# Patient Record
Sex: Female | Born: 1960 | Race: White | Hispanic: No | Marital: Married | Smoking: Never smoker
Health system: Southern US, Community
[De-identification: ages and names within clinical notes are randomized; demographics above are authoritative.]

## PROBLEM LIST (undated history)

## (undated) DIAGNOSIS — C439 Malignant melanoma of skin, unspecified: Secondary | ICD-10-CM

## (undated) DIAGNOSIS — K5790 Diverticulosis of intestine, part unspecified, without perforation or abscess without bleeding: Secondary | ICD-10-CM

## (undated) DIAGNOSIS — E282 Polycystic ovarian syndrome: Secondary | ICD-10-CM

## (undated) DIAGNOSIS — E785 Hyperlipidemia, unspecified: Secondary | ICD-10-CM

## (undated) DIAGNOSIS — R609 Edema, unspecified: Secondary | ICD-10-CM

## (undated) DIAGNOSIS — D696 Thrombocytopenia, unspecified: Secondary | ICD-10-CM

## (undated) DIAGNOSIS — D649 Anemia, unspecified: Secondary | ICD-10-CM

## (undated) DIAGNOSIS — K224 Dyskinesia of esophagus: Secondary | ICD-10-CM

## (undated) DIAGNOSIS — K829 Disease of gallbladder, unspecified: Secondary | ICD-10-CM

## (undated) DIAGNOSIS — C4491 Basal cell carcinoma of skin, unspecified: Secondary | ICD-10-CM

## (undated) DIAGNOSIS — F419 Anxiety disorder, unspecified: Secondary | ICD-10-CM

## (undated) DIAGNOSIS — K219 Gastro-esophageal reflux disease without esophagitis: Secondary | ICD-10-CM

## (undated) DIAGNOSIS — K589 Irritable bowel syndrome without diarrhea: Secondary | ICD-10-CM

## (undated) DIAGNOSIS — M549 Dorsalgia, unspecified: Secondary | ICD-10-CM

## (undated) DIAGNOSIS — R32 Unspecified urinary incontinence: Secondary | ICD-10-CM

## (undated) DIAGNOSIS — M722 Plantar fascial fibromatosis: Secondary | ICD-10-CM

## (undated) HISTORY — DX: Hyperlipidemia, unspecified: E78.5

## (undated) HISTORY — DX: Polycystic ovarian syndrome: E28.2

## (undated) HISTORY — DX: Disease of gallbladder, unspecified: K82.9

## (undated) HISTORY — PX: MELANOMA EXCISION: SHX5266

## (undated) HISTORY — DX: Diverticulosis of intestine, part unspecified, without perforation or abscess without bleeding: K57.90

## (undated) HISTORY — DX: Thrombocytopenia, unspecified: D69.6

## (undated) HISTORY — DX: Malignant melanoma of skin, unspecified: C43.9

## (undated) HISTORY — DX: Irritable bowel syndrome, unspecified: K58.9

## (undated) HISTORY — PX: TONSILLECTOMY: SUR1361

## (undated) HISTORY — DX: Gastro-esophageal reflux disease without esophagitis: K21.9

## (undated) HISTORY — DX: Edema, unspecified: R60.9

## (undated) HISTORY — DX: Plantar fascial fibromatosis: M72.2

## (undated) HISTORY — PX: SHOULDER SURGERY: SHX246

## (undated) HISTORY — DX: Unspecified urinary incontinence: R32

## (undated) HISTORY — PX: COLONOSCOPY: SHX174

## (undated) HISTORY — DX: Dyskinesia of esophagus: K22.4

## (undated) HISTORY — DX: Basal cell carcinoma of skin, unspecified: C44.91

## (undated) HISTORY — DX: Dorsalgia, unspecified: M54.9

## (undated) HISTORY — DX: Anxiety disorder, unspecified: F41.9

## (undated) HISTORY — DX: Anemia, unspecified: D64.9

---

## 1989-02-03 HISTORY — PX: TOTAL ABDOMINAL HYSTERECTOMY: SHX209

## 1999-01-26 ENCOUNTER — Encounter: Payer: Self-pay | Admitting: Emergency Medicine

## 1999-01-26 ENCOUNTER — Emergency Department (HOSPITAL_COMMUNITY): Admission: EM | Admit: 1999-01-26 | Discharge: 1999-01-26 | Payer: Self-pay | Admitting: Emergency Medicine

## 1999-01-27 ENCOUNTER — Ambulatory Visit (HOSPITAL_COMMUNITY): Admission: RE | Admit: 1999-01-27 | Discharge: 1999-01-27 | Payer: Self-pay | Admitting: Emergency Medicine

## 1999-01-27 ENCOUNTER — Encounter: Payer: Self-pay | Admitting: Emergency Medicine

## 1999-03-12 ENCOUNTER — Encounter: Payer: Self-pay | Admitting: Gastroenterology

## 1999-03-12 ENCOUNTER — Ambulatory Visit (HOSPITAL_COMMUNITY): Admission: RE | Admit: 1999-03-12 | Discharge: 1999-03-12 | Payer: Self-pay | Admitting: Gastroenterology

## 1999-04-12 ENCOUNTER — Ambulatory Visit (HOSPITAL_COMMUNITY): Admission: RE | Admit: 1999-04-12 | Discharge: 1999-04-12 | Payer: Self-pay | Admitting: Family Medicine

## 1999-04-12 ENCOUNTER — Encounter: Payer: Self-pay | Admitting: Family Medicine

## 2000-02-04 DIAGNOSIS — D696 Thrombocytopenia, unspecified: Secondary | ICD-10-CM

## 2000-02-04 HISTORY — PX: CHOLECYSTECTOMY: SHX55

## 2000-02-04 HISTORY — DX: Thrombocytopenia, unspecified: D69.6

## 2000-06-29 ENCOUNTER — Emergency Department (HOSPITAL_COMMUNITY): Admission: EM | Admit: 2000-06-29 | Discharge: 2000-06-29 | Payer: Self-pay | Admitting: Emergency Medicine

## 2000-06-29 ENCOUNTER — Encounter: Payer: Self-pay | Admitting: Emergency Medicine

## 2000-07-10 ENCOUNTER — Ambulatory Visit (HOSPITAL_COMMUNITY): Admission: RE | Admit: 2000-07-10 | Discharge: 2000-07-11 | Payer: Self-pay | Admitting: *Deleted

## 2000-07-10 ENCOUNTER — Encounter (INDEPENDENT_AMBULATORY_CARE_PROVIDER_SITE_OTHER): Payer: Self-pay | Admitting: *Deleted

## 2001-07-14 ENCOUNTER — Other Ambulatory Visit: Admission: RE | Admit: 2001-07-14 | Discharge: 2001-07-14 | Payer: Self-pay | Admitting: Obstetrics & Gynecology

## 2002-02-03 DIAGNOSIS — D649 Anemia, unspecified: Secondary | ICD-10-CM

## 2002-02-03 HISTORY — DX: Anemia, unspecified: D64.9

## 2004-02-04 DIAGNOSIS — C439 Malignant melanoma of skin, unspecified: Secondary | ICD-10-CM

## 2004-02-04 HISTORY — DX: Malignant melanoma of skin, unspecified: C43.9

## 2004-04-19 ENCOUNTER — Ambulatory Visit: Payer: Self-pay | Admitting: Internal Medicine

## 2004-10-08 ENCOUNTER — Ambulatory Visit: Payer: Self-pay | Admitting: Internal Medicine

## 2004-12-18 ENCOUNTER — Ambulatory Visit (HOSPITAL_COMMUNITY): Admission: RE | Admit: 2004-12-18 | Discharge: 2004-12-18 | Payer: Self-pay | Admitting: General Surgery

## 2004-12-18 ENCOUNTER — Ambulatory Visit (HOSPITAL_BASED_OUTPATIENT_CLINIC_OR_DEPARTMENT_OTHER): Admission: RE | Admit: 2004-12-18 | Discharge: 2004-12-18 | Payer: Self-pay | Admitting: General Surgery

## 2004-12-18 ENCOUNTER — Encounter (INDEPENDENT_AMBULATORY_CARE_PROVIDER_SITE_OTHER): Payer: Self-pay | Admitting: *Deleted

## 2005-01-17 ENCOUNTER — Ambulatory Visit: Payer: Self-pay | Admitting: Oncology

## 2005-04-30 ENCOUNTER — Ambulatory Visit: Payer: Self-pay | Admitting: Internal Medicine

## 2005-05-19 ENCOUNTER — Encounter: Payer: Self-pay | Admitting: Internal Medicine

## 2005-06-23 ENCOUNTER — Ambulatory Visit: Payer: Self-pay | Admitting: Internal Medicine

## 2005-07-14 ENCOUNTER — Ambulatory Visit: Payer: Self-pay | Admitting: Oncology

## 2005-07-16 LAB — COMPREHENSIVE METABOLIC PANEL
ALT: 8 U/L (ref 0–40)
CO2: 29 mEq/L (ref 19–32)
Calcium: 8.9 mg/dL (ref 8.4–10.5)
Chloride: 102 mEq/L (ref 96–112)
Creatinine, Ser: 0.59 mg/dL (ref 0.40–1.20)
Glucose, Bld: 61 mg/dL — ABNORMAL LOW (ref 70–99)
Total Protein: 6.9 g/dL (ref 6.0–8.3)

## 2005-07-16 LAB — CBC WITH DIFFERENTIAL/PLATELET
BASO%: 0.6 % (ref 0.0–2.0)
Eosinophils Absolute: 0.2 10*3/uL (ref 0.0–0.5)
HCT: 42.1 % (ref 34.8–46.6)
HGB: 14.1 g/dL (ref 11.6–15.9)
MCHC: 33.6 g/dL (ref 32.0–36.0)
MONO#: 0.4 10*3/uL (ref 0.1–0.9)
NEUT#: 4.3 10*3/uL (ref 1.5–6.5)
NEUT%: 62.7 % (ref 39.6–76.8)
WBC: 6.9 10*3/uL (ref 3.9–10.0)
lymph#: 1.9 10*3/uL (ref 0.9–3.3)

## 2005-07-16 LAB — LACTATE DEHYDROGENASE: LDH: 102 U/L (ref 94–250)

## 2005-10-09 ENCOUNTER — Ambulatory Visit: Payer: Self-pay | Admitting: Internal Medicine

## 2005-11-21 ENCOUNTER — Ambulatory Visit: Payer: Self-pay | Admitting: Oncology

## 2005-11-25 LAB — COMPREHENSIVE METABOLIC PANEL
Alkaline Phosphatase: 36 U/L — ABNORMAL LOW (ref 39–117)
BUN: 11 mg/dL (ref 6–23)
CO2: 28 mEq/L (ref 19–32)
Creatinine, Ser: 0.62 mg/dL (ref 0.40–1.20)
Glucose, Bld: 61 mg/dL — ABNORMAL LOW (ref 70–99)
Total Bilirubin: 0.3 mg/dL (ref 0.3–1.2)

## 2005-11-25 LAB — CBC WITH DIFFERENTIAL/PLATELET
Basophils Absolute: 0.1 10*3/uL (ref 0.0–0.1)
Eosinophils Absolute: 0.2 10*3/uL (ref 0.0–0.5)
HCT: 40.2 % (ref 34.8–46.6)
HGB: 13.6 g/dL (ref 11.6–15.9)
LYMPH%: 22.8 % (ref 14.0–48.0)
MONO#: 0.4 10*3/uL (ref 0.1–0.9)
NEUT#: 4.8 10*3/uL (ref 1.5–6.5)
NEUT%: 67.7 % (ref 39.6–76.8)
Platelets: 189 10*3/uL (ref 145–400)
WBC: 7 10*3/uL (ref 3.9–10.0)
lymph#: 1.6 10*3/uL (ref 0.9–3.3)

## 2005-11-25 LAB — LACTATE DEHYDROGENASE: LDH: 90 U/L — ABNORMAL LOW (ref 94–250)

## 2006-09-14 ENCOUNTER — Ambulatory Visit: Payer: Self-pay | Admitting: Oncology

## 2006-09-17 LAB — CBC WITH DIFFERENTIAL/PLATELET
BASO%: 0.5 % (ref 0.0–2.0)
EOS%: 3.2 % (ref 0.0–7.0)
HGB: 14.6 g/dL (ref 11.6–15.9)
MCH: 30.7 pg (ref 26.0–34.0)
MCHC: 35 g/dL (ref 32.0–36.0)
RDW: 13.4 % (ref 11.3–14.5)
lymph#: 1.6 10*3/uL (ref 0.9–3.3)

## 2006-09-17 LAB — LACTATE DEHYDROGENASE: LDH: 110 U/L (ref 94–250)

## 2006-09-17 LAB — COMPREHENSIVE METABOLIC PANEL
ALT: 10 U/L (ref 0–35)
CO2: 26 mEq/L (ref 19–32)
Calcium: 8.7 mg/dL (ref 8.4–10.5)
Chloride: 103 mEq/L (ref 96–112)
Creatinine, Ser: 0.61 mg/dL (ref 0.40–1.20)
Glucose, Bld: 86 mg/dL (ref 70–99)
Total Bilirubin: 0.3 mg/dL (ref 0.3–1.2)

## 2006-10-28 ENCOUNTER — Ambulatory Visit: Payer: Self-pay | Admitting: Internal Medicine

## 2006-10-28 DIAGNOSIS — K219 Gastro-esophageal reflux disease without esophagitis: Secondary | ICD-10-CM | POA: Insufficient documentation

## 2006-10-28 DIAGNOSIS — C439 Malignant melanoma of skin, unspecified: Secondary | ICD-10-CM | POA: Insufficient documentation

## 2006-10-28 DIAGNOSIS — D649 Anemia, unspecified: Secondary | ICD-10-CM | POA: Insufficient documentation

## 2006-10-28 DIAGNOSIS — E7800 Pure hypercholesterolemia, unspecified: Secondary | ICD-10-CM | POA: Insufficient documentation

## 2006-10-28 DIAGNOSIS — Z86006 Personal history of melanoma in-situ: Secondary | ICD-10-CM | POA: Insufficient documentation

## 2006-10-28 DIAGNOSIS — E782 Mixed hyperlipidemia: Secondary | ICD-10-CM | POA: Insufficient documentation

## 2006-10-30 ENCOUNTER — Encounter (INDEPENDENT_AMBULATORY_CARE_PROVIDER_SITE_OTHER): Payer: Self-pay | Admitting: *Deleted

## 2006-11-19 ENCOUNTER — Ambulatory Visit: Payer: Self-pay | Admitting: Internal Medicine

## 2006-11-20 ENCOUNTER — Encounter (INDEPENDENT_AMBULATORY_CARE_PROVIDER_SITE_OTHER): Payer: Self-pay | Admitting: *Deleted

## 2006-12-29 ENCOUNTER — Encounter: Payer: Self-pay | Admitting: Internal Medicine

## 2007-04-22 ENCOUNTER — Ambulatory Visit: Payer: Self-pay | Admitting: Internal Medicine

## 2007-04-23 ENCOUNTER — Ambulatory Visit: Payer: Self-pay | Admitting: Oncology

## 2007-04-27 ENCOUNTER — Encounter: Payer: Self-pay | Admitting: Internal Medicine

## 2007-04-28 ENCOUNTER — Ambulatory Visit (HOSPITAL_COMMUNITY): Admission: RE | Admit: 2007-04-28 | Discharge: 2007-04-28 | Payer: Self-pay | Admitting: Hematology & Oncology

## 2007-05-03 ENCOUNTER — Encounter (INDEPENDENT_AMBULATORY_CARE_PROVIDER_SITE_OTHER): Payer: Self-pay | Admitting: *Deleted

## 2007-05-31 ENCOUNTER — Telehealth (INDEPENDENT_AMBULATORY_CARE_PROVIDER_SITE_OTHER): Payer: Self-pay | Admitting: *Deleted

## 2007-08-25 ENCOUNTER — Encounter (INDEPENDENT_AMBULATORY_CARE_PROVIDER_SITE_OTHER): Payer: Self-pay | Admitting: *Deleted

## 2007-09-27 ENCOUNTER — Ambulatory Visit: Payer: Self-pay | Admitting: Oncology

## 2008-01-20 ENCOUNTER — Ambulatory Visit: Payer: Self-pay | Admitting: Internal Medicine

## 2008-01-20 DIAGNOSIS — Z85828 Personal history of other malignant neoplasm of skin: Secondary | ICD-10-CM

## 2008-01-20 DIAGNOSIS — K5730 Diverticulosis of large intestine without perforation or abscess without bleeding: Secondary | ICD-10-CM | POA: Insufficient documentation

## 2008-01-27 ENCOUNTER — Encounter (INDEPENDENT_AMBULATORY_CARE_PROVIDER_SITE_OTHER): Payer: Self-pay | Admitting: *Deleted

## 2008-01-27 ENCOUNTER — Telehealth (INDEPENDENT_AMBULATORY_CARE_PROVIDER_SITE_OTHER): Payer: Self-pay | Admitting: *Deleted

## 2008-02-02 ENCOUNTER — Telehealth (INDEPENDENT_AMBULATORY_CARE_PROVIDER_SITE_OTHER): Payer: Self-pay | Admitting: *Deleted

## 2008-02-24 ENCOUNTER — Encounter (INDEPENDENT_AMBULATORY_CARE_PROVIDER_SITE_OTHER): Payer: Self-pay | Admitting: *Deleted

## 2008-02-24 ENCOUNTER — Ambulatory Visit: Payer: Self-pay | Admitting: Internal Medicine

## 2008-02-24 LAB — CONVERTED CEMR LAB
OCCULT 1: NEGATIVE
OCCULT 2: NEGATIVE

## 2008-03-13 ENCOUNTER — Ambulatory Visit: Payer: Self-pay | Admitting: Oncology

## 2008-03-30 ENCOUNTER — Ambulatory Visit: Payer: Self-pay | Admitting: Internal Medicine

## 2008-03-30 ENCOUNTER — Telehealth: Payer: Self-pay | Admitting: Internal Medicine

## 2008-04-10 LAB — CONVERTED CEMR LAB
ALT: 13 units/L (ref 0–35)
AST: 15 units/L (ref 0–37)
Bilirubin, Direct: 0.1 mg/dL (ref 0.0–0.3)
Total Bilirubin: 0.7 mg/dL (ref 0.3–1.2)
VLDL: 21 mg/dL (ref 0–40)

## 2008-04-11 ENCOUNTER — Encounter (INDEPENDENT_AMBULATORY_CARE_PROVIDER_SITE_OTHER): Payer: Self-pay | Admitting: *Deleted

## 2008-06-19 ENCOUNTER — Ambulatory Visit: Payer: Self-pay | Admitting: Internal Medicine

## 2008-06-19 ENCOUNTER — Encounter (INDEPENDENT_AMBULATORY_CARE_PROVIDER_SITE_OTHER): Payer: Self-pay | Admitting: *Deleted

## 2008-06-19 DIAGNOSIS — M545 Low back pain: Secondary | ICD-10-CM

## 2008-06-19 LAB — CONVERTED CEMR LAB
Bilirubin Urine: NEGATIVE
Glucose, Urine, Semiquant: NEGATIVE
Protein, U semiquant: NEGATIVE
Urobilinogen, UA: 0.2

## 2008-06-20 ENCOUNTER — Encounter (INDEPENDENT_AMBULATORY_CARE_PROVIDER_SITE_OTHER): Payer: Self-pay | Admitting: *Deleted

## 2008-06-20 ENCOUNTER — Telehealth: Payer: Self-pay | Admitting: Internal Medicine

## 2008-06-21 ENCOUNTER — Encounter: Admission: RE | Admit: 2008-06-21 | Discharge: 2008-08-15 | Payer: Self-pay | Admitting: Internal Medicine

## 2008-06-27 ENCOUNTER — Encounter: Payer: Self-pay | Admitting: Internal Medicine

## 2008-07-10 ENCOUNTER — Telehealth: Payer: Self-pay | Admitting: Internal Medicine

## 2009-01-22 ENCOUNTER — Ambulatory Visit: Payer: Self-pay | Admitting: Internal Medicine

## 2009-01-29 ENCOUNTER — Encounter (INDEPENDENT_AMBULATORY_CARE_PROVIDER_SITE_OTHER): Payer: Self-pay | Admitting: *Deleted

## 2009-03-13 ENCOUNTER — Ambulatory Visit: Payer: Self-pay | Admitting: Oncology

## 2009-03-15 ENCOUNTER — Ambulatory Visit (HOSPITAL_COMMUNITY): Admission: RE | Admit: 2009-03-15 | Discharge: 2009-03-15 | Payer: Self-pay | Admitting: Oncology

## 2009-03-15 LAB — COMPREHENSIVE METABOLIC PANEL
AST: 14 U/L (ref 0–37)
BUN: 8 mg/dL (ref 6–23)
CO2: 30 mEq/L (ref 19–32)
Calcium: 8.8 mg/dL (ref 8.4–10.5)
Chloride: 102 mEq/L (ref 96–112)
Potassium: 4 mEq/L (ref 3.5–5.3)

## 2009-03-15 LAB — CBC WITH DIFFERENTIAL/PLATELET
BASO%: 0.3 % (ref 0.0–2.0)
Basophils Absolute: 0 10*3/uL (ref 0.0–0.1)
EOS%: 1.8 % (ref 0.0–7.0)
Eosinophils Absolute: 0.2 10*3/uL (ref 0.0–0.5)
HGB: 15.1 g/dL (ref 11.6–15.9)
MCH: 30.7 pg (ref 25.1–34.0)
MCHC: 33.8 g/dL (ref 31.5–36.0)
MCV: 90.7 fL (ref 79.5–101.0)
MONO#: 0.5 10*3/uL (ref 0.1–0.9)
MONO%: 5.5 % (ref 0.0–14.0)
Platelets: 207 10*3/uL (ref 145–400)
RBC: 4.91 10*6/uL (ref 3.70–5.45)
WBC: 9.4 10*3/uL (ref 3.9–10.3)

## 2009-03-15 LAB — LACTATE DEHYDROGENASE: LDH: 93 U/L — ABNORMAL LOW (ref 94–250)

## 2009-03-26 ENCOUNTER — Ambulatory Visit: Payer: Self-pay | Admitting: Internal Medicine

## 2009-03-26 LAB — CONVERTED CEMR LAB
Inflenza A Ag: POSITIVE
Influenza B Ag: NEGATIVE

## 2009-03-29 ENCOUNTER — Telehealth: Payer: Self-pay | Admitting: Family Medicine

## 2009-04-06 ENCOUNTER — Telehealth: Payer: Self-pay | Admitting: Internal Medicine

## 2009-06-20 ENCOUNTER — Telehealth: Payer: Self-pay | Admitting: Internal Medicine

## 2009-06-21 ENCOUNTER — Telehealth (INDEPENDENT_AMBULATORY_CARE_PROVIDER_SITE_OTHER): Payer: Self-pay | Admitting: *Deleted

## 2009-10-02 ENCOUNTER — Telehealth (INDEPENDENT_AMBULATORY_CARE_PROVIDER_SITE_OTHER): Payer: Self-pay | Admitting: *Deleted

## 2009-10-23 ENCOUNTER — Ambulatory Visit: Payer: Self-pay | Admitting: Internal Medicine

## 2009-11-05 ENCOUNTER — Telehealth (INDEPENDENT_AMBULATORY_CARE_PROVIDER_SITE_OTHER): Payer: Self-pay | Admitting: *Deleted

## 2009-11-06 ENCOUNTER — Encounter: Payer: Self-pay | Admitting: Internal Medicine

## 2010-03-03 LAB — CONVERTED CEMR LAB
ALT: 13 units/L (ref 0–35)
ALT: 15 units/L (ref 0–35)
ALT: 20 units/L (ref 0–35)
AST: 15 units/L (ref 0–37)
AST: 16 units/L (ref 0–37)
Alkaline Phosphatase: 40 units/L (ref 39–117)
BUN: 10 mg/dL (ref 6–23)
BUN: 8 mg/dL (ref 6–23)
Basophils Absolute: 0.1 10*3/uL (ref 0.0–0.1)
Basophils Absolute: 0.1 10*3/uL (ref 0.0–0.1)
Basophils Relative: 0.6 % (ref 0.0–1.0)
Bilirubin, Direct: 0 mg/dL (ref 0.0–0.3)
Bilirubin, Direct: 0.1 mg/dL (ref 0.0–0.3)
Bilirubin, Direct: 0.1 mg/dL (ref 0.0–0.3)
CO2: 25 meq/L (ref 19–32)
CO2: 33 meq/L — ABNORMAL HIGH (ref 19–32)
Calcium: 9.1 mg/dL (ref 8.4–10.5)
Calcium: 9.2 mg/dL (ref 8.4–10.5)
Chloride: 100 meq/L (ref 96–112)
Chloride: 101 meq/L (ref 96–112)
Chloride: 105 meq/L (ref 96–112)
Cholesterol, target level: 200 mg/dL
Cholesterol: 183 mg/dL (ref 0–200)
Cholesterol: 223 mg/dL (ref 0–200)
Creatinine, Ser: 0.5 mg/dL (ref 0.4–1.2)
Creatinine, Ser: 0.57 mg/dL (ref 0.40–1.20)
Creatinine, Ser: 0.6 mg/dL (ref 0.4–1.2)
Direct LDL: 156.9 mg/dL
Eosinophils Absolute: 0.1 10*3/uL (ref 0.0–0.7)
Eosinophils Absolute: 0.2 10*3/uL (ref 0.0–0.6)
Eosinophils Relative: 2.5 % (ref 0.0–5.0)
Folate: >20 ng/mL
Glucose, Bld: 92 mg/dL (ref 70–99)
HCT: 42.4 % (ref 36.0–46.0)
HCT: 42.7 % (ref 36.0–46.0)
HCT: 43.7 % (ref 36.0–46.0)
HDL: 41.2 mg/dL (ref >39.0)
HDL: 43 mg/dL (ref >39)
Hemoglobin: 14.7 g/dL (ref 12.0–15.0)
Hemoglobin: 14.8 g/dL (ref 12.0–15.0)
Indirect Bilirubin: 0.3 mg/dL (ref 0.0–0.9)
Iron: 65 ug/dL (ref 42–145)
LDL Cholesterol: 120 mg/dL — ABNORMAL HIGH (ref 0–99)
LDL Cholesterol: 126 mg/dL — ABNORMAL HIGH (ref 0–99)
LDL Goal: 110 mg/dL
Lymphocytes Relative: 21 % (ref 12–46)
Lymphocytes Relative: 23.9 % (ref 12.0–46.0)
Lymphs Abs: 1.8 10*3/uL (ref 0.7–4.0)
MCHC: 33.7 g/dL (ref 30.0–36.0)
MCHC: 34.1 g/dL (ref 30.0–36.0)
MCV: 90.9 fL (ref 78.0–100.0)
MCV: 92.5 fL (ref 78.0–100.0)
Monocytes Absolute: 0.4 10*3/uL (ref 0.1–1.0)
Monocytes Absolute: 0.4 10*3/uL (ref 0.1–1.0)
Monocytes Absolute: 0.4 10*3/uL (ref 0.1–1.0)
Monocytes Relative: 5.4 % (ref 3.0–12.0)
Monocytes Relative: 6 % (ref 3–12)
Neutro Abs: 4.5 10*3/uL (ref 1.4–7.7)
Neutro Abs: 5.2 10*3/uL (ref 1.4–7.7)
Neutro Abs: 5.2 10*3/uL (ref 1.7–7.7)
Neutrophils Relative %: 58.6 % (ref 43.0–77.0)
Platelets: 207 10*3/uL (ref 150–400)
Potassium: 3.6 meq/L (ref 3.5–5.1)
Potassium: 3.9 meq/L (ref 3.5–5.3)
RBC: 4.69 M/uL (ref 3.87–5.11)
RBC: 4.72 M/uL (ref 3.87–5.11)
RBC: 4.92 M/uL (ref 3.87–5.11)
RDW: 13.3 % (ref 11.5–14.6)
Saturation Ratios: 21.8 % (ref 20.0–50.0)
TSH: 1.5 microintl units/mL (ref 0.35–5.50)
Total Bilirubin: 0.6 mg/dL (ref 0.3–1.2)
Total CHOL/HDL Ratio: 4
Total CHOL/HDL Ratio: 5.6
Total Protein: 6.6 g/dL (ref 6.0–8.3)
Total Protein: 6.7 g/dL (ref 6.0–8.3)
Total Protein: 6.8 g/dL (ref 6.0–8.3)
Transferrin: 212.9 mg/dL (ref 212.0–360.0)
Triglycerides: 72 mg/dL (ref 0.0–149.0)
Triglycerides: 77 mg/dL (ref 0–149)
VLDL: 15 mg/dL (ref 0–40)
VLDL: 24 mg/dL (ref 0–40)
WBC: 6.7 10*3/uL (ref 4.5–10.5)

## 2010-03-05 NOTE — Progress Notes (Signed)
Summary: still no better  Phone Note Call from Patient Call back at Work Phone [phone] Call back at ext [phone]   Caller: Patient Summary of Call: Patient states she was seen in the office on Monday. Dr. Alwyn Ren prescribe tamiflu patients states she is still having congestion. Patient wants to know if he will prescribe something else.Please advise Initial call taken by: Barb Merino,  March 29, 2009 10:26 AM  Follow-up for Phone Call        pt still c/o head congestion, headache, drainage, sore throat, sinus pressure. pt states that breathing has improved some with inhaler. pt denies fever, SOB. pt use [employer]. pls advise in absent of dr hopper........Marland KitchenFelecia Deloach CMA  March 29, 2009 11:24 AM   Additional Follow-up for Phone Call Additional follow up Details #1::        augmentin 875 two times a day for 10 day  Additional Follow-up by: Loreen Freud DO,  March 29, 2009 11:27 AM    Additional Follow-up for Phone Call Additional follow up Details #2::    pt aware...........Marland KitchenFelecia Deloach CMA  March 29, 2009 11:47 AM   New/Updated Medications: AUGMENTIN 875-125 MG TABS (AMOXICILLIN-POT CLAVULANATE) Take 1 tab two times a day for 10 day Prescriptions: AUGMENTIN 875-125 MG TABS (AMOXICILLIN-POT CLAVULANATE) Take 1 tab two times a day for 10 day  #20 x 0   Entered by:   Jeremy Johann CMA   Authorized by:   Loreen Freud DO   Signed by:   Jeremy Johann CMA on 03/29/2009   Method used:   Faxed to ...       [facility] [phone]* (retail)       [address] [place], [place]  [address]       Ph: [phone] or [phone]       Fax: [phone]   RxID:   [id number]

## 2010-03-05 NOTE — Assessment & Plan Note (Signed)
Summary: flu symptoms// a little sob//lch   Vital Signs:  Patient profile:   50 year old female Weight:      180.2 pounds O2 Sat:      97 % Temp:     99.0 degrees F oral Pulse rate:   90 / minute Resp:     15 per minute BP sitting:   110 / 70  (left arm) Cuff size:   large  Vitals Entered By: Shonna Chock (March 26, 2009 12:24 PM) CC: Flu like symptoms: SOB, body aches,low-high fever, chills,chest congestion, productive cough (discolored) Comments REVIEWED MED LIST, PATIENT AGREED DOSE AND INSTRUCTION CORRECT    CC:  Flu like symptoms: SOB, body aches, low-high fever, chills, chest congestion, and productive cough (discolored).  History of Present Illness: Onset 03/22/2009 as tickle in throat; Fri 02/18 hoareseness followed by  temp to 100.6, head congestion  then chest with SOB. Rx: OTC Tylenol, NSAIDS, Robitussin CF. Flu shot last Fall   Allergies (verified): No Known Drug Allergies  Review of Systems General:  Complains of fever and sweats; denies chills. ENT:  Complains of sore throat; denies nasal congestion and sinus pressure; Frontal headache ; purulence ; no facial pain. Resp:  Complains of cough, sputum productive, and wheezing; No PMH of asthma. Allergy:  Complains of sneezing; denies itching eyes.  Physical Exam  General:  well-nourished,in no acute distress; alert,appropriate and cooperative throughout examination Ears:  External ear exam shows no significant lesions or deformities.  Otoscopic examination reveals clear canals, tympanic membranes are intact bilaterally without bulging, retraction, inflammation or discharge. Hearing is grossly normal bilaterally. Nose:  External nasal examination shows no deformity or inflammation. Nasal mucosa are pink and moist without lesions or exudates. Mouth:  Oral mucosa and oropharynx without lesions or exudates.  Teeth in good repair. Lungs:  Normal respiratory effort, chest expands symmetrically. Lungs: mild rhonchi  symmetrically. Raspy cough Skin:  damp Cervical Nodes:  No lymphadenopathy noted Axillary Nodes:  No palpable lymphadenopathy   Impression & Recommendations:  Problem # 1:  BRONCHITIS-ACUTE (ICD-466.0)  + Influenza A  Her updated medication list for this problem includes:    Advair Diskus 100-50 Mcg/dose Aepb (Fluticasone-salmeterol) .[name]... 1 inhalation every every 12 hrs  Problem # 2:  URI (ICD-465.9)  Her updated medication list for this problem includes:    Meloxicam 7.5 Mg Tabs (Meloxicam) .[name]... 1 two times a day as needed joint pain  Complete Medication List: 1)  Nexium 40 Mg Cpdr (Esomeprazole magnesium) .[name].. 1 by mouth qd 2)  Celexa 40 Mg Tabs (Citalopram hydrobromide) .[name].. 1 by mouth qd 3)  Xanax 0.25 Mg Tabs (Alprazolam) .... 1/2 by mouth bid 4)  Multivitamin  5)  Calcium  6)  Fish Oil  7)  Detrol La 4 Mg Xr24h-cap (Tolterodine tartrate) .[name].. 1 by mouth once daily 8)  Pravastatin Sodium 20 Mg Tabs (Pravastatin sodium) .[name].. 1 at bedtime 9)  Meloxicam 7.5 Mg Tabs (Meloxicam) .[name].. 1 two times a day as needed joint pain 10)  Tamiflu 75 Mg Caps (Oseltamivir phosphate) .[name].. 1 two times a day 11)  Advair Diskus 100-50 Mcg/dose Aepb (Fluticasone-salmeterol) .[name].. 1 inhalation every every 12 hrs  Other Orders: Flu A+B ([address])  Patient Instructions: 1)  Drink as much fluid as you can tolerate for the next few days. 2)  Take 650-1000mg  of Tylenol every 4-6 hours as needed for relief of pain or comfort of fever AVOID taking more than 4000mg   in a 24 hour period (can  cause liver damage in higher doses) OR take 400-600mg  of Ibuprofen (Advil, Motrin) with food every 4-6 hours as needed for relief of pain or comfort of fever.Fill cough syrup if the Advair doesn't control cough 3)  Recommended remaining out of work for 02/21& 03/27/2009. Prescriptions: ADVAIR DISKUS 100-50 MCG/DOSE AEPB (FLUTICASONE-SALMETEROL) 1 inhalation every every 12 hrs  #14 doses x 0   Entered and Authorized  by:   Marga Melnick MD   Signed by:   Marga Melnick MD on 03/26/2009   Method used:   Historical   RxID:   [id number] TAMIFLU 75 MG CAPS (OSELTAMIVIR PHOSPHATE) 1 two times a day  #10 x 0   Entered and Authorized by:   Marga Melnick MD   Signed by:   Marga Melnick MD on 03/26/2009   Method used:   Faxed to ...       [facility] [phone]* (retail)       [address] [place], [place]  [address]       Ph: [phone] or [phone]       Fax: [phone]   RxID:   [id number]   Laboratory Results    Other Tests  Influenza A: positive Influenza B: negative

## 2010-03-05 NOTE — Progress Notes (Signed)
Summary: med change   Phone Note Call from Patient Call back at Work Phone [phone] Call back at ext [phone]   Caller: Patient Summary of Call: pt would like to change NEXIUM 40 MG to the omeprazole with a 90-day supply. Pt would also like to know if there is a cheaper med then detrol LA.[facility] [place].pls advise..........Marland KitchenFelecia Deloach CMA  Jun 20, 2009 11:34 AM   Follow-up for Phone Call        Omeprazole 20 mg #90 Ok. She'll have to check with her Pharmacist as to less expensive choices on her Formulary Follow-up by: Marga Melnick MD,  Jun 20, 2009 11:49 AM  Additional Follow-up for Phone Call Additional follow up Details #1::        pt aware rx sent to pharmacy and to check with  Pharmacist as to less expensive choices on her Formulary..................Marland KitchenFelecia Deloach CMA  Jun 20, 2009 11:56 AM     New/Updated Medications: OMEPRAZOLE 20 MG TBEC (OMEPRAZOLE) Take 1 tab once daily Prescriptions: OMEPRAZOLE 20 MG TBEC (OMEPRAZOLE) Take 1 tab once daily  #90 x 0   Entered by:   Jeremy Johann CMA   Authorized by:   Marga Melnick MD   Signed by:   Jeremy Johann CMA on 06/20/2009   Method used:   Faxed to ...       [facility] [phone]* (retail)       [address] [place], [place]  [address]       Ph: [phone] or [phone]       Fax: [phone]   RxID:   [id number]

## 2010-03-05 NOTE — Progress Notes (Signed)
Summary: prior [facility]  Phone Note Refill Request Message from:  Fax from Pharmacy on November 05, 2009 11:15 AM  Refills Requested: Medication #1:  NEXIUM 40 MG CPDR 1 once daily. prior Berkley Harvey -- [phone] id [id number] --------[facility] [place] fax [phone]  Initial call taken by: Dannielle Karvonen,  November 05, 2009 11:16 AM  Follow-up for Phone Call        awaiting  fax case [phone]............Marland KitchenFelecia Deloach CMA  November 06, 2009 8:57 AM  prior auth faxed back awaiting response..........Marland KitchenFelecia Deloach CMA  November 06, 2009 10:51 AM   Additional Follow-up for Phone Call Additional follow up Details #1::        Prior auth approved 11-06-10 or until coverage for the medication is no longer available under benefit plan or the medication becomes subject to a pharmacy benefit coverage requirement, such as supply limits or notification whichever occurs first.Pharmacy faxed, approval letter scan to chart........Marland KitchenFelecia Deloach CMA  November 07, 2009 8:42 AM

## 2010-03-05 NOTE — Medication Information (Signed)
Summary: Prior Authorization & Approval for Nexium/[employer]  Prior Authorization & Approval for Nexium/[employer]   Imported By: Lanelle Bal 11/14/2009 10:28:59  _____________________________________________________________________  External Attachment:    Type:   Image     Comment:   External Document

## 2010-03-05 NOTE — Assessment & Plan Note (Signed)
Summary: CPX/NS/KDC   Vital Signs:  Patient profile:   50 year old female Height:      63.5 inches Weight:      180.6 pounds BMI:     31.60 Temp:     98.1 degrees F oral Pulse rate:   72 / minute Resp:     14 per minute BP sitting:   118 / 68  (left arm) Cuff size:   large  Vitals Entered By: Shonna Chock CMA (October 23, 2009 8:29 AM)    History of Present Illness: Kristin Rasmussen is here for a physical; the Omeprazole is not working as well as Nexium. Copay is $60 for Nexium through [facility]. The patient reports acid reflux and weight gain of 8-10 # , but denies sour taste in mouth, epigastric pain, chest pain, and trouble swallowing.  The patient denies the following alarm features: melena, dysphagia, hematemesis, and vomiting.  Symptoms are worse with spicy foods, citrus,tomatoes  and lying down.  She has increased post meal bloating. Life stressors may be contributing to ERD symptoms.  Lipid Management History:      Positive NCEP/ATP III risk factors include family history for ischemic heart disease (females less than 28 years old & males less than 23 years old).  Negative NCEP/ATP III risk factors include female age less than 63 years old, no history of early menopause without estrogen hormone replacement, non-diabetic, non-tobacco-user status, non-hypertensive, no ASHD (atherosclerotic heart disease), no prior stroke/TIA, no peripheral vascular disease, and no history of aortic aneurysm.     Current Medications (verified): 1)  Omeprazole 20 Mg Tbec (Omeprazole) .... Take 1 Tab Two Times A Day 30 Min Pre B'fast & Eve Meal X 8 Weeks 2)  Celexa 40 Mg  Tabs (Citalopram Hydrobromide) .[name].. 1 By Mouth Qd 3)  Xanax 0.25 Mg  Tabs (Alprazolam) .... 1/2 By Mouth Bid 4)  Multivitamin 5)  Calcium 6)  Fish Oil 7)  Ditropan Xl 5 Mg Xr24h-Tab (Oxybutynin Chloride) .[name].. 1 By Mouth Once Daily 8)  Pravastatin Sodium 20 Mg Tabs (Pravastatin Sodium) .[name].. 1 At Bedtime  Allergies (verified): No Known  Drug Allergies  Past History:  Past Medical History: Thrombocytopenia (platelet  count  133,000 in 2002) Anemia (HCT 34.7 in 2004) GERD (minimal benefit previously  with Pepcid,Zantac,Prevacid,Prilosec; Prevacid caused diarrhea) Diverticulosis, colon Skin cancer, PMH  of, Basal Cell, X3; Melanoma X1, Dr Campbell Stall Hyperlipidemia: LDL 117(1462/ 814),TG 118,HDL 42; LDL goal = < 110.[place] Study LDL goal = < 160.  Past Surgical History: Melanoma LUE 2006, [facility] Cholecystectomy 2002 Colonoscopy : Diverticulosis  2007 ;Hysterectomy for abnormal PAP 1991 (no BSO), Dr Annamaria Helling Tonsillectomy Endoscopy 2000: negative, Dr Yancey Flemings  Family History: Father:GERD,skin cancer:, HTN,lipids Mother: DM, HTN, lipids Siblings: sister lipids; MGM: MI < 10 ,DM ; MGF: MI < 55; PGF: ERD,colon cancer; P aunt: ovarian cancer  Social History: Occupation: Engineer, maintenance (IT) ,Tax Paramedic Married Never Smoked Alcohol use-yes: very  rarely Regular exercise-yes: walking & Elliptical 2-3 X/week X 30 min  Review of Systems  The patient denies anorexia, fever, vision loss, decreased hearing, hoarseness, syncope, dyspnea on exertion, prolonged cough, headaches, hematuria, suspicious skin lesions, depression, unusual weight change, abnormal bleeding, enlarged lymph nodes, and angioedema.         Some afternoon edema of legs.  Physical Exam  General:  well-nourished; alert,appropriate and cooperative throughout examination Head:  Normocephalic and atraumatic without obvious abnormalities. Hair very fine. Eyes:  No corneal or conjunctival  inflammation noted. Perrla. Funduscopic exam benign, without hemorrhages, exudates or papilledema.  Ears:  External ear exam shows no significant lesions or deformities.  Otoscopic examination reveals clear canals, tympanic membranes are intact bilaterally without bulging, retraction, inflammation or discharge. Hearing is grossly normal  bilaterally. Nose:  External nasal examination shows no deformity or inflammation. Nasal mucosa are pink and moist without lesions or exudates. Mouth:  Oral mucosa and oropharynx without lesions or exudates.  Teeth in  excellent  repair. Neck:  No deformities, masses, or tenderness noted. Lungs:  Normal respiratory effort, chest expands symmetrically. Lungs are clear to auscultation, no crackles or wheezes. Heart:  Normal rate and regular rhythm. S1 and S2 normal without gallop, murmur, click, rub .S4 Abdomen:  Bowel sounds positive,abdomen soft and non-tender without masses, organomegaly or hernias noted. Genitalia:  Dr Aldona Bar Msk:  No deformity or scoliosis noted of thoracic or lumbar spine.   Pulses:  R and L carotid,radial,dorsalis pedis and posterior tibial pulses are full and equal bilaterally Extremities:  No clubbing, cyanosis, edema, or deformity noted with normal full range of motion of all joints.   Neurologic:  alert & oriented X3 and DTRs symmetrical and normal.   Skin:  Intact without suspicious lesions or rashes. Scar  L wrist  Cervical Nodes:  No lymphadenopathy noted Axillary Nodes:  No palpable lymphadenopathy Psych:  memory intact for recent and remote, normally interactive, and good eye contact.     Impression & Recommendations:  Problem # 1:  ROUTINE GENERAL MEDICAL EXAM@HEALTH  CARE FACL (ICD-V70.0)  Orders: EKG w/ Interpretation (93000) Venipuncture ([address]) Specimen Handling (60454)  Problem # 2:  GERD (ICD-530.81)  The following medications were removed from the medication list:    Omeprazole 20 Mg Tbec (Omeprazole) .[name]... Take 1 tab two times a day 30 min pre b'fast & eve meal x 8 weeks Her updated medication list for this problem includes:    Nexium 40 Mg Cpdr (Esomeprazole magnesium) .[name]... 1 once daily  Problem # 3:  HYPERLIPIDEMIA (ICD-272.2)  Her updated medication list for this problem includes:    Pravastatin Sodium 20 Mg Tabs (Pravastatin sodium) .[name]...  1 at bedtime  Complete Medication List: 1)  Celexa 40 Mg Tabs (Citalopram hydrobromide) .[name].. 1 by mouth qd 2)  Xanax 0.25 Mg Tabs (Alprazolam) .... 1/2 by mouth bid 3)  Multivitamin  4)  Calcium  5)  Fish Oil  6)  Ditropan Xl 5 Mg Xr24h-tab (Oxybutynin chloride) .[name].. 1 by mouth once daily 7)  Pravastatin Sodium 20 Mg Tabs (Pravastatin sodium) .[name].. 1 at bedtime 8)  Nexium 40 Mg Cpdr (Esomeprazole magnesium) .[name].. 1 once daily  Other Orders: Tdap => 20yrs IM (09811) Admin 1st Vaccine (91478)  Lipid Assessment/Plan:      Based on NCEP/ATP III, the patient's risk factor category is "2 or more risk factors and a calculated 10 year CAD risk of < 20%".  The patient's lipid goals are as follows: Total cholesterol goal is 200; LDL cholesterol goal is 110; HDL cholesterol goal is 50; Triglyceride goal is 150.  Her LDL cholesterol goal has not been met.  Secondary causes for hyperlipidemia have been ruled out.  She has been counseled on adjunctive measures for lowering her cholesterol and has been provided with dietary instructions.    Patient Instructions: 1)  Avoid foods high in acid (tomatoes, citrus juices, spicy foods). Avoid eating within two hours of lying down or before exercising. Do not over eat; try smaller more frequent meals. Elevate head  of bed twelve inches when sleeping. Prescriptions: NEXIUM 40 MG CPDR (ESOMEPRAZOLE MAGNESIUM) 1 once daily  #30 x 11   Entered and Authorized by:   Marga Melnick MD   Signed by:   Marga Melnick MD on 10/23/2009   Method used:   Print then Give to Patient   RxID:   [id number]    Immunizations Administered:  Tetanus Vaccine:    Vaccine Type: Tdap    Site: right deltoid    Mfr: Gl[employer]oSmithK[employer]    Dose: 0.5 ml    Route: IM    Given by: Shonna Chock CMA    Exp. Date: 11/23/2011    Lot #: [id number]    VIS given: 12/22/07 version given October 23, 2009.

## 2010-03-05 NOTE — Progress Notes (Signed)
Summary: increase med  Phone Note Call from Patient Call back at Work Phone [phone] Call back at ext [phone]   Caller: Patient Summary of Call: pt left VM that she is currently taking OMEPRAZOLE 20 MG TBEC Take 1 tab once daily for acid reflux.  pt states at times it does not seem to be controlling the reflux and pt would like to know if it would be possible to increase med. pls advise............Marland KitchenFelecia Deloach CMA  October 02, 2009 2:54 PM   Follow-up for Phone Call        increase to two times a day 30 min pre b'fast &  eve meal X 8 weeks ; OVINB after this dose increase Follow-up by: Marga Melnick MD,  October 03, 2009 5:21 AM  Additional Follow-up for Phone Call Additional follow up Details #1::        pt aware will increase med and discuss any further issue with med at upcoming OV..............Marland KitchenFelecia Deloach CMA  October 03, 2009 9:44 AM     New/Updated Medications: OMEPRAZOLE 20 MG TBEC (OMEPRAZOLE) Take 1 tab two times a day 30 min pre b'fast & eve meal X 8 weeks Prescriptions: OMEPRAZOLE 20 MG TBEC (OMEPRAZOLE) Take 1 tab two times a day 30 min pre b'fast & eve meal X 8 weeks  #60 x 1   Entered by:   Jeremy Johann CMA   Authorized by:   Marga Melnick MD   Signed by:   Jeremy Johann CMA on 10/03/2009   Method used:   Faxed to ...       [facility] [phone]* (retail)       [address] [place], [place]  [address]       Ph: [phone] or [phone]       Fax: [phone]   RxID:   [id number]

## 2010-03-05 NOTE — Progress Notes (Signed)
Summary: rx  Phone Note Call from Patient   Caller: Patient Summary of Call: pt called left msg pharmacy did not receive rx for gen prilosec. [facility] and spoke with pharmacist they did not receive, (RX was sent 06/20/09) given verbal. left msg for pt, rx wil be ready today .Kandice Hams  Jun 21, 2009 12:11 PM   Initial call taken by: Kandice Hams,  Jun 21, 2009 12:11 PM

## 2010-03-05 NOTE — Progress Notes (Signed)
Summary: rx for diflucan  Phone Note Call from Patient Call back at Work Phone [phone] Call back at [phone]   Caller: Patient Summary of Call: patient would like to get prescription for diflucan antibiotic she is currently causing yeast infection. Initial call taken by: Doristine Devoid,  April 06, 2009 11:16 AM    New/Updated Medications: FLUCONAZOLE 150 MG TABS (FLUCONAZOLE) take one tablet may repeat in 72 hours if needed Prescriptions: FLUCONAZOLE 150 MG TABS (FLUCONAZOLE) take one tablet may repeat in 72 hours if needed  #2 x 0   Entered by:   Doristine Devoid   Authorized by:   Marga Melnick MD   Signed by:   Doristine Devoid on 04/06/2009   Method used:   Electronically to        [facility] [phone]* (retail)       [address] [place], [place]  [address]       Ph: [phone] or [phone]       Fax: [phone]   RxID:   [id number]

## 2010-03-29 ENCOUNTER — Encounter (HOSPITAL_BASED_OUTPATIENT_CLINIC_OR_DEPARTMENT_OTHER): Payer: 59 | Admitting: Oncology

## 2010-03-29 ENCOUNTER — Other Ambulatory Visit: Payer: Self-pay | Admitting: Oncology

## 2010-03-29 DIAGNOSIS — C4360 Malignant melanoma of unspecified upper limb, including shoulder: Secondary | ICD-10-CM

## 2010-03-29 LAB — COMPREHENSIVE METABOLIC PANEL
Alkaline Phosphatase: 30 U/L — ABNORMAL LOW (ref 39–117)
BUN: 10 mg/dL (ref 6–23)
Calcium: 8.6 mg/dL (ref 8.4–10.5)
Chloride: 106 mEq/L (ref 96–112)
Potassium: 3.5 mEq/L (ref 3.5–5.3)
Sodium: 138 mEq/L (ref 135–145)

## 2010-03-29 LAB — CBC WITH DIFFERENTIAL/PLATELET
Basophils Absolute: 0.1 10*3/uL (ref 0.0–0.1)
EOS%: 2.4 % (ref 0.0–7.0)
HCT: 41.3 % (ref 34.8–46.6)
LYMPH%: 21 % (ref 14.0–49.7)
MCH: 30.5 pg (ref 25.1–34.0)
MCV: 88.4 fL (ref 79.5–101.0)
MONO%: 5.6 % (ref 0.0–14.0)
NEUT#: 5.4 10*3/uL (ref 1.5–6.5)
NEUT%: 70 % (ref 38.4–76.8)
Platelets: 221 10*3/uL (ref 145–400)
WBC: 7.8 10*3/uL (ref 3.9–10.3)
lymph#: 1.6 10*3/uL (ref 0.9–3.3)

## 2010-03-29 LAB — LACTATE DEHYDROGENASE: LDH: 98 U/L (ref 94–250)

## 2010-06-21 NOTE — Op Note (Signed)
NAMEMARGO, LAMA               ACCOUNT NO.:  [id number]   MEDICAL RECORD NO.:  [id number]          PATIENT TYPE:  AMB   LOCATION:  DSC                          FACILITY:  [facility]   PHYSICIAN:  Gabrielle Dare. Janee Morn, M.D.DATE OF BIRTH:  Aug 16, 1960   DATE OF PROCEDURE:  12/18/2004  DATE OF DISCHARGE:                                 OPERATIVE REPORT   PREOPERATIVE DIAGNOSIS:  1.  Basal cell carcinoma, right calf.  2.  Melanoma, left forearm.   POSTOPERATIVE DIAGNOSES:  1.  Basal cell carcinoma, right calf.  2.  Melanoma, left forearm.   PROCEDURE:  1.  Excision basal cell carcinoma, right calf.  2.  Left axillary sentinel lymph node biopsy.  3.  Wide excision melanoma, left forearm.   SURGEON:  Gabrielle Dare. Janee Morn, M.D.   ANESTHESIA:  General.   HISTORY OF PRESENT ILLNESS:  The patient is a 50 year old female who I  evaluated in the office for a basal cell carcinoma diagnosed on biopsy, from  her right lateral calf; and also for a melanoma on her left forearm, which  is at least 0.46 mm. She presents for excision of her basal cell carcinoma  of her right calf and wide excision of the melanoma in her left forearm with  left axillary sentinel lymph node biopsy.   PROCEDURE IN DETAIL:  Informed consent was obtained the patient's lesions  were marked. She received intravenous antibiotics. She was brought to the  operating room and general anesthesia was administered. Attention was first  directed to her right calf. The right lower extremity was prepped and draped  in sterile fashion. An elliptical incision was made encompassing the scar  from her basal cell carcinoma biopsy with clear margins around the scar.  Prior to making the incision 1/2% Marcaine with epinephrine was injected for  local anesthetic subcutaneous tissues were dissected down and the elliptical  lesion was removed including some subcutaneous tissue.  Specimen was marked  for pathology.   The wound was copiously  irrigated. Hemostasis was obtained.  The wound was  then closed in two layers with subcutaneous tissues approximated with 3-0  Vicryl suture in the skin; closed with a running 4-0 Monocryl subcuticular  stitch. Benzoin, Steri-Strips, and sterile dressing were applied.  Sponge,  needle, and instrument counts were correct at this time.   I then removed my gown and gloves.  We changed to a completely new  instrument set and table; and 4 mL of methylene blue was injected  subcutaneously around the melanoma in the left forearm.  This was then  massaged for 4 minutes by the clock.  Then her left arm and axilla and chest  were prepped and draped in a sterile fashion.  We used new instruments for  this procedure. The NeoProbe was then used to locate a hot area in her  axilla. This was towards the anterior portion of her axilla, 1/2% percent  Marcaine was injected along the area marked from the NeoProbe reading; and a  transverse incision was made. Subcutaneous tissues were dissected down into  the axillary fat; and under  the guidance of the NeoProbe, a blue node was  identified that had a high signal of about 400. This was circumferentially  dissected; and carried down to avoid the musculature and nerves in the  axilla.  The node was removed. Hemostasis was assured with the Bovie  cautery.  The node was rechecked and noted to have a good signal with the  NeoProbe up to about 400; and it was clearly dark blue.  That was sent to  pathology as a hot blue node. The axilla was then probed with the NeoProbe,  again, and no signals above 4 could be obtained. No other blue nodes were  visualized. The wound was copiously irrigated and hemostasis was assured;  and the wound was then closed in two layers. The subcutaneous tissues were  approximated with a running 3-0 Vicryl suture; and the skin was closed with  a running 4-0 Monocryl subcuticular stitch. Benzoin, Steri-Strips, and  sterile dressings were  applied.   Attention was then directed to the melanoma on the radial aspect of the left  distal forearm.  An elliptical border was traced around the melanoma giving  greater than 1 cm margins medial and laterally and 2.5 or so centimeters  proximally and distally in order to facilitate closure. The area was then  infiltrated with 1/2% Marcaine with an epinephrine.  An elliptical incision  was made, per the measured guidelines, and subcutaneous tissues were  dissected down towards the fascia. We stayed above the musculature of the  forearm.  The melanoma was widely excised along with its subcutaneous  tissues. The excision was about 4 x 6 cm.  The specimen was marked for  orientation and sent to pathology.  Subcutaneous tissues were irrigated.  Hemostasis was obtained with Bovie cautery. Skin flaps were raised medially  and laterally to facilitate closure. Subcutaneous tissues was then  reapproximated with a series of interrupted 3-0 Vicryl sutures. Skin was  closed with interrupted 4-0 nylon sutures, providing a nice closure without  excessive tension; hand retained good passive range motion, and excellent  capillary refill.  A Xeroform sterile dressing and a Kerlix were applied.  Sponge, needle and instrument counts were all correct. A sterile dressing  was also applied in the axillary wound. The patient was taken to recovery  room in stable condition after tolerating procedure without apparent  complications.      Gabrielle Dare Janee Morn, M.D.  Electronically Signed     BET/MEDQ  D:  12/18/2004  T:  12/18/2004  Job:  [id number]   cc:   Hope M. Danella Deis, M.D.  Fax: [phone]

## 2010-06-26 ENCOUNTER — Emergency Department (HOSPITAL_COMMUNITY): Payer: 59

## 2010-06-26 ENCOUNTER — Observation Stay (HOSPITAL_COMMUNITY)
Admission: EM | Admit: 2010-06-26 | Discharge: 2010-06-28 | Disposition: A | Discharge status: Home / Self Care | Payer: 59 | Attending: Internal Medicine | Admitting: Internal Medicine

## 2010-06-26 DIAGNOSIS — K5289 Other specified noninfective gastroenteritis and colitis: Secondary | ICD-10-CM | POA: Insufficient documentation

## 2010-06-26 DIAGNOSIS — F411 Generalized anxiety disorder: Secondary | ICD-10-CM | POA: Insufficient documentation

## 2010-06-26 DIAGNOSIS — Z79899 Other long term (current) drug therapy: Secondary | ICD-10-CM | POA: Insufficient documentation

## 2010-06-26 DIAGNOSIS — R0789 Other chest pain: Principal | ICD-10-CM | POA: Insufficient documentation

## 2010-06-26 DIAGNOSIS — E785 Hyperlipidemia, unspecified: Secondary | ICD-10-CM | POA: Insufficient documentation

## 2010-06-26 DIAGNOSIS — K219 Gastro-esophageal reflux disease without esophagitis: Secondary | ICD-10-CM | POA: Insufficient documentation

## 2010-06-26 DIAGNOSIS — Z85820 Personal history of malignant melanoma of skin: Secondary | ICD-10-CM | POA: Insufficient documentation

## 2010-06-26 DIAGNOSIS — F329 Major depressive disorder, single episode, unspecified: Secondary | ICD-10-CM | POA: Insufficient documentation

## 2010-06-26 DIAGNOSIS — F3289 Other specified depressive episodes: Secondary | ICD-10-CM | POA: Insufficient documentation

## 2010-06-26 LAB — CBC
HCT: 41.2 % (ref 36.0–46.0)
Hemoglobin: 14.5 g/dL (ref 12.0–15.0)
MCHC: 35.2 g/dL (ref 30.0–36.0)
MCV: 85.7 fL (ref 78.0–100.0)
Platelets: 156 10*3/uL (ref 150–400)
RDW: 13.1 % (ref 11.5–15.5)

## 2010-06-26 LAB — DIFFERENTIAL
Basophils Absolute: 0 10*3/uL (ref 0.0–0.1)
Basophils Relative: 0 % (ref 0–1)
Eosinophils Absolute: 0.1 10*3/uL (ref 0.0–0.7)
Lymphocytes Relative: 9 % — ABNORMAL LOW (ref 12–46)
Lymphs Abs: 0.9 10*3/uL (ref 0.7–4.0)
Monocytes Relative: 6 % (ref 3–12)
Neutrophils Relative %: 84 % — ABNORMAL HIGH (ref 43–77)

## 2010-06-26 LAB — URINALYSIS, ROUTINE W REFLEX MICROSCOPIC
Nitrite: NEGATIVE
Protein, ur: NEGATIVE mg/dL
Urobilinogen, UA: 1 mg/dL (ref 0.0–1.0)
pH: 7.5 (ref 5.0–8.0)

## 2010-06-26 LAB — POCT CARDIAC MARKERS
Myoglobin, poc: 31 ng/mL (ref 12–200)
Troponin i, poc: <0.05 ng/mL (ref 0.00–0.09)

## 2010-06-26 LAB — COMPREHENSIVE METABOLIC PANEL
AST: 27 U/L (ref 0–37)
Albumin: 3.4 g/dL — ABNORMAL LOW (ref 3.5–5.2)
BUN: 7 mg/dL (ref 6–23)
CO2: 25 mEq/L (ref 19–32)
Creatinine, Ser: <0.47 mg/dL (ref 0.4–1.2)
Glucose, Bld: 99 mg/dL (ref 70–99)
Sodium: 132 mEq/L — ABNORMAL LOW (ref 135–145)
Total Bilirubin: 0.4 mg/dL (ref 0.3–1.2)
Total Protein: 6.4 g/dL (ref 6.0–8.3)

## 2010-06-26 LAB — POCT PREGNANCY, URINE: Preg Test, Ur: NEGATIVE

## 2010-06-26 LAB — PROTIME-INR: INR: 1 (ref 0.00–1.49)

## 2010-06-27 ENCOUNTER — Observation Stay (HOSPITAL_COMMUNITY): Payer: 59

## 2010-06-27 DIAGNOSIS — K529 Noninfective gastroenteritis and colitis, unspecified: Secondary | ICD-10-CM

## 2010-06-27 DIAGNOSIS — R1013 Epigastric pain: Secondary | ICD-10-CM

## 2010-06-27 DIAGNOSIS — R079 Chest pain, unspecified: Secondary | ICD-10-CM

## 2010-06-27 LAB — COMPREHENSIVE METABOLIC PANEL WITH GFR
ALT: 42 U/L — ABNORMAL HIGH (ref 0–35)
AST: 43 U/L — ABNORMAL HIGH (ref 0–37)
Albumin: 2.8 g/dL — ABNORMAL LOW (ref 3.5–5.2)
Alkaline Phosphatase: 45 U/L (ref 39–117)
BUN: 5 mg/dL — ABNORMAL LOW (ref 6–23)
CO2: 26 meq/L (ref 19–32)
Calcium: 8.2 mg/dL — ABNORMAL LOW (ref 8.4–10.5)
Chloride: 104 meq/L (ref 96–112)
Creatinine, Ser: <0.47 mg/dL (ref 0.4–1.2)
Glucose, Bld: 105 mg/dL — ABNORMAL HIGH (ref 70–99)
Potassium: 3.5 meq/L (ref 3.5–5.1)
Sodium: 138 meq/L (ref 135–145)
Total Bilirubin: 0.2 mg/dL — ABNORMAL LOW (ref 0.3–1.2)
Total Protein: 5.6 g/dL — ABNORMAL LOW (ref 6.0–8.3)

## 2010-06-27 LAB — URINE CULTURE
Colony Count: 2000
Culture  Setup Time: 201205232019

## 2010-06-27 LAB — GLUCOSE, CAPILLARY: Glucose-Capillary: 92 mg/dL (ref 70–99)

## 2010-06-27 LAB — LIPID PANEL
Cholesterol: 130 mg/dL (ref 0–200)
HDL: 37 mg/dL — ABNORMAL LOW
Total CHOL/HDL Ratio: 3.5 ratio
Triglycerides: 102 mg/dL
VLDL: 20 mg/dL (ref 0–40)

## 2010-06-27 LAB — CBC
HCT: 37.4 % (ref 36.0–46.0)
Hemoglobin: 12.8 g/dL (ref 12.0–15.0)
MCH: 29.7 pg (ref 26.0–34.0)
MCHC: 34.2 g/dL (ref 30.0–36.0)
MCV: 86.8 fL (ref 78.0–100.0)
Platelets: 138 K/uL — ABNORMAL LOW (ref 150–400)
RBC: 4.31 MIL/uL (ref 3.87–5.11)
RDW: 13.4 % (ref 11.5–15.5)
WBC: 5.6 K/uL (ref 4.0–10.5)

## 2010-06-27 LAB — CARDIAC PANEL(CRET KIN+CKTOT+MB+TROPI)
CK, MB: 0.9 ng/mL (ref 0.3–4.0)
CK, MB: 0.9 ng/mL (ref 0.3–4.0)
Relative Index: INVALID (ref 0.0–2.5)
Total CK: 26 U/L (ref 7–177)

## 2010-06-27 LAB — CK TOTAL AND CKMB (NOT AT ARMC)
CK, MB: 1 ng/mL (ref 0.3–4.0)
Relative Index: INVALID (ref 0.0–2.5)
Total CK: 29 U/L (ref 7–177)

## 2010-06-27 LAB — D-DIMER, QUANTITATIVE: D-Dimer, Quant: 0.58 ug/mL-FEU — ABNORMAL HIGH (ref 0.00–0.48)

## 2010-06-27 LAB — TSH: TSH: 1.674 u[IU]/mL (ref 0.350–4.500)

## 2010-06-27 LAB — TROPONIN I

## 2010-06-27 LAB — BILIRUBIN, DIRECT: Bilirubin, Direct: 0.1 mg/dL (ref 0.0–0.3)

## 2010-06-27 LAB — MAGNESIUM: Magnesium: 1.8 mg/dL (ref 1.5–2.5)

## 2010-06-27 MED ORDER — IOHEXOL 300 MG/ML  SOLN
75.0000 mL | Freq: Once | INTRAMUSCULAR | Status: AC | PRN
  Administered 2010-06-27: 75 mL via INTRAVENOUS

## 2010-06-27 NOTE — H&P (Signed)
NAME:  Kristin Rasmussen, Kristin Rasmussen NO.:  [id number]  MEDICAL RECORD NO.:  [id number]           PATIENT TYPE:  E  LOCATION:  [facility]                         FACILITY:  [facility]  PHYSICIAN:  Eduard Clos, MDDATE OF BIRTH:  1960/12/05  DATE OF ADMISSION:  06/26/2010 DATE OF DISCHARGE:                             HISTORY & PHYSICAL   PRIMARY CARE PHYSICIAN:  Titus Dubin. Alwyn Ren, MD, FACP, FCCP  CHIEF COMPLAINT:  Chest pain.  HISTORY OF PRESENTING ILLNESS:  A 50 year old female with history of hyperlipidemia, GERD, anxiety, depression and history of melanoma, has been experiencing some nausea and diarrhea and she had at least 8 episodes of diarrhea over the day.  By the time of evening, she sudden developing some chest pressure.  The chest pressure was retrosternal, nonradiating, has no diaphoresis.  There is no shortness of breath.  The patient was brought to the ER.  In the ER, by the time she got nitroglycerin.  Her symptoms have improved.  At this time, EKG does not show anything acute.  Cardiac enzymes have been negative.  The patient has been admitted for further workup.  The patient at this time is still mildly nauseous.  Denies any abdominal pain.  Denies any dysuria, discharge, has not had any diarrhea after the patient came to the ER.  Denies any dizziness or loss of consciousness. Denies any focal deficit.  PAST MEDICAL HISTORY: 1. History of hyperlipidemia. 2. GERD. 3. Anxiety and depression. 4. History of melanoma.  PAST SURGICAL HISTORY:  Cholecystectomy.  MEDICATIONS PRIOR TO ADMISSION:  Xanax, Nexium, pravastatin and she also takes medicines for depression.  We have to verify all the medications and doses.  ALLERGIES:  No known drug allergies.  FAMILY HISTORY:  Significant for coronary artery disease in a mom.  SOCIAL HISTORY:  The patient denies smoking cigarette, drinking alcohol, or use any illegal drugs.  Works for a tax office.  REVIEW OF  SYSTEMS:  As per the history of presenting illness, nothing else significant.  PHYSICAL EXAMINATION:  GENERAL:  The patient is examined at the bedside, not in acute distress. VITAL SIGNS:  Blood pressure is 94/60, pulse 80 per minute, temperature 98.2, respirations 18 per minute, O2 sat 98%. HEENT:  Anicteric.  No pallor.  No discharge from ears, eyes, nose, or mouth. CHEST:  Bilateral air entry present.  No rhonchi.  No crepitation. HEART:  S1 and S2 heard. ABDOMEN:  Soft, nontender.  Bowel sounds heard. CNS:  She is alert, awake, and oriented to time, place, and person. Moves upper and lower extremities, 5/5. EXTREMITIES:  Peripheral pulses felt.  No edema.  LABORATORY STUDIES:  EKG shows normal sinus rhythm with nonspecific ST-T changes.  Heart rate is around 82 beats per minute.  CBC; WBC 9.8, hemoglobin is 14.5, hematocrit is 41.2, platelets 156, neutrophils 84%. PT and INR are 13.4 and 1.  Complete metabolic panel; sodium 132, potassium 3.6, chloride 97, carbon dioxide 25, glucose 99, BUN 7, creatinine less than 0.4.  Total bilirubin is 0.4, alk phos 46, AST 27, ALT 23, total protein 6.4, albumin 3.4, calcium 9.1.  CK-MB is  less than 1, troponin less than 0.05, myoglobin 31.  Pregnancy screen is negative. UA is negative for nitrites and leukocyte, positive for ketones more than 80.  The patient's anion gap is 10.  Chest x-ray was negative.  ASSESSMENT: 1. Chest pain to rule out acute coronary syndrome. 2. Nausea, vomiting, and diarrhea probably viral gastroenteritis. 3. History of hyperlipidemia. 4. History of gastroesophageal reflux disease. 5. History of anxiety and depression. 6. History of melanoma.  PLAN: 1. At this time, we will admit the patient to telemetry. 2. For her chest pain, at this time, the patient is chest pain free.     We will continue the patient on p.r.n. nitroglycerin and aspirin.     The patient will also be on Protonix.  We will cycle her cardiac      markers.  Get 2-D echo.  At this time, I am also want to get a D-     dimer.  If D-dimer is positive, we need to go to CT angio of chest. 3. For nausea, vomiting and diarrhea, at this time, we will give the     patient a clear liquid diet.  Slow advance as tolerated.  We will     check a lipase level.  We will do stool studies.  If the patient     develops any abdominal pain, nausea, vomiting and diarrhea recurs     and persist, the we need to scan the abdomen. 4. Further recommendations as condition evolves.     Eduard Clos, MD     ANK/MEDQ  D:  06/26/2010  T:  06/26/2010  Job:  [id number]  cc:   Titus Dubin. Alwyn Ren, MD,FACP,FCCP  Electronically Signed by Midge Minium MD on 06/27/2010 06:31:37 AM

## 2010-06-28 LAB — CBC
HCT: 36.9 % (ref 36.0–46.0)
Hemoglobin: 12.5 g/dL (ref 12.0–15.0)
MCV: 88.1 fL (ref 78.0–100.0)
RBC: 4.19 MIL/uL (ref 3.87–5.11)
WBC: 5.1 10*3/uL (ref 4.0–10.5)

## 2010-06-28 LAB — BASIC METABOLIC PANEL
BUN: 4 mg/dL — ABNORMAL LOW (ref 6–23)
CO2: 27 mEq/L (ref 19–32)
Calcium: 8.5 mg/dL (ref 8.4–10.5)
Glucose, Bld: 87 mg/dL (ref 70–99)

## 2010-06-28 LAB — HEPATIC FUNCTION PANEL
ALT: 32 U/L (ref 0–35)
AST: 23 U/L (ref 0–37)
Albumin: 2.7 g/dL — ABNORMAL LOW (ref 3.5–5.2)
Alkaline Phosphatase: 43 U/L (ref 39–117)
Bilirubin, Direct: <0.1 mg/dL (ref 0.0–0.3)
Total Bilirubin: 0.1 mg/dL — ABNORMAL LOW (ref 0.3–1.2)

## 2010-07-03 ENCOUNTER — Telehealth: Payer: Self-pay | Admitting: Internal Medicine

## 2010-07-03 NOTE — Telephone Encounter (Signed)
Pt states that she was in the hospital last week and was told she had esophageal spasms and gerd. She states she was told to follow-up with Dr. Marina Goodell for possible EGD. Pt had an egd with Dr. Marina Goodell in 2002 and a colon with Dr. Marina Goodell in 2007. Pt scheduled to see Dr. Marina Goodell 07/05/10@9 :30am. Pt aware of appt date and time.

## 2010-07-05 ENCOUNTER — Ambulatory Visit (INDEPENDENT_AMBULATORY_CARE_PROVIDER_SITE_OTHER): Payer: 59 | Admitting: Internal Medicine

## 2010-07-05 ENCOUNTER — Encounter: Payer: Self-pay | Admitting: Internal Medicine

## 2010-07-05 VITALS — BP 126/72 | HR 64 | Ht 63.0 in | Wt 183.0 lb

## 2010-07-05 DIAGNOSIS — K219 Gastro-esophageal reflux disease without esophagitis: Secondary | ICD-10-CM

## 2010-07-05 DIAGNOSIS — R0789 Other chest pain: Secondary | ICD-10-CM

## 2010-07-05 DIAGNOSIS — K589 Irritable bowel syndrome without diarrhea: Secondary | ICD-10-CM

## 2010-07-05 MED ORDER — ALIGN 4 MG PO CAPS: 1.0000 | ORAL_CAPSULE | Freq: Every day | ORAL | Status: DC

## 2010-07-05 MED ORDER — ESOMEPRAZOLE MAGNESIUM 40 MG PO CPDR: 40.0000 mg | DELAYED_RELEASE_CAPSULE | Freq: Every day | ORAL | Status: DC

## 2010-07-05 NOTE — Patient Instructions (Signed)
Samples of Nexium given for you to take 1 daily. A discount card for Nexium given for you to take to your pharmacy when you get Nexium prescription filled. Samples of Align given take 1 daily x 2 weeks. IBS brochure given for you to review. Follow-up prn.

## 2010-07-05 NOTE — Progress Notes (Signed)
HISTORY OF PRESENT ILLNESS:  Kristin Rasmussen is a 50 y.o. female with the below medical history who is referred to this office regarding atypical chest pain. She has been seen previously in this office for GERD, IBS, and screening colonoscopy. She is accompanied by her husband. Patient was in her usual state of health until 06/26/2010 when she developed chest pain. She described this as a substernal pressure with some radiation to the left. She went to the emergency room and was subsequently admitted to the hospital. The pain lasted significantly for 24-36 hours. She ruled out for cardiac disease. The pain had a pleuritic component. For this, a CT scan of the chest was performed. No evidence of pulmonary embolus. Laboratories were unremarkable including liver function tests. She is status post cholecystectomy and states that the pain was not similar to her gallbladder pain. She stays on chronic PPI which controlled her GERD well. She denies that the pain reminds her GERD. She was told that she had "esophageal spasm" and was advised to see her GI doctor as well as her PCP for follow up. She continues with IBS-type complaints and inquired about probiotic use. Her bowels tend to alternate between constipation and diarrhea. GI review of systems is otherwise negative. No dysphagia.  REVIEW OF SYSTEMS:  All non-GI ROS negative except for sinus and allergy trouble, anxiety, back pain, headaches, hearing problems, night sweats, excessive thirst.  Past Medical History  Diagnosis Date  . Thrombocytopenia   . Anemia   . GERD (gastroesophageal reflux disease)   . Diverticulosis   . Basal cell cancer     x3  . Melanoma     x1  . Hyperlipidemia   . IBS (irritable bowel syndrome)   . Family hx of colon cancer grandfather  . Spasm of esophagus     Past Surgical History  Procedure Date  . Melanoma excision     LUE 2006  [facility]  . Cholecystectomy   . Tonsillectomy     Social History Kristin Rasmussen   reports that she has never smoked. She does not have any smokeless tobacco history on file. She reports that she drinks alcohol. She reports that she does not use illicit drugs.  family history includes Colon cancer in an unspecified family member; Colon polyps in her father; Diabetes in her mother; Hypertension in an unspecified family member; Ovarian cancer in her paternal aunt; and Skin cancer in her father.  No Known Allergies     PHYSICAL EXAMINATION: Vital signs: BP 126/72  Pulse 64  Ht 5\' 3"  (1.6 m)  Wt 183 lb (83.008 kg)  BMI 32.42 kg/m2  Constitutional: generally well-appearing, no acute distress Psychiatric: alert and oriented x3, cooperative Eyes: extraocular movements intact, anicteric, conjunctiva pink Mouth: oral pharynx moist, no lesions Neck: supple no lymphadenopathy Chest: Reproducible pain with palpation of lower sternum Cardiovascular: heart regular rate and rhythm, no murmur Lungs: clear to auscultation bilaterally Abdomen: soft, nontender, nondistended, no obvious ascites, no peritoneal signs, normal bowel sounds, no organomegaly Extremities: no lower extremity edema bilaterally Skin: no lesions on visible extremities Neuro: No focal deficits.   ASSESSMENT:  #1. Chest pain. History and physical exam most consistent with musculoskeletal, such as costochondritis. This pain is non-GI in origin. Other evaluation for this pain as outlined in the hospital discharge summary #2. GERD. Negative prior upper endoscopy. The symptoms on PPI #3. IBS. Alternating. Ongoing. #4. Family history of colon cancer in second-degree relative as well as colon polyps in first-degree  relative. Index colonoscopy May 2007 was unremarkable except for mild diverticulosis   PLAN:  #1. Over-the-counter NSAIDs as needed for chest. The pain continues or worsens after 4 weeks, return to Dr. Alwyn Ren for further treatment #2. Continue PPI #3. Reflux precautions with attention to weight  loss #4. Increased fiber supplementation #5. Probiotic Align one daily for 2 weeks. Samples given as a trial. May use on demand it is helpful. #6. Repeat screening colonoscopy within the next 5 years.

## 2010-07-09 ENCOUNTER — Encounter: Payer: Self-pay | Admitting: Internal Medicine

## 2010-07-12 ENCOUNTER — Telehealth: Payer: Self-pay

## 2010-07-12 NOTE — Telephone Encounter (Signed)
Pt left msg on triage voicemail that she would like to have MRI done for back. Tried called work number pt didn't leave extension to get her since there where multiple options  Called Home # left msg for pt to return call.

## 2010-07-13 ENCOUNTER — Encounter: Payer: Self-pay | Admitting: Internal Medicine

## 2010-07-16 NOTE — Telephone Encounter (Signed)
appt scheduled for pt to see Dr. Alwyn Ren to address

## 2010-07-16 NOTE — Discharge Summary (Signed)
NAMECAROLIE, MCILRATH NO.:  [id number]  MEDICAL RECORD NO.:  [id number]           PATIENT TYPE:  O  LOCATION:  2035                         FACILITY:  [facility]  PHYSICIAN:  Marinda Elk, M.D.DATE OF BIRTH:  [birth year]  DATE OF ADMISSION:  06/26/2010 DATE OF DISCHARGE:  06/28/2010                              DISCHARGE SUMMARY   PRIMARY CARE PHYSICIAN:  Titus Dubin. Alwyn Ren, MD,FACP,FCCP  GASTROENTEROLOGIST:  Wilhemina Bonito. Marina Goodell, MD  DISCHARGE DIAGNOSES: 1. Atypical chest pain. 2. Gastroenteritis with nausea and diarrhea. 3. Gastroesophageal reflux disease. 4. Mild thrombocytopenia. 5. Hypoalbuminemia. 6. History of dyslipidemia. 7. History of anxiety and depression. 8. History of melanoma.  CONDITION AT THE TIME OF DISCHARGE:  The patient is alert and oriented. She appears chipper and is asking for discharge.  She has not had any chest pain for the last 36 hours.  She does still have some diarrhea but it is less than it was yesterday.  HISTORY AND BRIEF HOSPITAL COURSE:  Ms. Fernando is an extremely pleasant 50 year old female who presented to the emergency department on the 23rd after having experienced a 1-hour period of left-sided chest pain and pressure.  This radiated around her left breast and down her side.  It was relieved somewhat with sublingual nitroglycerin.  It was not associated with diaphoresis.  There was no shortness of breath while she was in the ED.  She was admitted to the hospital for cardiac workup, however, her chest pain returned that evening and she did have difficulty with shortness of breath at the time.  It was again relieved with some sublingual nitroglycerin and a GI cocktail.  After the evening of the 23rd, she had no further chest pain.  On exam the next morning, Ms. Lewan reported that her chest pain was worse with inspiration and that she had a history of varicosities.  Her D-dimer had been mildly elevated at 0.58.   Consequently a CT angiogram was ordered to rule out pulmonary embolus.  Fortunately her CT angiogram was found to be negative for pulmonary embolism.  Cardiac enzymes were drawn x3 and were all found to be within normal limits.  A 2-D echo of her chest was originally ordered but was not completed during this hospitalization. The patient will be scheduled for an out patient stress test.  Diarrhea, gastroenteritis.  Prior to admission, the patient had had approximately 10 watery bowel movements and had been very nauseated. She did not have any diarrhea overnight but did continue to have diarrhea the next day and still continues this morning to have an episode of diarrhea (5/25).  However, C. diff PCR was negative. The patient's diarrhea is starting to subside and she will be started on Imodium.  [facility] was consulted to see the patient for noncardiac chest pain and gastroenteritis.  It was felt that an inpatient EGD was not needed.  Her noncardiac chest pain had resolved.  Her nausea and diarrhea was felt likely to be viral gastroenteritis.  It was also felt that her heavy doses of proton pump inhibitors could be contributing to her diarrhea.  This morning at  the time of discharge, the patient is no longer having chest pain.  Her diarrhea has diminished significantly and she reports she is ready to go home.  PHYSICAL EXAMINATION AT THE TIME OF DISCHARGE:  GENERAL:  The patient is alert and oriented, sitting up in a chair.  She appears well. VITAL SIGNS:  Temperature 98.7, pulse 78, respirations 18, blood pressure 97/59.  O2 sats 96% on room air. HEENT:  Head atraumatic, normocephalic.  Eyes are anicteric.  Pupils are equal, round, reactive to light.  Her nose shows no nasal discharge or exterior lesions.  Mouth has moist membranes with good dentition. NECK:  Supple with midline trachea.  No JVD.  No lymphadenopathy. CHEST:  Demonstrates no accessory muscle use.  She has  no wheezes or crackles to my exam. HEART:  Has a regular rate and rhythm without obvious murmurs, rubs, or gallops. ABDOMEN:  Soft, nontender, nondistended with bowel sounds. EXTREMITIES:  No clubbing, cyanosis, or edema.  She is able to move all 4 extremities without difficulty. NEUROLOGICAL:  Cranial nerves II-XII are grossly intact.  She has no facial asymmetries.  No obvious focal neuro deficits. PSYCHIATRIC:  The patient is alert and oriented.  Demeanor is pleasant, cooperative.  PERTINENT LABS THIS HOSPITALIZATION:  Cardiac enzymes x3 were drawn and all found to be negative.  C. diff PCR was negative.  Urine culture was obtained.  She had 2000 colonies of insignificant growth.  Culture was negative.  Cholesterol panel was done.  Cholesterol 130, triglycerides 102, HDL 37, LDL 73.  On the day of discharge, white count 5.1, hemoglobin 12.5, hematocrit 36.9, platelets 123,000.  Sodium 139, potassium 3.6, chloride 107, bicarb 27, glucose 87, BUN 4, creatinine 0.47.  LFTs show total bilirubin of 0.1, alkaline phosphate 43 AST 23, ALT 32, total protein 5.4, serum albumin 2.7, calcium 8.5.  RADIOLOGICAL EXAMINATION:  The patient had a chest x-ray on May 23 that was negative.  Heart size normal.  Lungs are clear.  CT angio of her chest was done on Jun 27, 2010.  Impression demonstrates normal CTA of the chest, no evidence of pulmonary embolism or other acute findings. The patient has no pending microbiology or lab tests.  EKG done Jun 26, 2010 shows normal sinus rhythm, normal EKG.  DISCHARGE MEDICATIONS: 1. Calcium carbonate 600 mg with vitamin D OTC 1 tablet by mouth     daily. 2. Celexa 40 mg 1 tablet by mouth daily. 3. Fish oil 1000 mg 1 capsule mouth daily. 4. Multivitamin 1 tablet by mouth daily. 5. Nexium 40 mg 1 tablet by mouth daily. 6. Oxybutynin 5 mg 1 tablet by mouth daily. 7. Pravachol 20 mg 1 tablet by mouth daily at bedtime. 8. Xanax 0.25 mg 1 tablet by mouth twice  daily.  DISCHARGE INSTRUCTIONS: 1. Increase activity slowly. 2. Resume a regular diet as tolerated. 3. Patient will be called with an appointment for an out patient stress test. 4. Please see Dr. Alwyn Ren, primary care physician in the next 2     weeks.  Please also follow up with Dr. Yancey Flemings in the next 3-4     weeks.  If you have any further chest pain, weakness, or shortness     of breath, please return to the emergency department.     Stephani Police, PA   ______________________________ Marinda Elk, M.D.    MLY/MEDQ  D:  06/28/2010  T:  06/29/2010  Job:  [id number]  cc:   Titus Dubin. Alwyn Ren,  MD,FACP,FCCP Wilhemina Bonito. Marina Goodell, MD  Electronically Signed by Algis Downs PA on 07/11/2010 09:33:55 PM Electronically Signed by Marinda Elk M.D. on 07/16/2010 05:26:13 PM

## 2010-07-17 ENCOUNTER — Ambulatory Visit (INDEPENDENT_AMBULATORY_CARE_PROVIDER_SITE_OTHER)
Admission: RE | Admit: 2010-07-17 | Discharge: 2010-07-17 | Disposition: A | Discharge status: Home / Self Care | Payer: 59 | Source: Ambulatory Visit | Attending: Internal Medicine | Admitting: Internal Medicine

## 2010-07-17 ENCOUNTER — Encounter: Payer: Self-pay | Admitting: Internal Medicine

## 2010-07-17 ENCOUNTER — Ambulatory Visit (INDEPENDENT_AMBULATORY_CARE_PROVIDER_SITE_OTHER): Payer: 59 | Admitting: Internal Medicine

## 2010-07-17 DIAGNOSIS — M545 Low back pain: Secondary | ICD-10-CM

## 2010-07-17 DIAGNOSIS — C439 Malignant melanoma of skin, unspecified: Secondary | ICD-10-CM

## 2010-07-17 MED ORDER — CYCLOBENZAPRINE HCL 5 MG PO TABS: 5.0000 mg | ORAL_TABLET | Freq: Three times a day (TID) | ORAL | Status: DC | PRN

## 2010-07-17 MED ORDER — ACETAMINOPHEN-CODEINE 300-30 MG PO TABS: 1.0000 | ORAL_TABLET | Freq: Four times a day (QID) | ORAL | Status: DC | PRN

## 2010-07-17 NOTE — Patient Instructions (Signed)
Physical Therapy or Chiropractry if symptoms persist

## 2010-07-17 NOTE — Progress Notes (Signed)
  Subjective:    Patient ID: Kristin Rasmussen, female    DOB: 25-Aug-1960, 50 y.o.   MRN: [id number]  HPI BACK PAIN Onset: 3 years ago; flare 2 months ago due to yardwork Location: LS area Worse with: waist flexion  Better with: rest  Radiation: no Trauma: after lifting 06/2007 [place] Fecal/urinary incontinence: no  Numbness/Weakness: yes, occasional radiation into LLE to knee  Fever/chills/sweats: no  Night pain: yes, related to position, better in LLDP  Unexplained weight loss: no    h/o cancer/immunosuppression: yes, melanoma   PMH of osteoporosis or chronic steroid use: no       Review of Systems     Objective:   Physical Exam Gen.: Healthy and well-nourished in appearance. Alert, appropriate and cooperative throughout exam. Lungs: Normal respiratory effort; chest expands symmetrically. Lungs are clear to auscultation without rales, wheezes, or increased work of breathing. Heart: Normal rate and rhythm. Normal S1 and S2. No gallop, click, or rub. S4 w/o murmur. Abdomen: Bowel sounds normal; abdomen soft and nontender. No masses, organomegaly or hernias noted. Musculoskeletal/extremities: No deformity or scoliosis noted of  the thoracic or lumbar spine. No clubbing, cyanosis, edema, or deformity noted. Range of motion  normal .Tone & strength  normal.Joints normal. Nail health  Good. Lay down & sat up w/o help. Neg SLR Vascular: Carotid, radial artery, dorsalis pedis and dorsalis posterior tibial pulses are full and equal. No bruits present. Neurologic: Alert and oriented x3. Deep tendon reflexes symmetrical and normal. Heel & toe gait normal.          Skin: Intact without suspicious lesions or rashes. Lymph: No cervical, axillary, or inguinal lymphadenopathy present. Psych: Mood and affect are normal. Normally interactive                                                                                         Assessment & Plan:  #1 chronic LBS #2 PMH of  Melanoma Plan: LS films Tylenol #3 Flexeril 5 mg 1-2 qhs

## 2010-10-24 ENCOUNTER — Other Ambulatory Visit: Payer: Self-pay | Admitting: Internal Medicine

## 2010-10-25 ENCOUNTER — Encounter: Payer: Self-pay | Admitting: Internal Medicine

## 2010-10-25 ENCOUNTER — Ambulatory Visit (INDEPENDENT_AMBULATORY_CARE_PROVIDER_SITE_OTHER): Payer: 59 | Admitting: Internal Medicine

## 2010-10-25 DIAGNOSIS — E782 Mixed hyperlipidemia: Secondary | ICD-10-CM

## 2010-10-25 DIAGNOSIS — Z85828 Personal history of other malignant neoplasm of skin: Secondary | ICD-10-CM

## 2010-10-25 DIAGNOSIS — C439 Malignant melanoma of skin, unspecified: Secondary | ICD-10-CM

## 2010-10-25 DIAGNOSIS — K219 Gastro-esophageal reflux disease without esophagitis: Secondary | ICD-10-CM

## 2010-10-25 DIAGNOSIS — Z0000 Encounter for general adult medical examination without abnormal findings: Secondary | ICD-10-CM

## 2010-10-25 LAB — CBC WITH DIFFERENTIAL/PLATELET
Basophils Relative: 0.8 % (ref 0.0–3.0)
Eosinophils Relative: 3.4 % (ref 0.0–5.0)
Hemoglobin: 14.8 g/dL (ref 12.0–15.0)
Lymphocytes Relative: 24.2 % (ref 12.0–46.0)
MCV: 90.8 fl (ref 78.0–100.0)
Neutro Abs: 4.1 10*3/uL (ref 1.4–7.7)
Neutrophils Relative %: 66.3 % (ref 43.0–77.0)
RBC: 4.87 Mil/uL (ref 3.87–5.11)
WBC: 6.2 10*3/uL (ref 4.5–10.5)

## 2010-10-25 LAB — LIPID PANEL
HDL: 43.5 mg/dL (ref >39.00)
LDL Cholesterol: 114 mg/dL — ABNORMAL HIGH (ref 0–99)
Total CHOL/HDL Ratio: 4
Triglycerides: 114 mg/dL (ref 0.0–149.0)
VLDL: 22.8 mg/dL (ref 0.0–40.0)

## 2010-10-25 LAB — HEPATIC FUNCTION PANEL
Albumin: 3.9 g/dL (ref 3.5–5.2)
Total Protein: 6.9 g/dL (ref 6.0–8.3)

## 2010-10-25 LAB — BASIC METABOLIC PANEL
Calcium: 8.7 mg/dL (ref 8.4–10.5)
Chloride: 104 mEq/L (ref 96–112)
Creatinine, Ser: 0.5 mg/dL (ref 0.4–1.2)
Sodium: 140 mEq/L (ref 135–145)

## 2010-10-25 NOTE — Patient Instructions (Signed)
Preventive Health Care: Exercise  30-45  minutes a day, 3-4 days a week. Walking is especially valuable in preventing Osteoporosis. Eat a low-fat diet with lots of fruits and vegetables, up to 7-9 servings per day. Avoid obesity; your goal = waist less than 35 inches.Consume less than 30 grams of sugar per day from foods & drinks with High Fructose Corn Syrup as #2,3 or #4 on label. Health Care Power of Attorney & Living Will place you in charge of your health care  decisions. Verify these are  in place. Depression is common in our stressful world. If you're feeling down or losing interest in things you normally enjoy, please call.

## 2010-10-25 NOTE — Progress Notes (Signed)
Subjective:    Patient ID: Kristin Rasmussen, female    DOB: [birth year], 50 y.o.   MRN: [id number]  HPI  Kristin Rasmussen  is here for a physical; she has no acute issues.      Review of Systems Patient reports no significant  vision/ hearing  changes, adenopathy,fever, weight change ,  persistant / recurrent hoarseness , swallowing issues, chest pain,palpitations,edema,persistant /recurrent cough, hemoptysis, dyspnea( rest/ exertional/paroxysmal nocturnal), gastrointestinal bleeding(melena, rectal bleeding), abdominal pain, significant heartburn,  GU symptoms(dysuria, hematuria,pyuria, incontinence ), Gyn symptoms(abnormal  bleeding , pain),  syncope, focal weakness, memory loss,numbness & tingling, skin/hair /nail changes,abnormal bruising or bleeding,or depression.  Dr Marina Goodell has diagnosed IBS; probiotics did not help.Dr Nolen Mu treats her anxiety disorder.     Objective:   Physical Exam Gen.: Healthy and well-nourished in appearance. Alert, appropriate and cooperative throughout exam. Head: Normocephalic without obvious abnormalities;  Hair is very fine Eyes: No corneal or conjunctival inflammation noted. Pupils equal round reactive to light and accommodation. Fundal exam is benign without hemorrhages, exudate, papilledema. Extraocular motion intact. Vision grossly normal with lenses. Ears: External  ear exam reveals no significant lesions or deformities. Canals clear .TMs normal. Hearing is grossly normal bilaterally. Nose: External nasal exam reveals no deformity or inflammation. Nasal mucosa are pink and moist. No lesions or exudates noted.   Mouth: Oral mucosa and oropharynx reveal no lesions or exudates. Teeth in good repair. Neck: No deformities, masses, or tenderness noted. Range of motion &. Thyroid normal. Lungs: Normal respiratory effort; chest expands symmetrically. Lungs are clear to auscultation without rales, wheezes, or increased work of breathing. Heart: Normal rate and rhythm.  Normal S1 and S2. No gallop, click, or rub. S4 w/o  murmur. Abdomen: Bowel sounds normal; abdomen soft and nontender. No masses, organomegaly or hernias noted. Genitalia: Dr Aldona Bar   .                                                                                   Musculoskeletal/extremities: No deformity or scoliosis noted of  the thoracic or lumbar spine; minimal asymmetry of thoracic muscles. No clubbing, cyanosis, edema, or deformity noted. Range of motion  normal .Tone & strength  normal.Joints normal. Nail health  good. Vascular: Carotid, radial artery, dorsalis pedis and  posterior tibial pulses are full and equal. No bruits present. Neurologic: Alert and oriented x3. Deep tendon reflexes symmetrical and normal.          Skin: Intact without suspicious lesions or rashes. Lymph: No cervical, axillary  lymphadenopathy present. Psych: Mood and affect are normal. Normally interactive                                                                                          Assessment & Plan:  #1 comprehensive physical exam; no acute findings #2 see Problem  List with Assessments & Recommendations Plan: see Orders

## 2010-11-01 ENCOUNTER — Telehealth: Payer: Self-pay

## 2010-11-01 MED ORDER — PRAVASTATIN SODIUM 20 MG PO TABS: 20.0000 mg | ORAL_TABLET | Freq: Every day | ORAL | Status: DC

## 2010-11-01 MED ORDER — ESOMEPRAZOLE MAGNESIUM 40 MG PO CPDR: 40.0000 mg | DELAYED_RELEASE_CAPSULE | Freq: Every day | ORAL | Status: DC

## 2010-11-01 NOTE — Telephone Encounter (Signed)
Message copied by Edgardo Roys on Fri Nov 01, 2010 10:55 AM ------      Message from: Pecola Lawless      Created: Thu Oct 31, 2010  5:56 PM       Please refill meds @ 2 separate drug stores X 1 year      ----- Message -----         From: Pecola Lawless, MD         Sent: 10/25/2010   9:19 AM           To: Pecola Lawless, MD            Will need Nexium Ozzie Hoyle , [place]) & statin Dyann Kief [place]) refills

## 2010-11-01 NOTE — Telephone Encounter (Signed)
Patient's husband aware rx sent 

## 2010-12-04 ENCOUNTER — Telehealth: Payer: Self-pay | Admitting: *Deleted

## 2010-12-04 NOTE — Telephone Encounter (Signed)
Prior auth approved unitl 12/04/2011

## 2011-05-20 ENCOUNTER — Encounter: Payer: Self-pay | Admitting: Internal Medicine

## 2011-06-04 ENCOUNTER — Ambulatory Visit (INDEPENDENT_AMBULATORY_CARE_PROVIDER_SITE_OTHER): Payer: 59 | Admitting: Internal Medicine

## 2011-06-04 ENCOUNTER — Encounter: Payer: Self-pay | Admitting: Internal Medicine

## 2011-06-04 VITALS — BP 110/74 | HR 82 | Temp 98.4°F | Wt 191.8 lb

## 2011-06-04 DIAGNOSIS — H6990 Unspecified Eustachian tube disorder, unspecified ear: Secondary | ICD-10-CM

## 2011-06-04 DIAGNOSIS — H6980 Other specified disorders of Eustachian tube, unspecified ear: Secondary | ICD-10-CM

## 2011-06-04 DIAGNOSIS — H6981 Other specified disorders of Eustachian tube, right ear: Secondary | ICD-10-CM

## 2011-06-04 DIAGNOSIS — L0291 Cutaneous abscess, unspecified: Secondary | ICD-10-CM

## 2011-06-04 DIAGNOSIS — L0390 Cellulitis, unspecified: Secondary | ICD-10-CM

## 2011-06-04 MED ORDER — FLUTICASONE PROPIONATE 50 MCG/ACT NA SUSP: 1.0000 | Freq: Two times a day (BID) | NASAL | Status: DC | PRN

## 2011-06-04 MED ORDER — AMOXICILLIN 500 MG PO CAPS: 500.0000 mg | ORAL_CAPSULE | Freq: Three times a day (TID) | ORAL | Status: AC

## 2011-06-04 NOTE — Patient Instructions (Addendum)
Go to [facility] for eustachian tube dysfunction. Drink thin  fluids liberally through the day and chew sugarless gum . Do the Valsalva maneuver several times a day to "pop" ears open. Flonase 1 spray in each nostril twice a day as needed. Use the "crossover" technique as discussed. Use a Neti pot daily- 2x /day  as needed for sinus congestion. ENT consultation if symptoms persist

## 2011-06-04 NOTE — Progress Notes (Signed)
  Subjective:    Patient ID: Kristin Rasmussen, female    DOB: [birth year], 51 y.o.   MRN: [id number]  HPI For at least 6 months she's noted some pressure in the right ear with decreased hearing. It has been stable, but constant. Debrox  Treatment & antihistamines  did not result in significant improvement.  She has not had any discharge, tinnitus, fever, chills, or sweats.    Review of Systems She has had some pressure in the right frontal sinus, but denies significant frontal pain, facial pain, nasal purulence, sore throat, or dental pain. She's not had significant extrinsic symptoms of itchy watery eyes or sneezing.  She has no pain with mastication     Objective:   Physical Exam General appearance:good health ;well nourished; no acute distress or increased work of breathing is present.  No  lymphadenopathy about the head, neck, or axilla noted.   Eyes: No conjunctival inflammation or lid edema is present. Extraocular motion intact. No significant nystagmus. Vision normal with lenses  Ears:  External ear exam shows no significant lesions or deformities.  Otoscopic examination reveals clear canals, tympanic membranes are intact bilaterally without bulging, retraction, inflammation or discharge. There is very faint erythema above the right tympanic membrane; she states this area is tender when touch with the speculum. Whisper at 6 feet bilaterally. Tuning fork exam is normal  Nose:  External nasal examination shows no deformity or inflammation. Nasal mucosa are pink and moist without lesions or exudates. No septal dislocation or deviation.No obstruction to airflow.   Oral exam: Dental hygiene is good; lips and gums are healthy appearing.There is no oropharyngeal erythema or exudate noted. No masseter area tenderness. Clinically no evidence of TMJ   Neck:  No deformities,  masses, or tenderness noted.   Supple with full range of motion without pain.   Heart:  Normal rate and regular rhythm. S1  and S2 normal without gallop, murmur, click, rub or other extra sounds. No carotid bruits present  Lungs:Chest clear to auscultation; no wheezes, rhonchi,rales ,or rubs present.No increased work of breathing.    Skin: Warm & dry          Assessment & Plan:  #1 eustachian tube dysfunction, right ear #2 possible mild cellulitis in the right ear canal superiorly above the tympanic membrane Plan: See orders and recommendations.

## 2011-08-13 ENCOUNTER — Ambulatory Visit (INDEPENDENT_AMBULATORY_CARE_PROVIDER_SITE_OTHER): Payer: PRIVATE HEALTH INSURANCE | Admitting: Sports Medicine

## 2011-08-13 VITALS — BP 121/79 | Ht 63.0 in | Wt 190.0 lb

## 2011-08-13 DIAGNOSIS — M79673 Pain in unspecified foot: Secondary | ICD-10-CM | POA: Insufficient documentation

## 2011-08-13 DIAGNOSIS — M7750 Other enthesopathy of unspecified foot: Secondary | ICD-10-CM

## 2011-08-13 DIAGNOSIS — M79609 Pain in unspecified limb: Secondary | ICD-10-CM

## 2011-08-13 DIAGNOSIS — M722 Plantar fascial fibromatosis: Secondary | ICD-10-CM | POA: Insufficient documentation

## 2011-08-13 DIAGNOSIS — M7740 Metatarsalgia, unspecified foot: Secondary | ICD-10-CM

## 2011-08-13 HISTORY — DX: Plantar fascial fibromatosis: M72.2

## 2011-08-13 NOTE — Patient Instructions (Addendum)
METATARSALGIA PLANTAR FASCIITIS

## 2011-08-13 NOTE — Assessment & Plan Note (Signed)
Started on standard exercises and stretches Use arch strap  Heel pads bilat  Walking more comfortable with these  Ease back into exercise  Reck 8 weeks

## 2011-08-13 NOTE — Progress Notes (Signed)
  Subjective:    Patient ID: Kristin Rasmussen, female    DOB: 1960-06-09, 51 y.o.   MRN: [id number]  HPI Patient w 4 mos of left heel pain Recently some RT heel pain but mostly left  Was doing regular activity but added exercise video with step exercises More stepping and faster pace This flared heel pain  Now pain first step out of bed  New tennis shoes help some  Did see podistriat/ Hx of plantar warts  She is up about 30 lbs over her baseline weight   Review of Systems     Objective:   Physical Exam  NAD  Pleasant and moderately obese  TTP at left heel and moderately at RT Pain over MT 3 to 5 on LT with collapse of trans arch  Mild cavus shape to foot bilat  Trans arch on RT is not collapsed or painful  Gait with feet ext rotated  MSK [place] Left PF is 0.69 and hypoechoic RT PF is 0.46 Spur on Plantar surface of 5th MTP      Assessment & Plan:

## 2011-08-13 NOTE — Assessment & Plan Note (Signed)
Sports insoles with MT pads added  These felt better  Ck this on RTC

## 2011-09-18 ENCOUNTER — Encounter: Payer: Self-pay | Admitting: Sports Medicine

## 2011-09-18 ENCOUNTER — Ambulatory Visit (INDEPENDENT_AMBULATORY_CARE_PROVIDER_SITE_OTHER): Payer: PRIVATE HEALTH INSURANCE | Admitting: Sports Medicine

## 2011-09-18 VITALS — BP 105/71 | HR 83 | Ht 63.0 in | Wt 190.0 lb

## 2011-09-18 DIAGNOSIS — M722 Plantar fascial fibromatosis: Secondary | ICD-10-CM

## 2011-09-18 NOTE — Progress Notes (Signed)
Kristin Rasmussen is a 51 y.o. female who presents to [facility] today for followup bilateral plantar fasciitis.   Patient has had bilateral plantar fascial pain and was last seen on 08/13/2011.  At that point she had taken plantar fascia on ultrasound. She was treated with heel lifts, arch straps, light stretching exercises.  She is approximately 30% improved. She still experiences heel pain with prolonged standing.  However she started to develop to pain.  She feels that her heel lift is forcing her foot forwards.  She is not requiring any medication. Rest improves her pain.  No radiating pain weakness numbness difficulty walking.   PMH reviewed.  History  Substance Use Topics  . Smoking status: Never Smoker   . Smokeless tobacco: Not on file  . Alcohol Use: Yes     rarely   ROS as above otherwise neg   Exam:  BP 105/71  Pulse 83  Ht 5\' 3"  (1.6 m)  Wt 190 lb (86.183 kg)  BMI 33.66 kg/m2 Gen: Well NAD MSK: Right foot: Normal-appearing.   Mild collapse of longitudinal and transverse arch. Nontender over metatarsals. Mildly tender over the medial aspect of the plantar calcaneus. Normal foot motion and strength. Sensation intact as is capillary refill and pulses.  Left Foot Normal-appearing.   Mild collapse of longitudinal and transverse arch. Nontender over metatarsals. Mildly tender over the medial aspect of the plantar calcaneus. Normal foot motion and strength. Sensation intact as is capillary refill and pulses.

## 2011-09-18 NOTE — Patient Instructions (Addendum)
Thank you for coming in today. Please use those insoles. You can buy them from the catalog.  Exercises 1) Toe pull back stretch and massage heel.  2) Heel Lifts. Hand heels off stool and go up. Go back down slowly. Do 15 lifts 3x a day.  3) Come back in 3 months or sooner if needed.   Goal - pain 70% improved. Strong calf muscles.  If something else hurts let us know.

## 2011-09-18 NOTE — Assessment & Plan Note (Signed)
Bilateral plantar fasciitis.  Somewhat improved.  Plan to introduce eccentric heel lifts.  Plan to also introduce foot dorsiflexion with massage of plantar fascia and icing protocol. Agree that the heel lift with new shoes is producing toe pain. Gave a new pair of sports insoles for for new shoes with higher heels.  Followup in 3 months or sooner if worsening

## 2011-10-24 ENCOUNTER — Ambulatory Visit (INDEPENDENT_AMBULATORY_CARE_PROVIDER_SITE_OTHER): Payer: PRIVATE HEALTH INSURANCE | Admitting: Internal Medicine

## 2011-10-24 ENCOUNTER — Encounter: Payer: Self-pay | Admitting: Internal Medicine

## 2011-10-24 VITALS — BP 110/68 | HR 80 | Temp 97.7°F | Resp 12 | Ht 63.08 in | Wt 190.8 lb

## 2011-10-24 DIAGNOSIS — Z0000 Encounter for general adult medical examination without abnormal findings: Secondary | ICD-10-CM

## 2011-10-24 DIAGNOSIS — E785 Hyperlipidemia, unspecified: Secondary | ICD-10-CM

## 2011-10-24 DIAGNOSIS — T887XXA Unspecified adverse effect of drug or medicament, initial encounter: Secondary | ICD-10-CM

## 2011-10-24 DIAGNOSIS — K219 Gastro-esophageal reflux disease without esophagitis: Secondary | ICD-10-CM

## 2011-10-24 LAB — HEPATIC FUNCTION PANEL
ALT: 13 U/L (ref 0–35)
AST: 16 U/L (ref 0–37)
Bilirubin, Direct: 0 mg/dL (ref 0.0–0.3)
Total Bilirubin: 0.5 mg/dL (ref 0.3–1.2)

## 2011-10-24 LAB — CBC WITH DIFFERENTIAL/PLATELET
Eosinophils Relative: 2.1 % (ref 0.0–5.0)
HCT: 43 % (ref 36.0–46.0)
Lymphs Abs: 1.6 10*3/uL (ref 0.7–4.0)
Monocytes Relative: 5 % (ref 3.0–12.0)
Neutrophils Relative %: 70.1 % (ref 43.0–77.0)
Platelets: 194 10*3/uL (ref 150.0–400.0)
WBC: 7 10*3/uL (ref 4.5–10.5)

## 2011-10-24 LAB — LIPID PANEL
LDL Cholesterol: 116 mg/dL — ABNORMAL HIGH (ref 0–99)
Total CHOL/HDL Ratio: 5

## 2011-10-24 LAB — BASIC METABOLIC PANEL
BUN: 7 mg/dL (ref 6–23)
GFR: 141.46 mL/min (ref >60.00)
Potassium: 3.9 mEq/L (ref 3.5–5.1)
Sodium: 135 mEq/L (ref 135–145)

## 2011-10-24 LAB — TSH: TSH: 1.23 u[IU]/mL (ref 0.35–5.50)

## 2011-10-24 MED ORDER — PRAVASTATIN SODIUM 20 MG PO TABS: 20.0000 mg | ORAL_TABLET | Freq: Every day | ORAL | Status: DC

## 2011-10-24 NOTE — Patient Instructions (Addendum)
Preventive Health Care: Exercise  30-45  minutes a day, 3-4 days a week. Walking is especially valuable in preventing Osteoporosis. Eat a low-fat diet with lots of fruits and vegetables, up to 7-9 servings per day.  Consume less than 30 grams of sugar per day from foods & drinks with High Fructose Corn Syrup as #1,2,3 or #4 on label. Health Care Power of Attorney & Living Will place you in charge of your health care  decisions. Verify these are  in place. As per the Standard of Care , screening Colonoscopy recommended @ 50 & every 5-10 years thereafter . More frequent monitor would be dictated by family history or findings @ Colonoscopy.  If you activate My Chart; the results can be released to you as soon as they populate from the lab. If you choose not to use this program; the labs have to be reviewed, copied & mailed causing a delay in getting the results to you.   NOTE:Protein pump inhibitors such as omeprazole or Nexium should not be taken with citalopram at doses higher than 20 mg daily. Options include decreasing the citalopram to 20 mg daily if the Nexium is taken or changing omeprazole to ranitidine 150 mg twice a day before meals . To prevent excessive serotonin levels either the citalopram dose will have to be decreased or Nexium changed to ranitidine . The QT interval  is the " recovery phase" after a heart beat. If that interval is prolonged ( > 460 milliseconds for women or > 440 msec for men ) ; there is increased risk of abnormal heart rate &/or   rhythm which could be associated with health or life threatening issues. This can be caused by genetics; but also certain medications may cause an acquired prolongation of the QT interval.   Examples include : Verapamil, Biaxin , Z pack, Erythromycin, Fluoroquinolones (Levaquin, Cipro ),Citalopram , Lexapro, &  Amitriptyline.   Please discuss this with Dr Nolen Mu.

## 2011-10-24 NOTE — Progress Notes (Signed)
  Subjective:    Patient ID: Kristin Rasmussen, female    DOB: 12/08/1960, 51 y.o.   MRN: [id number]  HPI  Kristin Rasmussen is here for a physical;acute issues involve her feet symptoms for which she sees Dr Kristin Rasmussen      Review of Systems Past medical history/family history/social history were all reviewed and updated.  She has had to decrease her exercise because of the bone spurs and plantar fasciitis. She is on the elliptical for 15-20 minutes 2 times a week. With that program she denies chest pain, palpitations, shortness of breath, claudication, or edema. She is on modified healthy diet. She is compliant with her statin       Objective:   Physical Exam Gen.:  well-nourished in appearance. Alert, appropriate and cooperative throughout exam. Head: Normocephalic without obvious abnormalities; hair fine  Eyes: No corneal or conjunctival inflammation noted. Pupils equal round reactive to light and accommodation. Fundal exam is benign without hemorrhages, exudate, papilledema. Extraocular motion intact. Vision grossly normal with lenses. Ears: External  ear exam reveals no significant lesions or deformities. Canals clear .TMs normal. Hearing is grossly normal bilaterally. Nose: External nasal exam reveals no deformity or inflammation. Nasal mucosa are pink and moist. No lesions or exudates noted.   Mouth: Oral mucosa and oropharynx reveal no lesions or exudates. Teeth in good repair. Neck: No deformities, masses, or tenderness noted. Range of motion & Thyroid normal Lungs: Normal respiratory effort; chest expands symmetrically. Lungs are clear to auscultation without rales, wheezes, or increased work of breathing. Heart: Normal rate and rhythm. Normal S1 and S2. No gallop, click, or rub. S4 with slurring at  LSB ; no murmur. Abdomen: Bowel sounds normal; abdomen soft and nontender. No masses, organomegaly or hernias noted. Genitalia: Dr Kristin Rasmussen, Gyn Musculoskeletal/extremities: Mild lordosis noted of  the  thoracic or lumbar spine. No clubbing, cyanosis, edema, or deformity noted. Range of motion  normal .Tone & strength  normal.Joints normal. Nail health  Good, but great nails absent. Vascular: Carotid, radial artery, dorsalis pedis and  posterior tibial pulses are full and equal. No bruits present. Neurologic: Alert and oriented x3. Deep tendon reflexes symmetrical and normal.          Skin: Intact without suspicious lesions or rashes. Lymph: No cervical, axillary lymphadenopathy present. Psych: Mood and affect are normal. Normally interactive                                                                                       Assessment & Plan:  #1 comprehensive physical exam; no acute findings #2 combined high-dose citalopram and Nexium therapies with potential for QT prolongation. Her QT interval is 392 ms. EKG reveals low voltage due to body habitus. She has a single PAC. She's been asked to discuss the SSRI therapy with Dr. Nolen Rasmussen. Options were discussed. Plan: see Orders

## 2011-10-24 NOTE — Addendum Note (Signed)
Addended byMarga Melnick F on: 10/24/2011 12:14 PM   Modules accepted: Level of Service

## 2011-11-13 ENCOUNTER — Other Ambulatory Visit: Payer: Self-pay | Admitting: Internal Medicine

## 2011-11-13 ENCOUNTER — Encounter: Payer: Self-pay | Admitting: Internal Medicine

## 2011-11-13 NOTE — Telephone Encounter (Signed)
Pt states taking citalopram 40 mg and Nexium 40 mg for over 23yrs with no problems. Pt states you feel theses meds aren't good to take together but pt needs refill for nexium about out.   Plz advise         MW

## 2011-11-13 NOTE — Telephone Encounter (Signed)
Dr.Hopper please advise 

## 2011-11-13 NOTE — Telephone Encounter (Signed)
Protein pump inhibitors such as omeprazole or Nexium cannot be taken with citalopram at doses higher than 20 mg daily. Options include decreasing the citalopram to 20 mg daily if the omeprazole is taken or changing omeprazole to ranitidine 150 mg twice a day before meals . To prevent excessive serotonin levels either the citalopram dose will have to be decreased or Omeprazole changed to ranitidine .   

## 2011-11-13 NOTE — Telephone Encounter (Signed)
See Note re SSRI/PPI interactions

## 2011-11-18 ENCOUNTER — Other Ambulatory Visit: Payer: Self-pay

## 2011-11-18 NOTE — Telephone Encounter (Signed)
I left a message for patient giving her a status update on this medication, prior Auth will not be completed until we correspond with Dr.Parrish Nolen Mu on Citalopram dose for those 2 medications at the current dose can NOT be taken together and the fact that rx for Nexium was sent to pharmacy was an error for Dr.Hopper had denied due to interaction . Patient to call if question or concerns  Letter was faxed to Dr.Mckinney (see letters tab)

## 2011-11-18 NOTE — Telephone Encounter (Signed)
Pt states Rx needs prior authorization haven't taken med since 11/13/11. Pt states Has been trying to get this matter handled for several weeks. Pt states pharmacy has faxed over prior authorization. Plz call pt [phone]  MW

## 2011-11-18 NOTE — Telephone Encounter (Signed)
Nexium 40 mg cannot be prescribed if on citalopram greater than 20 mg daily;this is based on new studies revealing risk of potentially serious cardiac rhythm abnormalities with these doses.  I've asked the pharmacist to discuss this with her. I also discussed it at the physical. The Nexium can be taken if the citalopram is decreased to 20 mg daily; but I  defer to Dr. Nolen Mu as to whether this is appropriate. There are other agents that could be substituted for citalopram but again this has to be determined by Dr. Nolen Mu.

## 2011-11-19 MED ORDER — ESOMEPRAZOLE MAGNESIUM 40 MG PO CPDR: DELAYED_RELEASE_CAPSULE | ORAL | Status: DC

## 2011-11-19 NOTE — Telephone Encounter (Signed)
I called [facility] and spoke with Davina and she confirmed that patient was changed to Citalopram 20 by Dr.Mckinney.  I called [phone] to begin prior Auth and was told this does not require prior auth. I questioned why I was faxed paper stating this medication requires prior auth. I was told to contact the pharmacy from which I received prior auth.   I contacted [facility] back and they stated that their system indicated a prior auth or preferred drug is omeprazole, otherwise patient to pay 215 dollars for Nexium.  I called patient and informed her of the processes I have been though with pharmacy and prior auth caller. Dr.Hopper also spoke with patient and informed her to follow-up with insurance company. Patient agreed that she will follow-up with insurance company. Patient stated she would now like to consider samples. Samples placed at the front for pick-up (patient stated she lives far away and will have a family member pick up samples)

## 2011-11-19 NOTE — Telephone Encounter (Signed)
Kristin Rasmussen spoke with patient and patient indicated that Dr.Mckinney sent in a rx for Citalopram 20 to her pharmacy.  I called [facility] to confirm, no one was available at the time of call, I left message for someone in the pharmacy to return my call.

## 2011-11-19 NOTE — Telephone Encounter (Signed)
I spoke with patient twice today.  Follow up call at 1:15 p.m.  Advised Dr. Alwyn Ren is focused on the quality of her care and her health.  Calls have been placed to Dr. Nolen Mu to discuss pt care plan, which is typical process.  Advised pt  Chrae and Dr. Alwyn Ren are actively working on her prescription/trying to resolve today.  Pt insurance requires preauthorization, and will start the process after discuss w/Dr. Nolen Mu. Patient asked if reduction of Celexa was the question, a new prescription had been called to the [facility] pharmacy in [place] by Dr. Loralie Champagne office.  I advised I would give this information to Chrae (at 1:30 p.m.[name])  Pt asked for a callback from Chrae at day's end and I advised Chrae of same.  Pt requests calls be made to cell [phone].

## 2011-11-19 NOTE — Telephone Encounter (Signed)
I spoke with patient and she stated that she has already spoken with Dr.McKinney and her Citalopram was decreased to 20 mg. Patient state she is out of Nexium, I offered samples and that was not convenient for the patient for she is 1.5 hours away.  Patient was disturbed that this process was not handled at her last CPX and handling it over the phone is a longer process than expected. Patient requested that Dr.Hopper contact her personally, patient also requested to speak with Harriett Sine (Office Manager)-call transferred to [place].  I will begin the prior auth process today, Per Dr.Hopper wait to begin prior Auth, Dr.Hopper would like to speak with Dr.Mckinney first.  I contacted Dr.Mckinney's office and left a message for her to contact Dr.Hopper when she is available.

## 2011-11-20 ENCOUNTER — Telehealth: Payer: Self-pay

## 2011-11-20 NOTE — Telephone Encounter (Signed)
Noted  

## 2011-11-20 NOTE — Telephone Encounter (Signed)
Pt stated her insurance is requiring a copay override , which is a Physicist, medical from [place]. Plz call # [phone]. Pt also states she has tried all the alternatives for Nexium but they haven't worked so pt only wants Nexium. Pt states picked up samples Chrae left for her at front desk so she is willing to wait for Alwyn Ren to return and handle this matter Monday.     MW

## 2011-12-03 ENCOUNTER — Encounter: Payer: Self-pay | Admitting: Internal Medicine

## 2011-12-03 ENCOUNTER — Telehealth: Payer: Self-pay

## 2011-12-03 NOTE — Telephone Encounter (Signed)
Patient called requesting that Dr.Hopper dictate a letter stating her need for Nexium, patient would like to make sure this letter includes all the previous medications she has tried: Omeprazole Prevacid Zantac Protonix Tagamet   Patient would like this letter faxed to [phone]( I verified fax number x 2)  Attention: Annice Pih

## 2011-12-04 NOTE — Telephone Encounter (Signed)
Letter dictated per MD, printed and faxed as requested by patient. I contacted patient on cell and informed her letter faxed, patient confirmed received.

## 2011-12-16 ENCOUNTER — Ambulatory Visit: Payer: PRIVATE HEALTH INSURANCE | Admitting: Sports Medicine

## 2011-12-19 ENCOUNTER — Ambulatory Visit: Payer: PRIVATE HEALTH INSURANCE | Admitting: Family Medicine

## 2011-12-23 ENCOUNTER — Telehealth: Payer: Self-pay | Admitting: *Deleted

## 2011-12-23 ENCOUNTER — Ambulatory Visit (INDEPENDENT_AMBULATORY_CARE_PROVIDER_SITE_OTHER): Payer: PRIVATE HEALTH INSURANCE | Admitting: Sports Medicine

## 2011-12-23 VITALS — BP 122/62 | Ht 63.5 in | Wt 190.0 lb

## 2011-12-23 DIAGNOSIS — M722 Plantar fascial fibromatosis: Secondary | ICD-10-CM

## 2011-12-23 NOTE — Telephone Encounter (Signed)
Patient presented to the office requesting to see all notes concerning discussions with Dr. Alwyn Ren, Harriett Sine and Chrae about the medications she is currently taking (Celexa and Nexium).I advised that she could request a copy of her records if she so desired but she declined.  I reviewed her understanding of all conversations and discussed with her the importance of complying with Dr. Frederik Pear recommendations concerning the lower dose of celexa if she was to continue the nexium. Patient stated that she has already decreased her celexa dose to 20mg  so that she could continue to take Nexium 40mg . Patient stated that she had recently changed insurance companies and that they "giving her a hard time over Nexium". Patient stated that she had been given samples by our office until her new insurance company can get this paperwork finalized. No further questions or concerns. SGJ, RN

## 2011-12-23 NOTE — Patient Instructions (Signed)
Mrs. Corea,  Thank you for coming in today. We are happy to hear that your are 100% improved.   Keep up exercise 3 x/week Wear in soles at all times, order more when needed Wear arch straps for next 3 months.   Follow up as needed  Drs. Evagelia Knack and [employer]

## 2011-12-23 NOTE — Assessment & Plan Note (Signed)
A: bilateral plantar fascitis. Significantly improved.  P:  Continue eccentric heel lift exercises 3x/week indefinitely.  Replaced sports insoles women's 7.5-8.5 and provided patient with order form.  Replaced arch straps with instruction to wear strap for the next 3 months. F/u as needed

## 2011-12-23 NOTE — Progress Notes (Signed)
Subjective:     Patient ID: Kristin Rasmussen, female   DOB: June 01, 1960, 51 y.o.   MRN: [id number]  HPI Ms. Avery is a 51 yo F who present for f/u of bilateral plantar fascitis. She reports 100% improvement. She is compliant with eccentric heel lift exercises 3-4x/week, wearing insoles and arch straps. She has returned to exercising using the elliptical. She denies pain. She has some pressure on the lateral aspect of both feet.   She has been able to extend her exercise up to 20 minutes continuously on the elliptical.  Review of Systems As per HPI     Objective:   Physical Exam BP 122/62  Ht 5' 3.5" (1.613 m)  Wt 190 lb (86.183 kg)  BMI 33.13 kg/m2 General appearance: alert, cooperative and no distress, overweight female Resp: normal work of breathing  Foot exam: Cavus foot with flattened transverse arch.  Patient supinates slightly when standing. No plantar foot pain. Full ROM. 5/5 strength.   No pain on palpation noted today     Assessment and Plan:

## 2012-01-22 ENCOUNTER — Emergency Department (INDEPENDENT_AMBULATORY_CARE_PROVIDER_SITE_OTHER): Payer: PRIVATE HEALTH INSURANCE

## 2012-01-22 ENCOUNTER — Telehealth: Payer: Self-pay | Admitting: Internal Medicine

## 2012-01-22 ENCOUNTER — Emergency Department (HOSPITAL_COMMUNITY)
Admission: EM | Admit: 2012-01-22 | Discharge: 2012-01-22 | Disposition: A | Discharge status: Home / Self Care | Payer: PRIVATE HEALTH INSURANCE | Source: Home / Self Care

## 2012-01-22 ENCOUNTER — Encounter (HOSPITAL_COMMUNITY): Payer: Self-pay | Admitting: *Deleted

## 2012-01-22 DIAGNOSIS — IMO0002 Reserved for concepts with insufficient information to code with codable children: Secondary | ICD-10-CM

## 2012-01-22 DIAGNOSIS — S335XXA Sprain of ligaments of lumbar spine, initial encounter: Secondary | ICD-10-CM

## 2012-01-22 DIAGNOSIS — S8391XA Sprain of unspecified site of right knee, initial encounter: Secondary | ICD-10-CM

## 2012-01-22 DIAGNOSIS — S39012A Strain of muscle, fascia and tendon of lower back, initial encounter: Secondary | ICD-10-CM

## 2012-01-22 MED ORDER — CELECOXIB 100 MG PO CAPS: 100.0000 mg | ORAL_CAPSULE | Freq: Two times a day (BID) | ORAL | Status: DC | PRN

## 2012-01-22 MED ORDER — CYCLOBENZAPRINE HCL 10 MG PO TABS: 10.0000 mg | ORAL_TABLET | Freq: Two times a day (BID) | ORAL | Status: DC | PRN

## 2012-01-22 MED ORDER — HYDROCODONE-ACETAMINOPHEN 5-500 MG PO TABS: 1.0000 | ORAL_TABLET | Freq: Three times a day (TID) | ORAL | Status: DC | PRN

## 2012-01-22 NOTE — ED Provider Notes (Signed)
History     CSN: [id number]  Arrival date & time 01/22/12  1013   None     Chief Complaint  Patient presents with  . Fall    (Consider location/radiation/quality/duration/timing/severity/associated sxs/prior treatment) HPI Comments: 51 year old female with history of lumbar spondylosis here complaining of right low back pain for 4 days after falling down at the entrance ramp at home. Patient states that she slipped and landed on the right side of her low back and also in the process bended her right knee. Right knee also hurting able to bear weight but reports discomfort or in the right lower back and right knee with walking. Also pain with bending or moving her torso. Patient has been using over-the-counter medications like Advil and Tylenol without significant relief. She had lumbar x-rays last year for chronic low back pain does show mild spondylosis. Denies extremity, chest or head injury. Denies pain radiation to the lower extremity. Denies lower extremity numbness, weakness or paresthesia.     Past Medical History  Diagnosis Date  . Thrombocytopenia 2002    133,000 platelets  . GERD (gastroesophageal reflux disease)   . Diverticulosis   . Basal cell cancer     x3; Dr Danella Deis  . Melanoma     x1  . Hyperlipidemia   . IBS (irritable bowel syndrome)   . Spasm of esophagus   . Anemia 2004    HCT 34.7  . Chest pain 06/2010    Costochondritis    Past Surgical History  Procedure Date  . Melanoma excision     LUE 2006  [facility]; Derm : Dr Campbell Stall  . Cholecystectomy 2002    [facility]  . Tonsillectomy   . Colonoscopy 2000 & 2007    Dr Marina Goodell; due 2013  . Total abdominal hysterectomy 1991    abnormal PAP    Family History  Problem Relation Age of Onset  . Colon cancer Paternal Grandfather   . Hypertension Father   . Colon polyps Father   . GER disease Father   . Cancer Father     skin cancer  . Ovarian cancer Paternal Aunt   . Diabetes Mother   . Hypertension Mother   .  Heart attack Maternal Grandmother 76  . Diabetes Maternal Grandmother   . Heart attack Maternal Grandfather 69  . Stroke Maternal Grandfather 55  . Colon cancer Paternal Grandfather     History  Substance Use Topics  . Smoking status: Never Smoker   . Smokeless tobacco: Not on file  . Alcohol Use: Yes     Comment: rarely    OB History    Grav Para Term Preterm Abortions TAB SAB Ect Mult Living                  Review of Systems  Genitourinary: Negative for dysuria, frequency, hematuria, flank pain, vaginal bleeding, vaginal discharge and pelvic pain.  Musculoskeletal: Negative for joint swelling.       Low back pain and right knee pain as per HPI  Skin: Negative for rash and wound.  Neurological: Negative for weakness and numbness.  All other systems reviewed and are negative.    Allergies  Review of patient's allergies indicates no known allergies.  Home Medications   Current Outpatient Rx  Name  Route  Sig  Dispense  Refill  . ALPRAZOLAM 0.25 MG PO TABS   Oral   Take 12.5 mg by mouth 2 (two) times daily.           [name]  CALCIUM CARBONATE-VITAMIN D 600-200 MG-UNIT PO TABS   Oral   Take 1 tablet by mouth daily.           . CELECOXIB 100 MG PO CAPS   Oral   Take 1 capsule (100 mg total) by mouth 2 (two) times daily as needed for pain.   20 capsule   0   . CITALOPRAM HYDROBROMIDE 20 MG PO TABS   Oral   Take 20 mg by mouth daily.         . CYCLOBENZAPRINE HCL 10 MG PO TABS   Oral   Take 1 tablet (10 mg total) by mouth 2 (two) times daily as needed for muscle spasms.   20 tablet   0   . ESOMEPRAZOLE MAGNESIUM 40 MG PO CPDR      TAKE ONE CAPSULE BY MOUTH ONE TIME DAILY   30 capsule   11     Hold til patient request   . OMEGA-3 FATTY ACIDS 1000 MG PO CAPS   Oral   Take 1 g by mouth daily.          [name] FLUTICASONE PROPIONATE 50 MCG/ACT NA SUSP   Nasal   Place 1 spray into the nose 2 (two) times daily as needed for rhinitis.   16 g   2   .  HYDROCODONE-ACETAMINOPHEN 5-500 MG PO TABS   Oral   Take 1 tablet by mouth every 8 (eight) hours as needed for pain.   15 tablet   0   . ONE-DAILY MULTI VITAMINS PO TABS   Oral   Take 1 tablet by mouth daily.           . OXYBUTYNIN CHLORIDE ER 5 MG PO TB24   Oral   Take 5 mg by mouth daily.           [name] PRAVASTATIN SODIUM 20 MG PO TABS   Oral   Take 1 tablet (20 mg total) by mouth daily.   90 tablet   3     BP 130/80  Pulse 72  Temp 98.6 F (37 C) (Oral)  SpO2 100%  Physical Exam  Nursing note and vitals reviewed. Constitutional: She is oriented to person, place, and time. She appears well-developed and well-nourished. No distress.  HENT:  Head: Normocephalic and atraumatic.  Neck: Normal range of motion. Neck supple.  Cardiovascular: Normal rate, regular rhythm and normal heart sounds.   Pulmonary/Chest: Effort normal and breath sounds normal. She exhibits no tenderness.  Abdominal: Soft. There is no tenderness.       No CVT  Musculoskeletal:       Central spine with no scoliosis or kyphosis. Fair anterior flexion and posterior extension. Patient able to walk on tip toes and heels with no difficulty foot drop or pain exacerbation. No bone prominence tenderness. Tenderness to palpation in right lumbar paravertebral muscles.  Negative straight leg test bilateral. Intact sensation and symmetric + DTRs (rotullian and achillean) in low extremities.  Right knee: No obvious swelling or deformity. No redness or increased temperature. No palpable effusion. Reported pain with palpation below and medial to the patella. No hyper laxity on stress valgo or varus. No crepitus with flexion and extension. Minimal discomfort reported with active and passive movement. Negative drawer test. Do not feel Baker's cyst. Right lower extremity with normal superficial sensation. Also concerned dorsal pedal and tibial posterior pulses. Patient is weightbearing,  Reports minimal discomfort  in right knee with walking, no limping.   Neurological:  She is alert and oriented to person, place, and time.  Skin: No rash noted. She is not diaphoretic.    ED Course  Procedures (including critical care time)  Labs Reviewed - No data to display Dg Lumbar Spine Complete  01/22/2012  *RADIOLOGY REPORT*  Clinical Data: Pt fell 4 days ago and injured her lower back; pain right side lower back  LUMBAR SPINE - COMPLETE 4+ VIEW  Comparison: 07/17/2010  Findings: Prominence of stool throughout the colon suggests constipation.  Mild facet arthropathy noted on the right side at L4- 5 and L5-S1.  No significant loss of intervertebral disc height. Focal superior endplate can cavity anteriorly at the L1 level is new compared the prior exams, and could be due to an interval Schmorl's node or conceivably a subtle anterior superior endplate compression fracture.  No malalignment.  IMPRESSION:  1.  Accentuated anterior spurring with subtle anterior superior endplate can cavity at L1, new compared the prior, favoring Schmorl's node but conceivably representing a subtle age indeterminate interval anterior superior endplate compression fracture. 2.  Mild facet arthropathy on the right in the lower lumbar spine. 3. Prominence of stool throughout the colon suggests constipation.   Original Report Authenticated By: Gaylyn Rong, M.D.    Dg Knee Complete 4 Views Right  01/22/2012  *RADIOLOGY REPORT*  Clinical Data: Fall, right knee pain  RIGHT KNEE - COMPLETE 4+ VIEW  Comparison: None.  Findings: Four views of the right knee submitted.  No acute fracture or subluxation.  Minimal narrowing of medial joint compartment.  No joint effusion.  IMPRESSION: No acute fracture or subluxation.   Original Report Authenticated By: Natasha Mead, M.D.      1. Low back strain   2. Sprain of right knee       MDM  No bone injury or effusionon on knee X-rays. Reccommended to wear elastic band support,  antiinflammatories and  pain medications as below. Mild new inflammatory changes in lumbar spine compared with prior X-rays. No acute bone injuries. Prescribed  Flexeril, celebrex and vicodin. Supportive care including back rehab exercises and red flags that should prompt her return to medical attention discussed with patient and provided in writing.        Sharin Grave, MD 01/23/12 1413

## 2012-01-22 NOTE — ED Notes (Signed)
Pt  Reports            Back  Pain     She  States  She  Felled  Down a  Ramp        About  4  Days  Ago    she  States   She  Has  Had    Back  Problems  In  Past         She  Reports  Pain  For  sev  Days    Worse  This  Am  When  She  Woke  Up  She  Re[ports  It  Hurts  To move

## 2012-01-22 NOTE — ED Notes (Signed)
Pt changing into gown for xrays

## 2012-01-22 NOTE — Telephone Encounter (Signed)
Patient Information:  Caller Name: Benelli  Phone: [phone]  Patient: Kristin Rasmussen, Kristin Rasmussen  Gender: Female  DOB: March 11, 1960  Age: 51 Years  PCP: N/A  Office Follow Up:  Does the office need to follow up with this patient?: Yes  Instructions For The Office: FYI only: Be aware that due to appointments being full and she has had continued back pain since 12/15; patient advised to go to [facility] for X-rays and to follow up with office for care there after.  RN Note:  Was going to walk down a ramp at her home, slipped falling with right leg folding under her-landing on right side. Was jarred very hard.  Always has low chronic back but this jar has elevated the pain. Patient is sore, rates pain as 7/10 pain scale.  With it slightly less if still.  No position really helps.  Sometimes it radiates down leg.  Disposition is to go to Office Now, all appointments are booked.  Advised to go to [facility] then to contact office for follow up care.  Caller demonstrated understanding.  Symptoms  Reason For Call & Symptoms: Larey Seat this past Sunday and hurt back.  Has had back issues in past. Wants to be seen.  Has had back problems in the past  Reviewed Health History In EMR: Yes  Reviewed Medications In EMR: Yes  Reviewed Allergies In EMR: Yes  Reviewed Surgeries / Procedures: Yes  Date of Onset of Symptoms: 01/18/2012  Treatments Tried: Heating pad, Icy Hot and other home treatments  Treatments Tried Worked: No  Guideline(s) Used:  Back Injury  Disposition Per Guideline:   Go to Office Now  Reason For Disposition Reached:   Pain radiates into the thigh or further down the leg now  Advice Given:  Apply Heat to the Area:  Beginning 48 hours after an injury, apply a warm washcloth or heating pad for 10 minutes three times a day.  This will help increase blood flow and improve healing.  Rest vs. Movement:  Movement of the back is generally more healing in the long term than rest.  Continue normal activities as much as your pain permits.  Rest should only be used for the first couple days after an injury.  Call Back If:  You become worse.  RN Overrode Recommendation:  Go To [facility]  All appointments are full.  Patient has been in pain since fall on Sunday.  She is going to [facility] on [facility] and will follow up with office for additional care.

## 2012-03-24 ENCOUNTER — Encounter: Payer: Self-pay | Admitting: Internal Medicine

## 2012-05-17 ENCOUNTER — Ambulatory Visit (AMBULATORY_SURGERY_CENTER): Payer: PRIVATE HEALTH INSURANCE | Admitting: *Deleted

## 2012-05-17 VITALS — Ht 63.5 in | Wt 198.0 lb

## 2012-05-17 DIAGNOSIS — Z1211 Encounter for screening for malignant neoplasm of colon: Secondary | ICD-10-CM

## 2012-05-17 MED ORDER — MOVIPREP 100 G PO SOLR: ORAL | Status: DC

## 2012-05-18 ENCOUNTER — Encounter: Payer: Self-pay | Admitting: Internal Medicine

## 2012-05-31 ENCOUNTER — Ambulatory Visit (AMBULATORY_SURGERY_CENTER): Payer: PRIVATE HEALTH INSURANCE | Admitting: Internal Medicine

## 2012-05-31 ENCOUNTER — Encounter: Payer: Self-pay | Admitting: Internal Medicine

## 2012-05-31 VITALS — BP 123/70 | HR 80 | Temp 96.7°F | Resp 12 | Ht 63.0 in | Wt 198.0 lb

## 2012-05-31 DIAGNOSIS — Z800 Family history of malignant neoplasm of digestive organs: Secondary | ICD-10-CM

## 2012-05-31 DIAGNOSIS — Z1211 Encounter for screening for malignant neoplasm of colon: Secondary | ICD-10-CM

## 2012-05-31 MED ORDER — SODIUM CHLORIDE 0.9 % IV SOLN: 500.0000 mL | INTRAVENOUS | Status: DC

## 2012-05-31 NOTE — Progress Notes (Signed)
Patient did not experience any of the following events: a burn prior to discharge; a fall within the facility; wrong site/side/patient/procedure/implant event; or a hospital transfer or hospital admission upon discharge from the facility. (G8907) Patient did not have preoperative order for IV antibiotic SSI prophylaxis. (G8918)  

## 2012-05-31 NOTE — Op Note (Signed)
 [facility] [address] [place], [address]   COLONOSCOPY PROCEDURE REPORT  PATIENT: Kristin Rasmussen, Kristin Rasmussen  MR#: [id number] BIRTHDATE: 05-11-1960 , 51  yrs. old GENDER: Female ENDOSCOPIST: Roxy Cedar, MD REFERRED XB:[id number] Recall, M.D. PROCEDURE DATE:  05/31/2012 PROCEDURE:   Colonoscopy, screening ASA CLASS:   Class II INDICATIONS:Patient's family history of colon polyps (father) and Patient's grandparent w/ history of colon cancer. MEDICATIONS: MAC sedation, administered by CRNA and propofol (Diprivan) 300mg  IV  DESCRIPTION OF PROCEDURE:   After the risks benefits and alternatives of the procedure were thoroughly explained, informed consent was obtained.  A digital rectal exam revealed no abnormalities of the rectum.   The LB [id number]-[id number]  endoscope was introduced through the anus and advanced to the cecum, which was identified by both the appendix and ileocecal valve. No adverse events experienced.   The quality of the prep was good, using MoviPrep  The instrument was then slowly withdrawn as the colon was fully examined.      COLON FINDINGS: Mild diverticulosis was noted in the sigmoid colon. The colon was otherwise normal.  There was no inflammation, polyps or cancers .  Retroflexed views revealed no abnormalities. The time to cecum=5 minutes 57 seconds.  Withdrawal time=8 minutes 22 seconds.  The scope was withdrawn and the procedure completed. COMPLICATIONS: There were no complications.  ENDOSCOPIC IMPRESSION: 1.   Mild diverticulosis was noted in the sigmoid colon 2.   The colon was otherwise normal  RECOMMENDATIONS: 1. Continue current colorectal screening recommendations with a repeat colonoscopy in 10 years.   eSigned:  Roxy Cedar, MD 05/31/2012 9:45 AM   cc: The Patient and Pecola Lawless, MD

## 2012-05-31 NOTE — Patient Instructions (Addendum)

## 2012-06-01 ENCOUNTER — Telehealth: Payer: Self-pay | Admitting: *Deleted

## 2012-06-01 NOTE — Telephone Encounter (Signed)
   Follow up Call-  Call back number 05/31/2012  Post procedure Call Back phone  # [phone]  Permission to leave phone message Yes     Patient questions:  Do you have a fever, pain , or abdominal swelling? no Pain Score  0 *  Have you tolerated food without any problems? yes  Have you been able to return to your normal activities? yes  Do you have any questions about your discharge instructions: Diet   no Medications  no Follow up visit  no  Do you have questions or concerns about your Care? no  Actions: * If pain score is 4 or above: No action needed, pain <4.

## 2012-10-29 ENCOUNTER — Encounter: Payer: PRIVATE HEALTH INSURANCE | Admitting: Internal Medicine

## 2012-12-01 ENCOUNTER — Ambulatory Visit (INDEPENDENT_AMBULATORY_CARE_PROVIDER_SITE_OTHER): Payer: PRIVATE HEALTH INSURANCE | Admitting: Internal Medicine

## 2012-12-01 ENCOUNTER — Other Ambulatory Visit (INDEPENDENT_AMBULATORY_CARE_PROVIDER_SITE_OTHER): Payer: PRIVATE HEALTH INSURANCE

## 2012-12-01 ENCOUNTER — Encounter: Payer: Self-pay | Admitting: Internal Medicine

## 2012-12-01 VITALS — BP 112/80 | HR 74 | Temp 98.2°F | Ht 63.5 in | Wt 192.4 lb

## 2012-12-01 DIAGNOSIS — Z23 Encounter for immunization: Secondary | ICD-10-CM

## 2012-12-01 DIAGNOSIS — E785 Hyperlipidemia, unspecified: Secondary | ICD-10-CM

## 2012-12-01 DIAGNOSIS — F411 Generalized anxiety disorder: Secondary | ICD-10-CM

## 2012-12-01 DIAGNOSIS — E782 Mixed hyperlipidemia: Secondary | ICD-10-CM

## 2012-12-01 DIAGNOSIS — K219 Gastro-esophageal reflux disease without esophagitis: Secondary | ICD-10-CM

## 2012-12-01 DIAGNOSIS — Z0000 Encounter for general adult medical examination without abnormal findings: Secondary | ICD-10-CM

## 2012-12-01 DIAGNOSIS — C439 Malignant melanoma of skin, unspecified: Secondary | ICD-10-CM

## 2012-12-01 DIAGNOSIS — F419 Anxiety disorder, unspecified: Secondary | ICD-10-CM

## 2012-12-01 LAB — CBC WITH DIFFERENTIAL/PLATELET
Basophils Absolute: 0 K/uL (ref 0.0–0.1)
Basophils Relative: 0.3 % (ref 0.0–3.0)
Eosinophils Absolute: 0.1 K/uL (ref 0.0–0.7)
Eosinophils Relative: 1.8 % (ref 0.0–5.0)
HCT: 44.4 % (ref 36.0–46.0)
Hemoglobin: 15.2 g/dL — ABNORMAL HIGH (ref 12.0–15.0)
Lymphocytes Relative: 23.2 % (ref 12.0–46.0)
Lymphs Abs: 1.7 K/uL (ref 0.7–4.0)
MCHC: 34.2 g/dL (ref 30.0–36.0)
MCV: 87.6 fl (ref 78.0–100.0)
Monocytes Absolute: 0.4 K/uL (ref 0.1–1.0)
Monocytes Relative: 5.4 % (ref 3.0–12.0)
Neutro Abs: 5.1 K/uL (ref 1.4–7.7)
Neutrophils Relative %: 69.3 % (ref 43.0–77.0)
Platelets: 212 K/uL (ref 150.0–400.0)
RBC: 5.07 Mil/uL (ref 3.87–5.11)
RDW: 14.4 % (ref 11.5–14.6)
WBC: 7.4 K/uL (ref 4.5–10.5)

## 2012-12-01 LAB — URINALYSIS, ROUTINE W REFLEX MICROSCOPIC
Nitrite: NEGATIVE
Total Protein, Urine: NEGATIVE
Urine Glucose: NEGATIVE
pH: 6.5 (ref 5.0–8.0)

## 2012-12-01 LAB — LIPID PANEL
Cholesterol: 184 mg/dL (ref 0–200)
LDL Cholesterol: 117 mg/dL — ABNORMAL HIGH (ref 0–99)
Triglycerides: 124 mg/dL (ref 0.0–149.0)
VLDL: 24.8 mg/dL (ref 0.0–40.0)

## 2012-12-01 LAB — BASIC METABOLIC PANEL
BUN: 10 mg/dL (ref 6–23)
Chloride: 101 mEq/L (ref 96–112)
GFR: 113.67 mL/min (ref >60.00)
Glucose, Bld: 98 mg/dL (ref 70–99)
Potassium: 4.5 mEq/L (ref 3.5–5.1)

## 2012-12-01 LAB — HEPATIC FUNCTION PANEL
ALT: 14 U/L (ref 0–35)
Albumin: 3.7 g/dL (ref 3.5–5.2)
Bilirubin, Direct: 0.1 mg/dL (ref 0.0–0.3)
Total Protein: 6.8 g/dL (ref 6.0–8.3)

## 2012-12-01 MED ORDER — PRAVASTATIN SODIUM 20 MG PO TABS: 20.0000 mg | ORAL_TABLET | Freq: Every day | ORAL | Status: DC

## 2012-12-01 MED ORDER — ESOMEPRAZOLE MAGNESIUM 40 MG PO CPDR: DELAYED_RELEASE_CAPSULE | ORAL | Status: DC

## 2012-12-01 NOTE — Patient Instructions (Addendum)
It was good to see you today.  Your annual flu shot was given and/or updated today.  We have reviewed your prior records including labs and tests today  Health Maintenance reviewed - all recommended immunizations and age-appropriate screenings are up-to-date.  Test(s) ordered today. Your results will be released to MyChart (or called to you) after review, usually within 72hours after test completion. If any changes need to be made, you will be notified at that same time.  Medications reviewed and updated, no changes recommended at this time. Refill on medication(s) as discussed today.  Continue working with other specialists as reviewed and as ongoing  Followup in one year for annual wellness exam and labs, please call sooner if problems  Health Maintenance, Females A healthy lifestyle and preventative care can promote health and wellness.  Maintain regular health, dental, and eye exams.  Eat a healthy diet. Foods like vegetables, fruits, whole grains, low-fat dairy products, and lean protein foods contain the nutrients you need without too many calories. Decrease your intake of foods high in solid fats, added sugars, and salt. Get information about a proper diet from your caregiver, if necessary.  Regular physical exercise is one of the most important things you can do for your health. Most adults should get at least 150 minutes of moderate-intensity exercise (any activity that increases your heart rate and causes you to sweat) each week. In addition, most adults need muscle-strengthening exercises on 2 or more days a week.   Maintain a healthy weight. The body mass index (BMI) is a screening tool to identify possible weight problems. It provides an estimate of body fat based on height and weight. Your caregiver can help determine your BMI, and can help you achieve or maintain a healthy weight. For adults 20 years and older:  A BMI below 18.5 is considered underweight.  A BMI of 18.5  to 24.9 is normal.  A BMI of 25 to 29.9 is considered overweight.  A BMI of 30 and above is considered obese.  Maintain normal blood lipids and cholesterol by exercising and minimizing your intake of saturated fat. Eat a balanced diet with plenty of fruits and vegetables. Blood tests for lipids and cholesterol should begin at age 67 and be repeated every 5 years. If your lipid or cholesterol levels are high, you are over 50, or you are a high risk for heart disease, you may need your cholesterol levels checked more frequently.Ongoing high lipid and cholesterol levels should be treated with medicines if diet and exercise are not effective.  If you smoke, find out from your caregiver how to quit. If you do not use tobacco, do not start.  If you are pregnant, do not drink alcohol. If you are breastfeeding, be very cautious about drinking alcohol. If you are not pregnant and choose to drink alcohol, do not exceed 1 drink per day. One drink is considered to be 12 ounces (355 mL) of beer, 5 ounces (148 mL) of wine, or 1.5 ounces (44 mL) of liquor.  Avoid use of street drugs. Do not share needles with anyone. Ask for help if you need support or instructions about stopping the use of drugs.  High blood pressure causes heart disease and increases the risk of stroke. Blood pressure should be checked at least every 1 to 2 years. Ongoing high blood pressure should be treated with medicines, if weight loss and exercise are not effective.  If you are 29 to 52 years old, ask your caregiver  if you should take aspirin to prevent strokes.  Diabetes screening involves taking a blood sample to check your fasting blood sugar level. This should be done once every 3 years, after age 52, if you are within normal weight and without risk factors for diabetes. Testing should be considered at a younger age or be carried out more frequently if you are overweight and have at least 1 risk factor for diabetes.  Breast cancer  screening is essential preventative care for women. You should practice "breast self-awareness." This means understanding the normal appearance and feel of your breasts and may include breast self-examination. Any changes detected, no matter how small, should be reported to a caregiver. Women in their 41s and 30s should have a clinical breast exam (CBE) by a caregiver as part of a regular health exam every 1 to 3 years. After age 48, women should have a CBE every year. Starting at age 53, women should consider having a mammogram (breast X-ray) every year. Women who have a family history of breast cancer should talk to their caregiver about genetic screening. Women at a high risk of breast cancer should talk to their caregiver about having an MRI and a mammogram every year.  The Pap test is a screening test for cervical cancer. Women should have a Pap test starting at age 22. Between ages 18 and 29, Pap tests should be repeated every 2 years. Beginning at age 64, you should have a Pap test every 3 years as long as the past 3 Pap tests have been normal. If you had a hysterectomy for a problem that was not cancer or a condition that could lead to cancer, then you no longer need Pap tests. If you are between ages 45 and 4, and you have had normal Pap tests going back 10 years, you no longer need Pap tests. If you have had past treatment for cervical cancer or a condition that could lead to cancer, you need Pap tests and screening for cancer for at least 20 years after your treatment. If Pap tests have been discontinued, risk factors (such as a new sexual partner) need to be reassessed to determine if screening should be resumed. Some women have medical problems that increase the chance of getting cervical cancer. In these cases, your caregiver may recommend more frequent screening and Pap tests.  The human papillomavirus (HPV) test is an additional test that may be used for cervical cancer screening. The HPV test  looks for the virus that can cause the cell changes on the cervix. The cells collected during the Pap test can be tested for HPV. The HPV test could be used to screen women aged 76 years and older, and should be used in women of any age who have unclear Pap test results. After the age of 5, women should have HPV testing at the same frequency as a Pap test.  Colorectal cancer can be detected and often prevented. Most routine colorectal cancer screening begins at the age of 69 and continues through age 69. However, your caregiver may recommend screening at an earlier age if you have risk factors for colon cancer. On a yearly basis, your caregiver may provide home test kits to check for hidden blood in the stool. Use of a small camera at the end of a tube, to directly examine the colon (sigmoidoscopy or colonoscopy), can detect the earliest forms of colorectal cancer. Talk to your caregiver about this at age 14, when routine screening begins. Direct  examination of the colon should be repeated every 5 to 10 years through age 19, unless early forms of pre-cancerous polyps or small growths are found.  Hepatitis C blood testing is recommended for all people born from [birth year] through 1965 and any individual with known risks for hepatitis C.  Practice safe sex. Use condoms and avoid high-risk sexual practices to reduce the spread of sexually transmitted infections (STIs). Sexually active women aged 64 and younger should be checked for Chlamydia, which is a common sexually transmitted infection. Older women with new or multiple partners should also be tested for Chlamydia. Testing for other STIs is recommended if you are sexually active and at increased risk.  Osteoporosis is a disease in which the bones lose minerals and strength with aging. This can result in serious bone fractures. The risk of osteoporosis can be identified using a bone density scan. Women ages 3 and over and women at risk for fractures or  osteoporosis should discuss screening with their caregivers. Ask your caregiver whether you should be taking a calcium supplement or vitamin D to reduce the rate of osteoporosis.  Menopause can be associated with physical symptoms and risks. Hormone replacement therapy is available to decrease symptoms and risks. You should talk to your caregiver about whether hormone replacement therapy is right for you.  Use sunscreen with a sun protection factor (SPF) of 30 or greater. Apply sunscreen liberally and repeatedly throughout the day. You should seek shade when your shadow is shorter than you. Protect yourself by wearing long sleeves, pants, a wide-brimmed hat, and sunglasses year round, whenever you are outdoors.  Notify your caregiver of new moles or changes in moles, especially if there is a change in shape or color. Also notify your caregiver if a mole is larger than the size of a pencil eraser.  Stay current with your immunizations. Document Released: 08/05/2010 Document Revised: 04/14/2011 Document Reviewed: 08/05/2010 [facility] Patient Information 2014 [place], [place].

## 2012-12-01 NOTE — Assessment & Plan Note (Signed)
Excision 2006 reviewed Now follows with derm annually Danella Deis)

## 2012-12-01 NOTE — Assessment & Plan Note (Addendum)
Chronic, follows with [facility] for same Change from celexa to lexapro 2013 - titration and adjustment ongoing Uses xanax scheduled - managed by psyc Reports symptoms stable at this time

## 2012-12-01 NOTE — Assessment & Plan Note (Signed)
On prava Check lipids annually and titrate as needed

## 2012-12-01 NOTE — Assessment & Plan Note (Signed)
Chronic symptoms, last EGD with Marina Goodell noted symptoms controlled with Nexium The current medical regimen is effective;  continue present plan and medications.

## 2012-12-01 NOTE — Progress Notes (Signed)
Subjective:    Patient ID: Kristin Rasmussen, female    DOB: 06/09/1960, 52 y.o.   MRN: [id number]  HPI New to me, but known to our practice - transfer to [facility]) for location  patient is here today for annual physical. Patient feels well and has no complaints.  Also reviewed chronic medical issues and interval medical events since last annual  Past Medical History  Diagnosis Date  . Thrombocytopenia 2002    133,000 platelets  . GERD (gastroesophageal reflux disease)   . Diverticulosis   . Basal cell cancer     x3; Dr Danella Deis  . Melanoma 2006    x1  . Hyperlipidemia   . IBS (irritable bowel syndrome)   . Spasm of esophagus   . Anemia 2004    HCT 34.7  . Chest pain 06/2010    Costochondritis  . Plantar fasciitis 08/13/2011    Left is significant and RT is mild by [place] criteria   . Anxiety     follows with psyc for same   Family History  Problem Relation Age of Onset  . Hypertension Father   . Colon polyps Father   . GER disease Father   . Skin cancer Father   . Ovarian cancer Paternal Aunt   . Diabetes Mother   . Hypertension Mother   . Heart attack Maternal Grandmother 76  . Diabetes Maternal Grandmother   . Heart attack Maternal Grandfather 69  . Stroke Maternal Grandfather 32  . Colon cancer Paternal Grandfather     unsure age   History  Substance Use Topics  . Smoking status: Never Smoker   . Smokeless tobacco: Never Used  . Alcohol Use: Yes     Comment: rarely,only holiday's    Review of Systems  Constitutional: Negative for fatigue and unexpected weight change.  Respiratory: Negative for cough, shortness of breath and wheezing.   Cardiovascular: Negative for chest pain, palpitations and leg swelling.  Gastrointestinal: Negative for nausea, abdominal pain and diarrhea.  Neurological: Negative for dizziness, weakness, light-headedness and headaches.  Psychiatric/Behavioral: Negative for dysphoric mood. The patient is not  nervous/anxious.   All other systems reviewed and are negative.       Objective:   Physical Exam BP 112/80  Pulse 74  Temp(Src) 98.2 F (36.8 C) (Oral)  Ht 5' 3.5" (1.613 m)  Wt 192 lb 6.4 oz (87.272 kg)  BMI 33.54 kg/m2  SpO2 96% Wt Readings from Last 3 Encounters:  12/01/12 192 lb 6.4 oz (87.272 kg)  05/31/12 198 lb (89.812 kg)  05/17/12 198 lb (89.812 kg)   Constitutional: She is overweight, but appears well-developed and well-nourished. No distress.  HENT: Head: Normocephalic and atraumatic. Ears: B TMs ok, no erythema or effusion; Nose: Nose normal. Mouth/Throat: Oropharynx is clear and moist. No oropharyngeal exudate.  Eyes: Conjunctivae and EOM are normal. Pupils are equal, round, and reactive to light. No scleral icterus.  Neck: Normal range of motion. Neck supple. No JVD present. No thyromegaly present.  Cardiovascular: Normal rate, regular rhythm and normal heart sounds.  No murmur heard. No BLE edema. Pulmonary/Chest: Effort normal and breath sounds normal. No respiratory distress. She has no wheezes.  Abdominal: Soft. Bowel sounds are normal. She exhibits no distension. There is no tenderness. no masses Musculoskeletal: Normal range of motion, no joint effusions. No gross deformities Neurological: She is alert and oriented to person, place, and time. No cranial nerve deficit. Coordination, balance, strength, speech and gait are normal.  Skin: fair skin. Well-healed wide excision on left forearm from prior melanoma. Skin is warm and dry. No rash noted. No erythema.  Psychiatric: She has a normal mood and affect. Her behavior is normal. Judgment and thought content normal.   Lab Results  Component Value Date   WBC 7.0 10/24/2011   HGB 14.5 10/24/2011   HCT 43.0 10/24/2011   PLT 194.0 10/24/2011   GLUCOSE 92 10/24/2011   CHOL 181 10/24/2011   TRIG 131.0 10/24/2011   HDL 38.40* 10/24/2011   LDLDIRECT 156.9 01/20/2008   LDLCALC 116* 10/24/2011   ALT 13 10/24/2011   AST 16  10/24/2011   NA 135 10/24/2011   K 3.9 10/24/2011   CL 102 10/24/2011   CREATININE 0.5 10/24/2011   BUN 7 10/24/2011   CO2 27 10/24/2011   TSH 1.23 10/24/2011   INR 1.00 06/26/2010       Assessment & Plan:   CPx/v70.0 - Patient has been counseled on age-appropriate routine health concerns for screening and prevention. These are reviewed and up-to-date. Immunizations are up-to-date or declined. Labs ordered and reviewed.  Also See problem list. Medications and labs reviewed today.

## 2012-12-01 NOTE — Progress Notes (Signed)
Pre-visit discussion using our clinic review tool. No additional management support is needed unless otherwise documented below in the visit note.  

## 2012-12-05 ENCOUNTER — Encounter: Payer: Self-pay | Admitting: Internal Medicine

## 2012-12-08 ENCOUNTER — Other Ambulatory Visit: Payer: Self-pay | Admitting: Dermatology

## 2013-02-11 ENCOUNTER — Telehealth: Payer: Self-pay | Admitting: *Deleted

## 2013-02-11 NOTE — Telephone Encounter (Signed)
Pt phoned, concerned b/c she was billed for 2 separate encounters for her 12/01/12 OV.  Have advised her to contact the billing dept, but she states she already has & was informed that there was something with the way her visits were "coded" and that billing was not responsible for how a visit was coded and advised patient to contact physician's office.  States she was billed for annual physical exam AND a medical visit for the same day. She has followed "chain of command" for assistance in this matter with no success thus far.  She hates to disturb MD, but doesn't know what else to do.  Per her insurance policy, her wellness exam should've been covered 100%.  She is being billed for a $40 copay.  Please advise.  Below are the charges for encounter in question.  CB# [phone] extension 1620 Mobile # [phone]  Code Description Service Date Service Provider Modifiers Qty 90686 PR INFLUENZA VAC 4 VALENT PRSRV FREE 3 YRS PLUS IM 12/01/2012 Rowe Clack, MD 1 60630 PR PREVENTIVE VISIT,EST,40-64 12/01/2012 Rowe Clack, MD 1 16010 PR OFFICE OUTPATIENT VISIT 10 MINUTES 12/01/2012 Rowe Clack, MD 25 [place]lar ADMIN,1 SINGLE/COMB VAC/TOXOID 12/01/2012 Rowe Clack, MD 1

## 2013-02-11 NOTE — Telephone Encounter (Signed)
Don't handle billing inquiries please give to management...Kristin Rasmussen

## 2013-02-11 NOTE — Telephone Encounter (Signed)
Routing this to you per Lucy's recommendation.  Please advise.

## 2013-02-22 NOTE — Telephone Encounter (Signed)
Patient has phoned leaving message today at 67, requesting for this billing issue to be resolved or for someone who can assist her to phone her.

## 2013-03-01 NOTE — Telephone Encounter (Signed)
The patient called the triage line on 02/28/13 and stated she still has billing concerns.  She stated she called the billing office, and they told her there was nothing they could specifically do to change her statement.  They told her she was billed for two appointments, when she stated she only a cpe.  The billing office told the patient that this was a correction her doctor's office would have to make in how the appointments were entered.    The patient's callback - [phone]

## 2013-10-18 ENCOUNTER — Ambulatory Visit (INDEPENDENT_AMBULATORY_CARE_PROVIDER_SITE_OTHER): Payer: BC Managed Care – PPO | Admitting: Sports Medicine

## 2013-10-18 ENCOUNTER — Encounter: Payer: Self-pay | Admitting: Sports Medicine

## 2013-10-18 VITALS — BP 116/76 | HR 79 | Ht 63.0 in | Wt 195.0 lb

## 2013-10-18 DIAGNOSIS — S46092A Other injury of muscle(s) and tendon(s) of the rotator cuff of left shoulder, initial encounter: Secondary | ICD-10-CM

## 2013-10-18 DIAGNOSIS — S46909A Unspecified injury of unspecified muscle, fascia and tendon at shoulder and upper arm level, unspecified arm, initial encounter: Secondary | ICD-10-CM

## 2013-10-18 DIAGNOSIS — S4980XA Other specified injuries of shoulder and upper arm, unspecified arm, initial encounter: Secondary | ICD-10-CM

## 2013-10-18 DIAGNOSIS — M7592 Shoulder lesion, unspecified, left shoulder: Secondary | ICD-10-CM | POA: Insufficient documentation

## 2013-10-18 MED ORDER — NITROGLYCERIN 0.2 MG/HR TD PT24: MEDICATED_PATCH | TRANSDERMAL | Status: DC

## 2013-10-18 MED ORDER — METHYLPREDNISOLONE ACETATE 40 MG/ML IJ SUSP
40.0000 mg | Freq: Once | INTRAMUSCULAR | Status: AC
  Administered 2013-10-18: 40 mg via INTRA_ARTICULAR

## 2013-10-18 NOTE — Assessment & Plan Note (Signed)
Based on patient's exam with a positive empty can with weakness and pain as well as her ultrasound exam which revealed significant calcifications within the supraspinatus great concern for rotator cuff tendinitis particularly in the supraspinatus.  Recommendations: -Subacromial injection, none inflammation and pain. Patient was agreeable with this intervention see procedure note. -Nitroglycerin protocol to help with calcification deposits. Patient given instructions and prescription -Patient given range of motion and strengthening exercises specifically for the supraspinatus -Followup in 4-6 weeks to reassess and we ultrasound -Consideration for barbotaged of calcifications the patient has no clinical improvement from conservative interventions

## 2013-10-18 NOTE — Patient Instructions (Signed)

## 2013-10-18 NOTE — Progress Notes (Signed)
  Kristin Rasmussen - 53 y.o. female MRN [id number]  Date of birth: 08-26-1960  SUBJECTIVE:  Including CC & ROS.  Patient is a pleasant 53 year old Caucasian female who presents today with left shoulder pain. Patient reports that her shoulder pain has been present for about a year. She describes it as a dull aching sensation with occasional sharp shooting pain in certain positions. Denies any history of neck pain or injury. Denies any injury to her shoulder or upper extremity. The pain will bother her at night when she lays on her left side but does not wake her up in the middle of night. Pain is worse with activity such as yardwork or overhead activities. She reports some limitations in range of motion but nothing significantly. Denies any numbness tingling. Over-the-counter medications help relieve some of pain with Advil 800 mg once daily. She's never had any injections or other interventions at this point   ROS: Review of systems otherwise negative except for information present in HPI  HISTORY: Past Medical, Surgical, Social, and Family History Reviewed & Updated per EMR. Pertinent Historical Findings include: Hyperlipidemia on statin therapy which increases her risk for tendinitis  DATA REVIEWED: No previous x-rays or imaging have been done  PHYSICAL EXAM:  VS: BP:116/76 mmHg  HR:79bpm  TEMP: ( )  RESP:   HT:5\' 3"  (160 cm)   WT:195 lb (88.451 kg)  BMI:34.6 PHYSICAL EXAM: SHOULDER EXAM:  General: well nourished Skin of UE: warm; dry, no rashes, lesions, ecchymosis or erythema. Vascular: radial pulses 2+ bilaterally Neurologically: Normal sensation with no sensory or motor defects in C4-C8, bilateral Palpation: no tenderness over the [facility] joint, acromion,  Moderate TTP over bicipital grove and supraspinatus tenderness ROM active/passive: symmetric full 0-180 degree of abduction and forward flexion, symmetric internal (80-90) and external rotation (90) with shoulder at 90 abduction.  Appley's scratch test equal bilaterally Strength testing: 5/5 symmetric strength in internal and external rotation, forward flexion, adduction and abduction at 0 deg     Special Test: Negative Neer's, Positive Hawkins, Positive empty can for weakness and pain, neg O'Brien, some pain with speeds no weakness  MSK [place]: Ultrasound of her shoulder revealed some fluid around the biceps tendon within the groove. Subscapularis is unremarkable for any hypoechoic changes. Supraspinatus has significant calcified deposits throughout the tendon at the insertion in mid substance which could be seen on the mid and posterior portions of the tendon. Infraspinatus and teres minor are unremarkable.  ASSESSMENT & PLAN: See problem based charting & AVS for pt instructions.  Injection procedure: Consent obtained and verified. Sterile betadine prep. Furthur cleansed with alcohol. Topical analgesic spray: Ethyl chloride. Injection Indication: Rotator cuff tendinopathy Approached in typical fashion with: subacromial space Completed without difficulty Meds: 1 cc of Depo-Medrol and 3 cc of lidocaine Needle: 25-gauge 1 and 1/2 inch Aftercare instructions and Red flags advised. Advised to call if fevers/chills, erythema, induration, drainage, or persistent bleeding.

## 2013-11-24 ENCOUNTER — Other Ambulatory Visit: Payer: Self-pay | Admitting: Internal Medicine

## 2013-11-29 ENCOUNTER — Encounter: Payer: Self-pay | Admitting: Sports Medicine

## 2013-11-29 ENCOUNTER — Ambulatory Visit (INDEPENDENT_AMBULATORY_CARE_PROVIDER_SITE_OTHER): Payer: BC Managed Care – PPO | Admitting: Sports Medicine

## 2013-11-29 VITALS — BP 111/73 | HR 84 | Ht 63.0 in | Wt 195.0 lb

## 2013-11-29 DIAGNOSIS — S46092A Other injury of muscle(s) and tendon(s) of the rotator cuff of left shoulder, initial encounter: Secondary | ICD-10-CM

## 2013-11-29 MED ORDER — NITROGLYCERIN 0.2 MG/HR TD PT24: MEDICATED_PATCH | TRANSDERMAL | Status: DC

## 2013-11-29 NOTE — Assessment & Plan Note (Signed)
Subsequent visit for left shoulder rotator cuff tendinopathy with calcific tendinopathy within the supraspinatus  Recommendations: -Patient is clinically improved with less pain and more mobility. -Recommending continue nitroglycerin protocol for another 8 weeks to complete a 12 week course. -Encouraged patient to continue home exercise program with adding additional scapular stabilizing exercises, internal and external rotation with their management, Row, and arm extension patient was given a green therapy and with encouragement to start with low weight high wraps and progress up to about 30-35 reps. -Planning follow-up in 2 months

## 2013-11-29 NOTE — Progress Notes (Signed)
  Kristin Rasmussen - 53 y.o. female MRN [id number]  Date of birth: 03-11-1960  SUBJECTIVE:  Including CC & ROS.  Patient's a 53 year old female presenting today for follow-up left shoulder rotator [place] tendinitis specifically with calcific changes in her supraspinatus. Patient reports she's noticed some significant improvement in her pain. She's no longer having sharp shooting pain into her arm. She denies any significant pain with overhead activities. Able to sleep at night with no aching sensation.  At our last office visit patient was treated with a subacromial injection. She was also given a prescription for nitroglycerin to apply over her supraspinatus and infraspinatus insertion site. She is also provided with home exercises of shoulder wheel such she can preform without pain.    ROS: Review of systems otherwise negative except for information present in HPI  HISTORY: Past Medical, Surgical, Social, and Family History Reviewed & Updated per EMR. Pertinent Historical Findings include: Hyperlipidemia on statin therapy which increases her risk for tendinitis  DATA REVIEWED: No previous x-rays or imaging have been done  PHYSICAL EXAM:  VS: BP:111/73 mmHg  HR:84bpm  TEMP: ( )  RESP:   HT:5\' 3"  (160 cm)   WT:195 lb (88.451 kg)  BMI:34.6 PHYSICAL EXAM: SHOULDER EXAM:  General: well nourished Skin of UE: warm; dry, no rashes, lesions, ecchymosis or erythema. Vascular: radial pulses 2+ bilaterally Neurologically: Normal sensation with no sensory or motor defects in C4-C8, bilateral Palpation: no tenderness over the [facility] joint, acromion,  Moderate TTP over bicipital grove and supraspinatus tenderness ROM active/passive: symmetric full 0-180 degree of abduction and forward flexion, symmetric internal (80-90) and external rotation (90) with shoulder at 90 abduction. Appley's scratch test equal bilaterally Strength testing: 5/5 symmetric strength in internal and external rotation, forward  flexion, adduction and abduction at 0 deg     Special Test: Negative Neer's, Positive Hawkins, Positive empty can for weakness and pain, neg O'Brien, some pain with speeds no weakness  MSK [place]: Ultrasound of her shoulder revealed some fluid around the biceps tendon within the groove still present. Subscapularis is unremarkable no hypoechoic changes. Supraspinatus has smaller calcified deposits throughout the tendon at the insertion in mid substance which shows improvement. Infraspinatus and teres minor are unremarkable.  ASSESSMENT & PLAN: See problem based charting & AVS for pt instructions.  We spent greater than 50% of our 30 minute office visit in counseling education regarding the patient current clinical problem, risks and benefits of treatment options, and recommend plans for treatment or further evaluation

## 2013-12-02 ENCOUNTER — Other Ambulatory Visit (INDEPENDENT_AMBULATORY_CARE_PROVIDER_SITE_OTHER): Payer: BC Managed Care – PPO

## 2013-12-02 ENCOUNTER — Other Ambulatory Visit: Payer: BC Managed Care – PPO

## 2013-12-02 ENCOUNTER — Encounter: Payer: BC Managed Care – PPO | Admitting: Internal Medicine

## 2013-12-02 ENCOUNTER — Encounter: Payer: PRIVATE HEALTH INSURANCE | Admitting: Internal Medicine

## 2013-12-02 ENCOUNTER — Ambulatory Visit (INDEPENDENT_AMBULATORY_CARE_PROVIDER_SITE_OTHER): Payer: BC Managed Care – PPO | Admitting: Family

## 2013-12-02 ENCOUNTER — Encounter: Payer: Self-pay | Admitting: Family

## 2013-12-02 VITALS — BP 128/78 | HR 76 | Temp 97.7°F | Resp 18 | Ht 63.5 in | Wt 197.8 lb

## 2013-12-02 DIAGNOSIS — Z0000 Encounter for general adult medical examination without abnormal findings: Secondary | ICD-10-CM | POA: Diagnosis not present

## 2013-12-02 LAB — LIPID PANEL
CHOL/HDL RATIO: 4
Cholesterol: 197 mg/dL (ref 0–200)
HDL: 47.7 mg/dL (ref >39.00)
LDL CALC: 128 mg/dL — AB (ref 0–99)
NONHDL: 149.3
TRIGLYCERIDES: 105 mg/dL (ref 0.0–149.0)
VLDL: 21 mg/dL (ref 0.0–40.0)

## 2013-12-02 LAB — TSH: TSH: 2.22 u[IU]/mL (ref 0.35–4.50)

## 2013-12-02 LAB — CBC
HCT: 48.5 % — ABNORMAL HIGH (ref 36.0–46.0)
Hemoglobin: 15.9 g/dL — ABNORMAL HIGH (ref 12.0–15.0)
MCHC: 32.9 g/dL (ref 30.0–36.0)
MCV: 88.2 fl (ref 78.0–100.0)
Platelets: 240 10*3/uL (ref 150.0–400.0)
RBC: 5.5 Mil/uL — AB (ref 3.87–5.11)
RDW: 14.2 % (ref 11.5–15.5)
WBC: 7.7 10*3/uL (ref 4.0–10.5)

## 2013-12-02 LAB — BASIC METABOLIC PANEL
BUN: 8 mg/dL (ref 6–23)
CALCIUM: 9.6 mg/dL (ref 8.4–10.5)
CO2: 30 mEq/L (ref 19–32)
CREATININE: 0.6 mg/dL (ref 0.4–1.2)
Chloride: 101 mEq/L (ref 96–112)
GFR: 117.83 mL/min (ref >60.00)
GLUCOSE: 95 mg/dL (ref 70–99)
Potassium: 4.4 mEq/L (ref 3.5–5.1)
Sodium: 138 mEq/L (ref 135–145)

## 2013-12-02 MED ORDER — ESOMEPRAZOLE MAGNESIUM 40 MG PO CPDR: DELAYED_RELEASE_CAPSULE | ORAL | Status: DC

## 2013-12-02 NOTE — Progress Notes (Signed)
Subjective:    Patient ID: Kristin Rasmussen, female    DOB: 1960/10/09, 53 y.o.   MRN: [id number]  No chief complaint on file.   HPI:  Kristin Rasmussen is a 53 y.o. female who presents today for an annual wellness visit.  1) Health Maintenance - Overall good; Denies any new problems or concerns; problems with shoulder being seen by Dr. Oneida Alar.   Diet - Too good; tries to do balanced diet with moderation of fruits, vegetables and protein. Exercise - Tries to exercise 3-4 times per week  Wt Readings from Last 3 Encounters:  11/29/13 195 lb (88.451 kg)  10/18/13 195 lb (88.451 kg)  12/01/12 192 lb 6.4 oz (87.272 kg)   2) Preventative Exams / Immunizations:  Dental -- Up to date  Vision -- Up to date Immunizations -- Tetanus/flu up to date; Colonoscopy -- Up to date Mammogram -- Up to date  Review of Systems  Constitutional: Denies fever, chills, fatigue, or significant weight gain/loss. HENT: Head: Denies headache or neck pain Ears: Denies changes in hearing, ringing in ears, earache, drainage Nose: Denies discharge, stuffiness, itching, nosebleed, sinus pain Throat: Denies sore throat, hoarseness, dry mouth, sores, thrush Eyes: Denies loss/changes in vision, pain, redness, blurry/double vision, flashing  Cardiovascular: Denies chest pain/discomfort, tightness, palpitations, shortness of breath with activity, difficulty lying down, swelling, sudden awakening with shortness of breath Respiratory: Denies shortness of breath, cough, sputum production, wheezing Gastrointestinal: Denies dysphasia, heartburn, change in appetite, nausea, change in bowel habits, rectal bleeding, constipation, diarrhea, yellow skin or eyes Genitourinary: Denies frequency, urgency, burning/pain, blood in urine, incontinence, change in urinary strength. Musculoskeletal: Denies muscle/joint pain, stiffness, back pain, redness or swelling of joints, trauma Skin: Denies rashes, lumps, itching, dryness, color  changes, or hair/nail changes Neurological: Denies dizziness, fainting, seizures, weakness, numbness, tingling, tremor Psychiatric - Denies nervousness, stress, depression or memory loss Endocrine: Denies heat or cold intolerance, sweating, frequent urination, excessive thirst, changes in appetite Hematologic: Denies ease of bruising or bleeding    Objective:    BP 128/78  Pulse 76  Temp(Src) 97.7 F (36.5 C) (Oral)  Resp 18  Ht 5' 3.5" (1.613 m)  Wt 197 lb 12.8 oz (89.721 kg)  BMI 34.48 kg/m2  SpO2 98% Nursing note and vital signs reviewed.  Physical Exam  Constitutional: She is oriented to person, place, and time. She appears well-developed and well-nourished. No distress.  HENT:  Head: Normocephalic.  Right Ear: Hearing, tympanic membrane, external ear and ear canal normal.  Left Ear: Hearing, tympanic membrane, external ear and ear canal normal.  Nose: Nose normal.  Mouth/Throat: Uvula is midline, oropharynx is clear and moist and mucous membranes are normal.  Eyes: Conjunctivae and EOM are normal. Pupils are equal, round, and reactive to light.  Neck: Normal range of motion. Neck supple. No JVD present. No tracheal deviation present. No thyromegaly present.  Cardiovascular: Normal rate, regular rhythm, normal heart sounds and intact distal pulses.   Pulmonary/Chest: Effort normal and breath sounds normal.  Abdominal: Soft. Bowel sounds are normal. She exhibits no distension and no mass. There is no tenderness. There is no rebound and no guarding.  Musculoskeletal: Normal range of motion.  Lymphadenopathy:    She has no cervical adenopathy.  Neurological: She is alert and oriented to person, place, and time. She has normal reflexes.  Skin: Skin is warm and dry.  Psychiatric: She has a normal mood and affect. Her behavior is normal. Judgment and thought content normal.  Assessment & Plan:

## 2013-12-02 NOTE — Patient Instructions (Addendum)
Thank you for choosing [facility].  Summary/Instructions:   Follow up for a physical in a year or sooner if needed.  Please stop by the lab for lab work.

## 2013-12-02 NOTE — Progress Notes (Signed)
Pre visit review using our clinic review tool, if applicable. No additional management support is needed unless otherwise documented below in the visit note. 

## 2013-12-02 NOTE — Assessment & Plan Note (Signed)
1) Anticipatory Guidance: Discussed importance of smoke alarms and changing batteries annually, suntan lotion in the sun, wearing a seatbelt while driving and not texting while driving.   2) Immunizations / Screenings / Labs:  All immunizations and screenings are up to date / Obtained CBC, BMET, Lipid profile and TSH  Overall well exam. Discussed potential of coming off of pravastatin. Depending on lipid panel results, will give 6 month trial off of medicaiton.

## 2013-12-06 ENCOUNTER — Encounter: Payer: Self-pay | Admitting: Family

## 2013-12-07 ENCOUNTER — Encounter: Payer: Self-pay | Admitting: Family

## 2013-12-12 ENCOUNTER — Other Ambulatory Visit: Payer: Self-pay | Admitting: Dermatology

## 2013-12-22 ENCOUNTER — Other Ambulatory Visit: Payer: Self-pay | Admitting: Sports Medicine

## 2014-01-25 ENCOUNTER — Encounter: Payer: Self-pay | Admitting: Sports Medicine

## 2014-01-25 ENCOUNTER — Ambulatory Visit (INDEPENDENT_AMBULATORY_CARE_PROVIDER_SITE_OTHER): Payer: BC Managed Care – PPO | Admitting: Sports Medicine

## 2014-01-25 VITALS — BP 118/62 | Ht 63.0 in | Wt 195.0 lb

## 2014-01-25 DIAGNOSIS — S46092A Other injury of muscle(s) and tendon(s) of the rotator cuff of left shoulder, initial encounter: Secondary | ICD-10-CM

## 2014-01-25 MED ORDER — NITROGLYCERIN 0.2 MG/HR TD PT24: 0.2000 mg | MEDICATED_PATCH | Freq: Every day | TRANSDERMAL | Status: DC

## 2014-01-25 NOTE — Progress Notes (Signed)
Patient ID: Kristin Rasmussen, female   DOB: 05-Apr-1960, 53 y.o.   MRN: [id number]  Patient returns for follow up of Left calcific supraspinatous tendinitis and bicipital tendinitis She is now 3 months into a treatment course with nitroglycerin patches at home exercises She rarely has to use any ibuprofen now but needed it every day at first Her nighttime pain has completely resolved She has no pain with lifting She is doing her home exercises most days The nitroglycerin has caused no side effects  Problem #2 is a chronic problem with low back pain that started after spreading mulch;  She plans to return for me to evaluate this  Physical exam Pleasant female in no acute distress BP 118/62 mmHg  Ht 5\' 3"  (1.6 m)  Wt 195 lb (88.451 kg)  BMI 34.55 kg/m2  Shoulder: LEFT Inspection reveals no abnormalities, atrophy or asymmetry. Palpation is normal with no tenderness over AC joint or bicipital groove. ROM is full in all planes. Rotator cuff strength normal throughout. No signs of impingement with negative Neer and Hawkin's tests, empty can. Speeds and Yergason's tests normal. No labral pathology noted with negative Obrien's, negative clunk and good stability. Normal scapular function observed. No painful arc and no drop arm sign. No apprehension sign  Ultrasound findings Bicipital tendon now has resolved all abnormal fluid and looked intact The supraspinatous tendon shows a large calcific deposit that is about 25% smaller than before. The tendon itself looks intact with no abnormal hypoechoic change. Infraspinatus and subscapularis tendons and teres minor are all normal A.c. Joint is normal

## 2014-01-25 NOTE — Patient Instructions (Signed)
Key things to do  Use NTG for 3 more months  Do easy arm motion to warm up  Easy weight exercises in and out and overhead in about 3 positions  Do some curls  Try to do these at least 3 times a week  Repeat your Ultrasound in 3 months

## 2014-01-25 NOTE — Assessment & Plan Note (Signed)
Patient has responded extremely well to nitroglycerin and home exercises  Today her examination is normal and her symptoms are minimal  I suggested using the nitroglycerin for another 3 months to see if we can make help resolve the calcific deposits  Scan it again in 3 months

## 2014-02-20 ENCOUNTER — Other Ambulatory Visit: Payer: Self-pay | Admitting: Internal Medicine

## 2014-04-25 ENCOUNTER — Ambulatory Visit: Payer: BC Managed Care – PPO | Admitting: Sports Medicine

## 2014-04-27 ENCOUNTER — Ambulatory Visit (INDEPENDENT_AMBULATORY_CARE_PROVIDER_SITE_OTHER): Payer: BLUE CROSS/BLUE SHIELD | Admitting: Sports Medicine

## 2014-04-27 ENCOUNTER — Encounter: Payer: Self-pay | Admitting: Sports Medicine

## 2014-04-27 VITALS — BP 118/78 | Ht 63.0 in | Wt 195.0 lb

## 2014-04-27 DIAGNOSIS — S46092A Other injury of muscle(s) and tendon(s) of the rotator cuff of left shoulder, initial encounter: Secondary | ICD-10-CM

## 2014-04-27 NOTE — Progress Notes (Signed)
Patient ID: Kristin Rasmussen, female   DOB: 11-16-1960, 54 y.o.   MRN: [id number]  The patient has completed 6 months of nitroglycerin protocol for left rotator cuff She improved a great deal by last visit No nighttime pain Past 2 weeks the shoulders hurting some again She relates this to lifting her grandson and putting him in a car seat  She also gets pain if she spends too much time at work leaning forward working on the computer  No weakness  Physical examination No acute distress BP 118/78 mmHg  Ht 5\' 3"  (1.6 m)  Wt 195 lb (88.451 kg)  BMI 34.55 kg/m2  Shoulder: Inspection reveals no abnormalities, atrophy or asymmetry. Palpation is normal with no tenderness over AC joint or bicipital groove. ROM is full in all planes. Rotator cuff strength normal throughout.  negative empty can. Hawkins test continues to call something Speeds and Yergason's tests normal. No labral pathology noted with negative Obrien's, negative clunk and good stability. Normal scapular function observed but she has a forward roll of the shoulders No painful arc and no drop arm sign. No apprehension sign

## 2014-04-27 NOTE — Assessment & Plan Note (Signed)
She still has some signs of impingement but her rotator cuff strength and testing is normal  She can wean off the nitroglycerin  She needs to work on scapular position and her posture with her shoulders particularly when she is at work  I gave her scapular stabilization exercises  And H.and T stretches in lying position  Continue her back exercises  Return in 3 months for me to reevaluate status of her low back and her left shoulder

## 2014-08-01 ENCOUNTER — Encounter: Payer: Self-pay | Admitting: Sports Medicine

## 2014-08-01 ENCOUNTER — Ambulatory Visit (INDEPENDENT_AMBULATORY_CARE_PROVIDER_SITE_OTHER): Payer: BLUE CROSS/BLUE SHIELD | Admitting: Sports Medicine

## 2014-08-01 VITALS — BP 115/48 | HR 86 | Ht 63.0 in | Wt 200.0 lb

## 2014-08-01 DIAGNOSIS — M65812 Other synovitis and tenosynovitis, left shoulder: Secondary | ICD-10-CM

## 2014-08-01 DIAGNOSIS — M7592 Shoulder lesion, unspecified, left shoulder: Secondary | ICD-10-CM

## 2014-08-01 NOTE — Progress Notes (Signed)
Patient ID: Kristin Rasmussen, female   DOB: [birth year], 54 y.o.   MRN: [id number]  F/u of calcific SST of left RC. Pain is much less.  Weaned off NTG. Motion has improved but slt less on left No night pain  Pain is primarily with carrying grandson (30 lbs) Pain also noted with yard work  Hx of LBP treated elsewhere for which she does HEP Would like to have me evaluate this on RTC  Pexam NAD BP 115/48 mmHg  Pulse 86  Ht 5\' 3"  (1.6 m)  Wt 200 lb (90.719 kg)  BMI 35.44 kg/m2  Shoulder:left Inspection reveals no abnormalities, atrophy or asymmetry. Palpation is normal with no tenderness over AC joint or bicipital groove. ROM is full  But she lacks about 5 deg of full elevation when shoulder  ER 90 deg Rotator cuff strength normal throughout. No signs of impingement with negative  Hawkin's tests, strong but slt pain on empty can. Speeds and Yergason's tests normal. No labral pathology noted with negative Obrien's, negative clunk and good stability. Normal scapular function observed. No painful arc and no drop arm sign. No apprehension sign  [place] BT is normal long and short Suprapinatus tendon shows a large calcific deposit in distal tendon - this is unchanged x that surrounding tendon looks OK IS/Subscap/ TM all normal

## 2014-08-01 NOTE — Assessment & Plan Note (Signed)
The large calcific deposit remains Her sxs did get better with NTG and HEP  I offered her the option of tendon barbotage if this becomes too painful or starts limiting her ADL more  Reck 4 mos

## 2014-08-23 ENCOUNTER — Other Ambulatory Visit: Payer: Self-pay | Admitting: Internal Medicine

## 2014-09-19 ENCOUNTER — Ambulatory Visit (INDEPENDENT_AMBULATORY_CARE_PROVIDER_SITE_OTHER): Payer: BLUE CROSS/BLUE SHIELD | Admitting: Sports Medicine

## 2014-09-19 ENCOUNTER — Encounter: Payer: Self-pay | Admitting: Sports Medicine

## 2014-09-19 VITALS — BP 114/63 | Ht 63.5 in | Wt 200.0 lb

## 2014-09-19 DIAGNOSIS — M545 Low back pain, unspecified: Secondary | ICD-10-CM

## 2014-09-19 DIAGNOSIS — G8929 Other chronic pain: Secondary | ICD-10-CM | POA: Insufficient documentation

## 2014-09-19 MED ORDER — AMITRIPTYLINE HCL 25 MG PO TABS: 25.0000 mg | ORAL_TABLET | Freq: Every day | ORAL | Status: DC

## 2014-09-19 NOTE — Assessment & Plan Note (Signed)
I think she will improve with changes in lifting  Restart PT and work on getting on regular HEP  Amitriptyline at HS

## 2014-09-19 NOTE — Progress Notes (Signed)
Patient ID: Kristin Rasmussen, female   DOB: 24-Jan-1961, 54 y.o.   MRN: [id number]  May 2010 LBP after lifting dirt bags Saw dr Linna Darner and sent to PT Exercise plus using tens at PT helped  2012 another flare of LBP  Pain flares up when holding grandson She thinks this has gradually worsened for past 2 years while grandson learning to walk She carried him a lot Gets pain with yard work/ lifting  Exam Pleasant and NAD BP 114/63 mmHg  Ht 5' 3.5" (1.613 m)  Wt 200 lb (90.719 kg)  BMI 34.87 kg/m2  Pain is central low back towards right SI joint Some pain on 90 deg flexion, only 20 deg extension before pain Right side flexion is painful, not on left Side twisting doesn't have pain Walking on toes and heels without pain  Straight Leg raises with right has pain  Corky Sox test ok bilaterally  Pain with FABER and other testing localizes mostly to RT SIJ Right SI joint doesn't move with pelvic rock  Assess:  LBP probably SIJ  Setup with PT for right SI joint pain And start on amitriptyline 25mg  QHS

## 2014-09-29 ENCOUNTER — Encounter: Payer: Self-pay | Admitting: *Deleted

## 2014-10-05 ENCOUNTER — Other Ambulatory Visit: Payer: Self-pay | Admitting: *Deleted

## 2014-10-05 MED ORDER — CYCLOBENZAPRINE HCL 5 MG PO TABS: 5.0000 mg | ORAL_TABLET | Freq: Every day | ORAL | Status: DC

## 2014-10-10 ENCOUNTER — Ambulatory Visit: Payer: BLUE CROSS/BLUE SHIELD | Admitting: Physical Therapy

## 2014-10-16 ENCOUNTER — Other Ambulatory Visit: Payer: Self-pay | Admitting: Obstetrics & Gynecology

## 2014-10-26 ENCOUNTER — Encounter: Payer: Self-pay | Admitting: *Deleted

## 2014-10-31 ENCOUNTER — Encounter: Payer: Self-pay | Admitting: Sports Medicine

## 2014-12-04 ENCOUNTER — Encounter: Payer: BC Managed Care – PPO | Admitting: Internal Medicine

## 2014-12-04 ENCOUNTER — Ambulatory Visit (INDEPENDENT_AMBULATORY_CARE_PROVIDER_SITE_OTHER): Payer: BLUE CROSS/BLUE SHIELD | Admitting: Internal Medicine

## 2014-12-04 ENCOUNTER — Other Ambulatory Visit (INDEPENDENT_AMBULATORY_CARE_PROVIDER_SITE_OTHER): Payer: BLUE CROSS/BLUE SHIELD

## 2014-12-04 ENCOUNTER — Encounter: Payer: Self-pay | Admitting: Internal Medicine

## 2014-12-04 VITALS — BP 114/80 | HR 85 | Temp 98.1°F | Resp 16 | Wt 199.0 lb

## 2014-12-04 DIAGNOSIS — Z0000 Encounter for general adult medical examination without abnormal findings: Secondary | ICD-10-CM

## 2014-12-04 DIAGNOSIS — K219 Gastro-esophageal reflux disease without esophagitis: Secondary | ICD-10-CM

## 2014-12-04 DIAGNOSIS — E782 Mixed hyperlipidemia: Secondary | ICD-10-CM

## 2014-12-04 LAB — COMPREHENSIVE METABOLIC PANEL
ALT: 13 U/L (ref 0–35)
AST: 15 U/L (ref 0–37)
Albumin: 4 g/dL (ref 3.5–5.2)
Alkaline Phosphatase: 47 U/L (ref 39–117)
BUN: 7 mg/dL (ref 6–23)
CALCIUM: 9.3 mg/dL (ref 8.4–10.5)
CHLORIDE: 102 meq/L (ref 96–112)
CO2: 28 meq/L (ref 19–32)
CREATININE: 0.54 mg/dL (ref 0.40–1.20)
GFR: 124.94 mL/min (ref >60.00)
Glucose, Bld: 98 mg/dL (ref 70–99)
POTASSIUM: 4.2 meq/L (ref 3.5–5.1)
SODIUM: 140 meq/L (ref 135–145)
Total Bilirubin: 0.5 mg/dL (ref 0.2–1.2)
Total Protein: 7 g/dL (ref 6.0–8.3)

## 2014-12-04 LAB — VITAMIN B12: VITAMIN B 12: 547 pg/mL (ref 211–911)

## 2014-12-04 LAB — CBC WITH DIFFERENTIAL/PLATELET
BASOS ABS: 0.1 10*3/uL (ref 0.0–0.1)
Basophils Relative: 1 % (ref 0.0–3.0)
Eosinophils Absolute: 0.2 10*3/uL (ref 0.0–0.7)
Eosinophils Relative: 2.5 % (ref 0.0–5.0)
HCT: 46.5 % — ABNORMAL HIGH (ref 36.0–46.0)
Hemoglobin: 15.6 g/dL — ABNORMAL HIGH (ref 12.0–15.0)
LYMPHS ABS: 1.6 10*3/uL (ref 0.7–4.0)
Lymphocytes Relative: 22.3 % (ref 12.0–46.0)
MCHC: 33.5 g/dL (ref 30.0–36.0)
MCV: 86.2 fl (ref 78.0–100.0)
Monocytes Absolute: 0.4 10*3/uL (ref 0.1–1.0)
Monocytes Relative: 5.4 % (ref 3.0–12.0)
NEUTROS PCT: 68.8 % (ref 43.0–77.0)
Neutro Abs: 5 10*3/uL (ref 1.4–7.7)
Platelets: 203 10*3/uL (ref 150.0–400.0)
RBC: 5.39 Mil/uL — AB (ref 3.87–5.11)
RDW: 13.9 % (ref 11.5–15.5)
WBC: 7.3 10*3/uL (ref 4.0–10.5)

## 2014-12-04 LAB — TSH: TSH: 1.97 u[IU]/mL (ref 0.35–4.50)

## 2014-12-04 LAB — LIPID PANEL
CHOL/HDL RATIO: 4
Cholesterol: 187 mg/dL (ref 0–200)
HDL: 46.3 mg/dL (ref >39.00)
LDL CALC: 102 mg/dL — AB (ref 0–99)
NonHDL: 141.04
TRIGLYCERIDES: 197 mg/dL — AB (ref 0.0–149.0)
VLDL: 39.4 mg/dL (ref 0.0–40.0)

## 2014-12-04 LAB — MAGNESIUM: Magnesium: 2 mg/dL (ref 1.5–2.5)

## 2014-12-04 LAB — HEPATITIS C ANTIBODY: HCV AB: NEGATIVE

## 2014-12-04 MED ORDER — ESOMEPRAZOLE MAGNESIUM 40 MG PO CPDR: DELAYED_RELEASE_CAPSULE | ORAL | Status: DC

## 2014-12-04 MED ORDER — PRAVASTATIN SODIUM 20 MG PO TABS: 20.0000 mg | ORAL_TABLET | Freq: Every day | ORAL | Status: DC

## 2014-12-04 NOTE — Assessment & Plan Note (Signed)
On pravastatin - will check lipid panel, cmp Prescription sent to pharmacy Work on increasing exercise, working on weight loss Follow up annually

## 2014-12-04 NOTE — Assessment & Plan Note (Signed)
Symptoms controlled with nexium - has tried several other medications and they were not effective Continue nexium daily Working on weight loss Discussed with long term use of the nexium - at this point benefits outweigh risks Prescription sent to pharmacy

## 2014-12-04 NOTE — Progress Notes (Signed)
Subjective:    Patient ID: Kristin Rasmussen, female    DOB: 1960-05-09, 54 y.o.   MRN: [id number]  HPI She is here for a physical exam.   She has no concerns or questions.  She was placed on a appetite suppressant last month by her gyn and she thinks it is helping.   Medications and allergies reviewed with patient and updated if appropriate.  Patient Active Problem List   Diagnosis Date Noted  . Low back pain syndrome 09/19/2014  . Routine general medical examination at a health care facility 12/02/2013  . Left supraspinatus tendonitis 10/18/2013  . Anxiety   . Heel pain 08/13/2011  . Plantar fasciitis 08/13/2011  . Metatarsalgia 08/13/2011  . DIVERTICULOSIS, COLON 01/20/2008  . SKIN CANCER, HX OF 01/20/2008  . MELANOMA, MALIGNANT, SKIN NOS 10/28/2006  . HYPERLIPIDEMIA 10/28/2006  . GERD 10/28/2006    Past Medical History  Diagnosis Date  . Thrombocytopenia 2002    133,000 platelets  . GERD (gastroesophageal reflux disease)   . Diverticulosis   . Basal cell cancer     x3; Dr Tonia Brooms  . Melanoma 2006    x1  . Hyperlipidemia   . IBS (irritable bowel syndrome)   . Spasm of esophagus   . Anemia 2004    HCT 34.7  . Chest pain 06/2010    Costochondritis  . Plantar fasciitis 08/13/2011    Left is significant and RT is mild by [place] criteria   . Anxiety     follows with psyc for same    Past Surgical History  Procedure Laterality Date  . Melanoma excision      LUE 2006  [facility]; Derm : Dr Crista Luria  . Cholecystectomy  2002    [facility]  . Tonsillectomy    . Colonoscopy    . Total abdominal hysterectomy  1991    abnormal PAP hx    Social History   Social History  . Marital Status: Married    Spouse Name: N/A  . Number of Children: 3  . Years of Education: 12   Occupational History  . Psychiatrist   .     Social History Main Topics  . Smoking status: Never Smoker   . Smokeless tobacco: Never Used  . Alcohol Use: Yes     Comment: rarely,only holiday's  .  Drug Use: No  . Sexual Activity: Not on file   Other Topics Concern  . Not on file   Social History Narrative    Review of Systems  Constitutional: Negative for fever, chills, appetite change, fatigue and unexpected weight change.  HENT: Negative for hearing loss.   Eyes: Negative for visual disturbance.  Respiratory: Negative for cough, shortness of breath and wheezing.   Cardiovascular: Negative.   Gastrointestinal: Negative for nausea, abdominal pain, diarrhea, constipation and blood in stool.  Endocrine: Negative for polydipsia and polyphagia.  Genitourinary: Negative for dysuria and hematuria.  Neurological: Negative for dizziness, light-headedness and headaches.  Psychiatric/Behavioral: Negative for dysphoric mood. The patient is not nervous/anxious.        Objective:   Filed Vitals:   12/04/14 0904  BP: 114/80  Pulse: 85  Temp: 98.1 F (36.7 C)  Resp: 16   Filed Weights   12/04/14 0904  Weight: 199 lb (90.266 kg)   Body mass index is 34.69 kg/(m^2).   Physical Exam  Constitutional: She is oriented to person, place, and time. She appears well-developed and well-nourished. No distress.  HENT:  Head: Normocephalic and atraumatic.  Right Ear: External ear normal.  Left Ear: External ear normal.  Mouth/Throat: Oropharynx is clear and moist.  Eyes: Conjunctivae and EOM are normal.  Neck: Neck supple. No tracheal deviation present. No thyromegaly present.  No carotid bruit  Cardiovascular: Normal rate, regular rhythm, normal heart sounds and intact distal pulses.   No murmur heard. Pulmonary/Chest: Effort normal and breath sounds normal. No respiratory distress. She has no wheezes.  Abdominal: Soft. Bowel sounds are normal. She exhibits no distension. There is no tenderness.  Musculoskeletal: She exhibits no edema.  Lymphadenopathy:    She has no cervical adenopathy.  Neurological: She is alert and oriented to person, place, and time.  Psychiatric: She has a  normal mood and affect. Her behavior is normal.          Assessment & Plan:    Physical exam: Screening blood work ordered, consented to hiv and hep c Gyn/pap and mammo up to date Colonoscopy up todate dexa not done yet - will order today Immunizations up to date, had a flu shot at work ekg not indicated Sees dermatology annually Doing some exercise - advised her to increase it Working on weight loss - encouraged her to continue her efforts  See Problem List.

## 2014-12-04 NOTE — Patient Instructions (Addendum)
  We have reviewed your prior records including labs and tests today.  Test(s) ordered today. Your results will be released to [place] (or called to you) after review, usually within 72hours after test completion. If any changes need to be made, you will be notified at that same time.  All other Health Maintenance issues reviewed.   All recommended immunizations and age-appropriate screenings are up-to-date.  No immunizations administered today.   Medications reviewed and updated.   No changes recommended at this time.  Your prescription(s) have been submitted to your pharmacy. Please take as directed and contact our office if you believe you are having problem(s) with the medication(s).   Please followup in one year, sooner if needed.

## 2014-12-04 NOTE — Progress Notes (Signed)
Pre visit review using our clinic review tool, if applicable. No additional management support is needed unless otherwise documented below in the visit note. 

## 2014-12-05 ENCOUNTER — Encounter: Payer: Self-pay | Admitting: Internal Medicine

## 2014-12-27 ENCOUNTER — Encounter: Payer: BLUE CROSS/BLUE SHIELD | Admitting: Internal Medicine

## 2014-12-29 ENCOUNTER — Emergency Department
Admission: EM | Admit: 2014-12-29 | Discharge: 2014-12-29 | Disposition: A | Discharge status: Home / Self Care | Payer: BLUE CROSS/BLUE SHIELD | Source: Home / Self Care | Attending: Family Medicine | Admitting: Family Medicine

## 2014-12-29 ENCOUNTER — Encounter: Payer: Self-pay | Admitting: Emergency Medicine

## 2014-12-29 DIAGNOSIS — J209 Acute bronchitis, unspecified: Secondary | ICD-10-CM | POA: Diagnosis not present

## 2014-12-29 MED ORDER — BENZONATATE 200 MG PO CAPS: 200.0000 mg | ORAL_CAPSULE | Freq: Every day | ORAL | Status: DC

## 2014-12-29 MED ORDER — DOXYCYCLINE HYCLATE 100 MG PO CAPS: 100.0000 mg | ORAL_CAPSULE | Freq: Two times a day (BID) | ORAL | Status: DC

## 2014-12-29 NOTE — ED Provider Notes (Signed)
CSN: [id number]     Arrival date & time 12/29/14  [id number] History   First MD Initiated Contact with Patient 12/29/14 [phone]     Chief Complaint  Patient presents with  . Cough     HPI Comments: Patient noticed mild sinus congestion about 4 to 5 days ago.  Three days ago she developed a non-productive cough that has become worse, especially at night.  She often coughs until she gags.  Today she has had mild sore throat when coughing.  No fevers, chills, and sweats.  No pleuritic pain but feels tightness in her anterior chest. Her last Tdap was in 2011.  The history is provided by the patient and the spouse.    Past Medical History  Diagnosis Date  . Thrombocytopenia ([place]) 2002    133,000 platelets  . GERD (gastroesophageal reflux disease)   . Diverticulosis   . Basal cell cancer     x3; Dr Tonia Brooms  . Melanoma ([place]) 2006    x1  . Hyperlipidemia   . IBS (irritable bowel syndrome)   . Spasm of esophagus   . Anemia 2004    HCT 34.7  . Chest pain 06/2010    Costochondritis  . Plantar fasciitis 08/13/2011    Left is significant and RT is mild by [place] criteria   . Anxiety     follows with psyc for same   Past Surgical History  Procedure Laterality Date  . Melanoma excision      LUE 2006  [facility]; Derm : Dr Crista Luria  . Cholecystectomy  2002    [facility]  . Tonsillectomy    . Colonoscopy    . Total abdominal hysterectomy  1991    abnormal PAP hx   Family History  Problem Relation Age of Onset  . Hypertension Father   . Colon polyps Father   . GER disease Father   . Skin cancer Father   . Ovarian cancer Paternal Aunt   . Diabetes Mother   . Hypertension Mother   . Heart attack Maternal Grandmother 76  . Diabetes Maternal Grandmother   . Heart attack Maternal Grandfather 69  . Stroke Maternal Grandfather 60  . Colon cancer Paternal Grandfather     unsure age   Social History  Substance Use Topics  . Smoking status: Never Smoker   . Smokeless tobacco: Never Used  . Alcohol Use:  Yes     Comment: rarely,only holiday's   OB History    No data available     Review of Systems + sore throat + cough No pleuritic pain ? wheezing + nasal congestion + post-nasal drainage + sinus pain/pressure No itchy/red eyes No earache No hemoptysis No SOB No fever/chills No nausea No vomiting No abdominal pain No diarrhea No urinary symptoms No skin rash + fatigue No myalgias + headache Used OTC meds without relief  Allergies  Review of patient's allergies indicates no known allergies.  Home Medications   Prior to Admission medications   Medication Sig Start Date End Date Taking? Authorizing Provider  ALPRAZolam (XANAX) 0.25 MG tablet Take 12.5 mg by mouth 2 (two) times daily.      Historical Provider, MD  benzonatate (TESSALON) 200 MG capsule Take 1 capsule (200 mg total) by mouth at bedtime. Take as needed for cough 12/29/14   Kandra Nicolas, MD  Calcium Carbonate-Vitamin D (CALCIUM + D) 600-200 MG-UNIT TABS Take 1 tablet by mouth daily.      Historical Provider, MD  doxycycline (  VIBRAMYCIN) 100 MG capsule Take 1 capsule (100 mg total) by mouth 2 (two) times daily. Take with food. 12/29/14   Kandra Nicolas, MD  escitalopram (LEXAPRO) 20 MG tablet Take 20 mg by mouth daily.    Historical Provider, MD  esomeprazole (NEXIUM) 40 MG capsule TAKE ONE CAPSULE BY MOUTH ONE TIME DAILY 12/04/14   Binnie Rail, MD  estradiol (VIVELLE-DOT) 0.05 MG/24HR patch Place 1 patch onto the skin 2 (two) times a week.    Historical Provider, MD  fish oil-omega-3 fatty acids 1000 MG capsule Take 1 g by mouth daily.     Historical Provider, MD  Multiple Vitamin (MULTIVITAMIN) tablet Take 1 tablet by mouth daily.      Historical Provider, MD  oxybutynin (DITROPAN XL) 15 MG 24 hr tablet  03/26/14   Historical Provider, MD  pravastatin (PRAVACHOL) 20 MG tablet Take 1 tablet (20 mg total) by mouth daily. 12/04/14   Binnie Rail, MD   Meds Ordered and Administered this Visit  Medications  - No data to display  BP 133/85 mmHg  Pulse 87  Temp(Src) 97.8 F (36.6 C) (Oral)  Ht 5\' 3"  (1.6 m)  Wt 197 lb (89.359 kg)  BMI 34.91 kg/m2  SpO2 98% No data found.   Physical Exam Nursing notes and Vital Signs reviewed. Appearance:  Patient appears stated age, and in no acute distress.  Patient is obese (BMI 34.9) Eyes:  Pupils are equal, round, and reactive to light and accomodation.  Extraocular movement is intact.  Conjunctivae are not inflamed  Ears:  Canals normal.  Tympanic membranes normal.  Nose:  Mildly congested turbinates.  No sinus tenderness.   Pharynx:  Normal Neck:  Supple.  Tender enlarged posterior nodes are palpated bilaterally  Lungs:  Clear to auscultation.  Breath sounds are equal.  Moving air well. Chest:  Distinct tenderness to palpation over the mid-sternum.  Heart:  Regular rate and rhythm without murmurs, rubs, or gallops.  Abdomen:  Nontender without masses or hepatosplenomegaly.  Bowel sounds are present.  No CVA or flank tenderness.  Extremities:  No edema.  No calf tenderness Skin:  No rash present.   ED Course  Procedures none   MDM   1. Acute bronchitis, unspecified organism; ?pertussis   Begin doxycycline 100mg  BID for atypical coverage.  Prescription written for Benzonatate [facility]) to take at bedtime for night-time cough.  Take plain guaifenesin (1200mg  extended release tabs such as Mucinex) twice daily, with plenty of water, for cough and congestion.  May add Pseudoephedrine (30mg , one or two every 4 to 6 hours) for sinus congestion.  Get adequate rest.   May use Afrin nasal spray (or generic oxymetazoline) twice daily for about 5 days and then discontinue.  Also recommend using saline nasal spray several times daily and saline nasal irrigation (AYR is a common brand).   Try warm salt water gargles for sore throat.  Stop all antihistamines for now, and other non-prescription cough/cold preparations. May take Ibuprofen 200mg , 4 tabs every  8 hours with food for chest/sternum discomfort.   Follow-up with family doctor if not improving about 7 to10 days.    Kandra Nicolas, MD 12/29/14 1021

## 2014-12-29 NOTE — Discharge Instructions (Signed)
Take plain guaifenesin (1200mg  extended release tabs such as Mucinex) twice daily, with plenty of water, for cough and congestion.  May add Pseudoephedrine (30mg , one or two every 4 to 6 hours) for sinus congestion.  Get adequate rest.   May use Afrin nasal spray (or generic oxymetazoline) twice daily for about 5 days and then discontinue.  Also recommend using saline nasal spray several times daily and saline nasal irrigation (AYR is a common brand).   Try warm salt water gargles for sore throat.  Stop all antihistamines for now, and other non-prescription cough/cold preparations. May take Ibuprofen 200mg , 4 tabs every 8 hours with food for chest/sternum discomfort.   Follow-up with family doctor if not improving about 7 to10 days.

## 2014-12-29 NOTE — ED Notes (Signed)
Pt c/o productive cough, chest soreness, congestion, afebrile, sinus pressure.

## 2014-12-30 ENCOUNTER — Telehealth: Payer: Self-pay | Admitting: Emergency Medicine

## 2014-12-31 NOTE — Telephone Encounter (Signed)
-----   Message from Kandra Nicolas, MD sent at 12/31/2014 11:49 AM EST ----- Contact: [phone] Patient should remain out of work until she has completed five full days of antibiotic. ----- Message -----    From: Britta Mccreedy, [place]: 12/30/2014   2:25 PM      To: Kandra Nicolas, MD  Pt called and left message about being diagnosed with possible "whooping cough" when she was seen a couple of days ago.  Pt's paper work states acute bronchitis and was started on antbs.  Pt has taken 3 days of meds.  Is pt considered to be contagious after being on 3 days of antbs and can she return to work on Monday?  Please advise.  [place]

## 2015-01-01 ENCOUNTER — Telehealth: Payer: Self-pay | Admitting: *Deleted

## 2015-04-16 ENCOUNTER — Ambulatory Visit (INDEPENDENT_AMBULATORY_CARE_PROVIDER_SITE_OTHER)
Admission: RE | Admit: 2015-04-16 | Discharge: 2015-04-16 | Disposition: A | Discharge status: Home / Self Care | Payer: BLUE CROSS/BLUE SHIELD | Source: Ambulatory Visit | Attending: Internal Medicine | Admitting: Internal Medicine

## 2015-04-16 DIAGNOSIS — Z0000 Encounter for general adult medical examination without abnormal findings: Secondary | ICD-10-CM

## 2015-04-16 DIAGNOSIS — Z13820 Encounter for screening for osteoporosis: Secondary | ICD-10-CM

## 2015-04-19 ENCOUNTER — Encounter: Payer: Self-pay | Admitting: Internal Medicine

## 2015-04-19 DIAGNOSIS — M8580 Other specified disorders of bone density and structure, unspecified site: Secondary | ICD-10-CM | POA: Insufficient documentation

## 2015-04-19 DIAGNOSIS — Z13820 Encounter for screening for osteoporosis: Secondary | ICD-10-CM | POA: Diagnosis not present

## 2015-08-19 ENCOUNTER — Other Ambulatory Visit: Payer: Self-pay | Admitting: Internal Medicine

## 2015-11-05 ENCOUNTER — Encounter: Payer: Self-pay | Admitting: Student

## 2015-11-26 ENCOUNTER — Other Ambulatory Visit: Payer: Self-pay | Admitting: *Deleted

## 2015-11-26 MED ORDER — PRAVASTATIN SODIUM 20 MG PO TABS: 20.0000 mg | ORAL_TABLET | Freq: Every day | ORAL | 0 refills | Status: DC

## 2015-12-03 NOTE — Progress Notes (Signed)
Subjective:    Patient ID: Kristin Rasmussen, female    DOB: 1960-04-12, 55 y.o.   MRN: [id number]  HPI She is here for a physical exam.   She denies changes in her history.   GERD:  She is taking her medication daily.  She will have symptoms if she does not take the medication for a day or two.  She tries to follow a GERD diet.    Hyperlipidemia: She is taking her medication daily. She is compliant with a low fat/cholesterol diet. She is exercising irregularly.    Medications and allergies reviewed with patient and updated if appropriate.  Patient Active Problem List   Diagnosis Date Noted  . Osteopenia 04/19/2015  . Low back pain syndrome 09/19/2014  . Left supraspinatus tendonitis 10/18/2013  . Anxiety   . Metatarsalgia 08/13/2011  . DIVERTICULOSIS, COLON 01/20/2008  . SKIN CANCER, HX OF 01/20/2008  . Melanoma of skin ([place]) 10/28/2006  . HYPERLIPIDEMIA 10/28/2006  . GERD 10/28/2006    Current Outpatient Prescriptions on File Prior to Visit  Medication Sig Dispense Refill  . ALPRAZolam (XANAX) 0.25 MG tablet Take 12.5 mg by mouth 2 (two) times daily.      . Calcium Carbonate-Vitamin D (CALCIUM + D) 600-200 MG-UNIT TABS Take 1 tablet by mouth daily.      [name] escitalopram (LEXAPRO) 20 MG tablet Take 20 mg by mouth daily.    [name] esomeprazole (NEXIUM) 40 MG capsule TAKE ONE CAPSULE BY MOUTH ONE TIME DAILY 90 capsule 3  . fish oil-omega-3 fatty acids 1000 MG capsule Take 1,200 mg by mouth daily.     . Multiple Vitamin (MULTIVITAMIN) tablet Take 1 tablet by mouth daily.      . pravastatin (PRAVACHOL) 20 MG tablet Take 1 tablet (20 mg total) by mouth daily. Yearly physical w/llabs is due must see MD for future refills 30 tablet 0   No current facility-administered medications on file prior to visit.     Past Medical History:  Diagnosis Date  . Anemia 2004   HCT 34.7  . Anxiety    follows with psyc for same  . Basal cell cancer    x3; Dr Tonia Brooms  . Chest pain 06/2010   Costochondritis  . Diverticulosis   . GERD (gastroesophageal reflux disease)   . Hyperlipidemia   . IBS (irritable bowel syndrome)   . Melanoma ([place]) 2006   x1  . Plantar fasciitis 08/13/2011   Left is significant and RT is mild by [place] criteria   . Spasm of esophagus   . Thrombocytopenia ([place]) 2002   133,000 platelets    Past Surgical History:  Procedure Laterality Date  . CHOLECYSTECTOMY  2002   [facility]  . COLONOSCOPY    . MELANOMA EXCISION     LUE 2006  [facility]; Derm : Dr Crista Luria  . TONSILLECTOMY    . TOTAL ABDOMINAL HYSTERECTOMY  1991   abnormal PAP hx    Social History   Social History  . Marital status: Married    Spouse name: N/A  . Number of children: 3  . Years of education: 12   Occupational History  . Psychiatrist   .  [employer]   Social History Main Topics  . Smoking status: Never Smoker  . Smokeless tobacco: Never Used  . Alcohol use Yes     Comment: rarely, only holiday's  . Drug use: No  . Sexual activity: Not Asked   Other Topics Concern  .  None   Social History Narrative   Does some yard work and walking for exercise - couple of days a week    Family History  Problem Relation Age of Onset  . Hypertension Father   . Colon polyps Father   . GER disease Father   . Skin cancer Father   . Ovarian cancer Paternal Aunt   . Diabetes Mother   . Hypertension Mother   . Heart attack Maternal Grandmother 76  . Diabetes Maternal Grandmother   . Heart attack Maternal Grandfather 69  . Stroke Maternal Grandfather 49  . Colon cancer Paternal Grandfather     unsure age    Review of Systems  Constitutional: Negative for appetite change, chills, fatigue, fever and unexpected weight change.  Eyes: Negative for visual disturbance.  Respiratory: Negative for cough, shortness of breath and wheezing.   Cardiovascular: Positive for leg swelling (mild with sitting all day). Negative for chest pain and palpitations.  Gastrointestinal:  Negative for abdominal pain, blood in stool, constipation, diarrhea and nausea.  Genitourinary: Negative for dysuria and hematuria.  Musculoskeletal: Positive for back pain (chronic and intermittent). Negative for arthralgias.  Skin: Negative for color change.  Neurological: Negative for dizziness, light-headedness and headaches.  Psychiatric/Behavioral: Positive for decreased concentration. The patient is nervous/anxious.        Objective:   Vitals:   12/04/15 0834  BP: 128/82  Pulse: 79  Resp: 16  Temp: 97.9 F (36.6 C)   Filed Weights   12/04/15 0834  Weight: 199 lb (90.3 kg)   Body mass index is 35.25 kg/m.   Physical Exam Constitutional: She appears well-developed and well-nourished. No distress.  HENT:  Head: Normocephalic and atraumatic.  Right Ear: External ear normal. Normal ear canal and TM Left Ear: External ear normal.  Normal ear canal and TM Mouth/Throat: Oropharynx is clear and moist.  Eyes: Conjunctivae and EOM are normal.  Neck: Neck supple. No tracheal deviation present. No thyromegaly present.  No carotid bruit  Cardiovascular: Normal rate, regular rhythm and normal heart sounds.   No murmur heard.  No edema. Pulmonary/Chest: Effort normal and breath sounds normal. No respiratory distress. She has no wheezes. She has no rales.  Breast: deferred to Gyn Abdominal: Soft. She exhibits no distension. There is no tenderness.  Lymphadenopathy: She has no cervical adenopathy.  Skin: Skin is warm and dry. She is not diaphoretic.  Psychiatric: She has a normal mood and affect. Her behavior is normal.         Assessment & Plan:   Physical exam: Screening blood work ordered Immunizations Up to date  Colonoscopy Up to date     Mammogram  Up to date  - done by gyn Gyn  Up to date  Dexa  Up to date   Eye exams  Up to date  EKG - last done 2013 Exercise - stressed regular exercise Weight - advised weight loss Skin - none, sees derm annually Substance  abuse - none  See Problem List for Assessment and Plan of chronic medical problems.   F/u annually

## 2015-12-03 NOTE — Patient Instructions (Addendum)
Take calcium citrate not calcium carbonate.  Your BMI is 35.25, which is in the obese category.  Being overweight or obese can increase your risk of cancer, diabetes, heart disease, liver disease, kidney disease among other things.  Work on weight loss.   Test(s) ordered today. Your results will be released to [place] (or called to you) after review, usually within 72hours after test completion. If any changes need to be made, you will be notified at that same time.  All other Health Maintenance issues reviewed.   All recommended immunizations and age-appropriate screenings are up-to-date or discussed.  No immunizations administered today.   Medications reviewed and updated.  No changes recommended at this time.  Your prescription(s) have been submitted to your pharmacy. Please take as directed and contact our office if you believe you are having problem(s) with the medication(s).   Please followup in one year for a physical   Health Maintenance, Female Adopting a healthy lifestyle and getting preventive care can go a long way to promote health and wellness. Talk with your health care provider about what schedule of regular examinations is right for you. This is a good chance for you to check in with your provider about disease prevention and staying healthy. In between checkups, there are plenty of things you can do on your own. Experts have done a lot of research about which lifestyle changes and preventive measures are most likely to keep you healthy. Ask your health care provider for more information. WEIGHT AND DIET  Eat a healthy diet  Be sure to include plenty of vegetables, fruits, low-fat dairy products, and lean protein.  Do not eat a lot of foods high in solid fats, added sugars, or salt.  Get regular exercise. This is one of the most important things you can do for your health.  Most adults should exercise for at least 150 minutes each week. The exercise should increase your  heart rate and make you sweat (moderate-intensity exercise).  Most adults should also do strengthening exercises at least twice a week. This is in addition to the moderate-intensity exercise.  Maintain a healthy weight  Body mass index (BMI) is a measurement that can be used to identify possible weight problems. It estimates body fat based on height and weight. Your health care provider can help determine your BMI and help you achieve or maintain a healthy weight.  For females 58 years of age and older:   A BMI below 18.5 is considered underweight.  A BMI of 18.5 to 24.9 is normal.  A BMI of 25 to 29.9 is considered overweight.  A BMI of 30 and above is considered obese.  Watch levels of cholesterol and blood lipids  You should start having your blood tested for lipids and cholesterol at 55 years of age, then have this test every 5 years.  You may need to have your cholesterol levels checked more often if:  Your lipid or cholesterol levels are high.  You are older than 55 years of age.  You are at high risk for heart disease.  CANCER SCREENING   Lung Cancer  Lung cancer screening is recommended for adults 27-37 years old who are at high risk for lung cancer because of a history of smoking.  A yearly low-dose CT scan of the lungs is recommended for people who:  Currently smoke.  Have quit within the past 15 years.  Have at least a 30-pack-year history of smoking. A pack year is smoking an  average of one pack of cigarettes a day for 1 year.  Yearly screening should continue until it has been 15 years since you quit.  Yearly screening should stop if you develop a health problem that would prevent you from having lung cancer treatment.  Breast Cancer  Practice breast self-awareness. This means understanding how your breasts normally appear and feel.  It also means doing regular breast self-exams. Let your health care provider know about any changes, no matter how  small.  If you are in your 20s or 30s, you should have a clinical breast exam (CBE) by a health care provider every 1-3 years as part of a regular health exam.  If you are 59 or older, have a CBE every year. Also consider having a breast X-ray (mammogram) every year.  If you have a family history of breast cancer, talk to your health care provider about genetic screening.  If you are at high risk for breast cancer, talk to your health care provider about having an MRI and a mammogram every year.  Breast cancer gene (BRCA) assessment is recommended for women who have family members with BRCA-related cancers. BRCA-related cancers include:  Breast.  Ovarian.  Tubal.  Peritoneal cancers.  Results of the assessment will determine the need for genetic counseling and BRCA1 and BRCA2 testing. Cervical Cancer Your health care provider may recommend that you be screened regularly for cancer of the pelvic organs (ovaries, uterus, and vagina). This screening involves a pelvic examination, including checking for microscopic changes to the surface of your cervix (Pap test). You may be encouraged to have this screening done every 3 years, beginning at age 33.  For women ages 38-65, health care providers may recommend pelvic exams and Pap testing every 3 years, or they may recommend the Pap and pelvic exam, combined with testing for human papilloma virus (HPV), every 5 years. Some types of HPV increase your risk of cervical cancer. Testing for HPV may also be done on women of any age with unclear Pap test results.  Other health care providers may not recommend any screening for nonpregnant women who are considered low risk for pelvic cancer and who do not have symptoms. Ask your health care provider if a screening pelvic exam is right for you.  If you have had past treatment for cervical cancer or a condition that could lead to cancer, you need Pap tests and screening for cancer for at least 20 years  after your treatment. If Pap tests have been discontinued, your risk factors (such as having a new sexual partner) need to be reassessed to determine if screening should resume. Some women have medical problems that increase the chance of getting cervical cancer. In these cases, your health care provider may recommend more frequent screening and Pap tests. Colorectal Cancer  This type of cancer can be detected and often prevented.  Routine colorectal cancer screening usually begins at 55 years of age and continues through 55 years of age.  Your health care provider may recommend screening at an earlier age if you have risk factors for colon cancer.  Your health care provider may also recommend using home test kits to check for hidden blood in the stool.  A small camera at the end of a tube can be used to examine your colon directly (sigmoidoscopy or colonoscopy). This is done to check for the earliest forms of colorectal cancer.  Routine screening usually begins at age 50.  Direct examination of the colon should  be repeated every 5-10 years through 55 years of age. However, you may need to be screened more often if early forms of precancerous polyps or small growths are found. Skin Cancer  Check your skin from head to toe regularly.  Tell your health care provider about any new moles or changes in moles, especially if there is a change in a mole's shape or color.  Also tell your health care provider if you have a mole that is larger than the size of a pencil eraser.  Always use sunscreen. Apply sunscreen liberally and repeatedly throughout the day.  Protect yourself by wearing long sleeves, pants, a wide-brimmed hat, and sunglasses whenever you are outside. HEART DISEASE, DIABETES, AND HIGH BLOOD PRESSURE   High blood pressure causes heart disease and increases the risk of stroke. High blood pressure is more likely to develop in:  People who have blood pressure in the high end of the  normal range (130-139/85-89 mm Hg).  People who are overweight or obese.  People who are African American.  If you are 40-33 years of age, have your blood pressure checked every 3-5 years. If you are 40 years of age or older, have your blood pressure checked every year. You should have your blood pressure measured twice--once when you are at a hospital or clinic, and once when you are not at a hospital or clinic. Record the average of the two measurements. To check your blood pressure when you are not at a hospital or clinic, you can use:  An automated blood pressure machine at a pharmacy.  A home blood pressure monitor.  If you are between 67 years and 21 years old, ask your health care provider if you should take aspirin to prevent strokes.  Have regular diabetes screenings. This involves taking a blood sample to check your fasting blood sugar level.  If you are at a normal weight and have a low risk for diabetes, have this test once every three years after 55 years of age.  If you are overweight and have a high risk for diabetes, consider being tested at a younger age or more often. PREVENTING INFECTION  Hepatitis B  If you have a higher risk for hepatitis B, you should be screened for this virus. You are considered at high risk for hepatitis B if:  You were born in a country where hepatitis B is common. Ask your health care provider which countries are considered high risk.  Your parents were born in a high-risk country, and you have not been immunized against hepatitis B (hepatitis B vaccine).  You have HIV or AIDS.  You use needles to inject street drugs.  You live with someone who has hepatitis B.  You have had sex with someone who has hepatitis B.  You get hemodialysis treatment.  You take certain medicines for conditions, including cancer, organ transplantation, and autoimmune conditions. Hepatitis C  Blood testing is recommended for:  Everyone born from [birth year]  through 1965.  Anyone with known risk factors for hepatitis C. Sexually transmitted infections (STIs)  You should be screened for sexually transmitted infections (STIs) including gonorrhea and chlamydia if:  You are sexually active and are younger than 55 years of age.  You are older than 55 years of age and your health care provider tells you that you are at risk for this type of infection.  Your sexual activity has changed since you were last screened and you are at an increased risk for chlamydia  or gonorrhea. Ask your health care provider if you are at risk.  If you do not have HIV, but are at risk, it may be recommended that you take a prescription medicine daily to prevent HIV infection. This is called pre-exposure prophylaxis (PrEP). You are considered at risk if:  You are sexually active and do not regularly use condoms or know the HIV status of your partner(s).  You take drugs by injection.  You are sexually active with a partner who has HIV. Talk with your health care provider about whether you are at high risk of being infected with HIV. If you choose to begin PrEP, you should first be tested for HIV. You should then be tested every 3 months for as long as you are taking PrEP.  PREGNANCY   If you are premenopausal and you may become pregnant, ask your health care provider about preconception counseling.  If you may become pregnant, take 400 to 800 micrograms (mcg) of folic acid every day.  If you want to prevent pregnancy, talk to your health care provider about birth control (contraception). OSTEOPOROSIS AND MENOPAUSE   Osteoporosis is a disease in which the bones lose minerals and strength with aging. This can result in serious bone fractures. Your risk for osteoporosis can be identified using a bone density scan.  If you are 68 years of age or older, or if you are at risk for osteoporosis and fractures, ask your health care provider if you should be screened.  Ask your  health care provider whether you should take a calcium or vitamin D supplement to lower your risk for osteoporosis.  Menopause may have certain physical symptoms and risks.  Hormone replacement therapy may reduce some of these symptoms and risks. Talk to your health care provider about whether hormone replacement therapy is right for you.  HOME CARE INSTRUCTIONS   Schedule regular health, dental, and eye exams.  Stay current with your immunizations.   Do not use any tobacco products including cigarettes, chewing tobacco, or electronic cigarettes.  If you are pregnant, do not drink alcohol.  If you are breastfeeding, limit how much and how often you drink alcohol.  Limit alcohol intake to no more than 1 drink per day for nonpregnant women. One drink equals 12 ounces of beer, 5 ounces of wine, or 1 ounces of hard liquor.  Do not use street drugs.  Do not share needles.  Ask your health care provider for help if you need support or information about quitting drugs.  Tell your health care provider if you often feel depressed.  Tell your health care provider if you have ever been abused or do not feel safe at home.   This information is not intended to replace advice given to you by your health care provider. Make sure you discuss any questions you have with your health care provider.   Document Released: 08/05/2010 Document Revised: 02/10/2014 Document Reviewed: 12/22/2012 Elsevier Interactive Patient Education [employer].

## 2015-12-04 ENCOUNTER — Encounter: Payer: Self-pay | Admitting: Internal Medicine

## 2015-12-04 ENCOUNTER — Ambulatory Visit (INDEPENDENT_AMBULATORY_CARE_PROVIDER_SITE_OTHER): Payer: PRIVATE HEALTH INSURANCE | Admitting: Internal Medicine

## 2015-12-04 ENCOUNTER — Other Ambulatory Visit (INDEPENDENT_AMBULATORY_CARE_PROVIDER_SITE_OTHER): Payer: PRIVATE HEALTH INSURANCE

## 2015-12-04 VITALS — BP 128/82 | HR 79 | Temp 97.9°F | Resp 16 | Ht 63.0 in | Wt 199.0 lb

## 2015-12-04 DIAGNOSIS — M8580 Other specified disorders of bone density and structure, unspecified site: Secondary | ICD-10-CM

## 2015-12-04 DIAGNOSIS — C439 Malignant melanoma of skin, unspecified: Secondary | ICD-10-CM | POA: Diagnosis not present

## 2015-12-04 DIAGNOSIS — Z114 Encounter for screening for human immunodeficiency virus [HIV]: Secondary | ICD-10-CM

## 2015-12-04 DIAGNOSIS — Z0000 Encounter for general adult medical examination without abnormal findings: Secondary | ICD-10-CM

## 2015-12-04 DIAGNOSIS — E782 Mixed hyperlipidemia: Secondary | ICD-10-CM

## 2015-12-04 DIAGNOSIS — K219 Gastro-esophageal reflux disease without esophagitis: Secondary | ICD-10-CM

## 2015-12-04 LAB — CBC WITH DIFFERENTIAL/PLATELET
BASOS PCT: 0.8 % (ref 0.0–3.0)
Basophils Absolute: 0.1 10*3/uL (ref 0.0–0.1)
EOS PCT: 5.2 % — AB (ref 0.0–5.0)
Eosinophils Absolute: 0.4 10*3/uL (ref 0.0–0.7)
HCT: 46.3 % — ABNORMAL HIGH (ref 36.0–46.0)
HEMOGLOBIN: 15.7 g/dL — AB (ref 12.0–15.0)
LYMPHS ABS: 1.8 10*3/uL (ref 0.7–4.0)
Lymphocytes Relative: 22.1 % (ref 12.0–46.0)
MCHC: 33.9 g/dL (ref 30.0–36.0)
MCV: 83.8 fl (ref 78.0–100.0)
MONO ABS: 0.5 10*3/uL (ref 0.1–1.0)
MONOS PCT: 5.7 % (ref 3.0–12.0)
NEUTROS PCT: 66.2 % (ref 43.0–77.0)
Neutro Abs: 5.4 10*3/uL (ref 1.4–7.7)
Platelets: 225 10*3/uL (ref 150.0–400.0)
RBC: 5.52 Mil/uL — ABNORMAL HIGH (ref 3.87–5.11)
RDW: 14.9 % (ref 11.5–15.5)
WBC: 8.1 10*3/uL (ref 4.0–10.5)

## 2015-12-04 LAB — LIPID PANEL
CHOLESTEROL: 196 mg/dL (ref 0–200)
HDL: 41.5 mg/dL (ref >39.00)
LDL Cholesterol: 116 mg/dL — ABNORMAL HIGH (ref 0–99)
NonHDL: 154.37
TRIGLYCERIDES: 194 mg/dL — AB (ref 0.0–149.0)
Total CHOL/HDL Ratio: 5
VLDL: 38.8 mg/dL (ref 0.0–40.0)

## 2015-12-04 LAB — COMPREHENSIVE METABOLIC PANEL
ALBUMIN: 4.1 g/dL (ref 3.5–5.2)
ALK PHOS: 47 U/L (ref 39–117)
ALT: 13 U/L (ref 0–35)
AST: 12 U/L (ref 0–37)
BUN: 9 mg/dL (ref 6–23)
CALCIUM: 9.1 mg/dL (ref 8.4–10.5)
CHLORIDE: 101 meq/L (ref 96–112)
CO2: 30 mEq/L (ref 19–32)
CREATININE: 0.56 mg/dL (ref 0.40–1.20)
GFR: 119.36 mL/min (ref >60.00)
Glucose, Bld: 96 mg/dL (ref 70–99)
POTASSIUM: 4 meq/L (ref 3.5–5.1)
SODIUM: 139 meq/L (ref 135–145)
TOTAL PROTEIN: 6.9 g/dL (ref 6.0–8.3)
Total Bilirubin: 0.3 mg/dL (ref 0.2–1.2)

## 2015-12-04 LAB — HEMOGLOBIN A1C: HEMOGLOBIN A1C: 5.6 % (ref 4.6–6.5)

## 2015-12-04 LAB — TSH: TSH: 2.27 u[IU]/mL (ref 0.35–4.50)

## 2015-12-04 MED ORDER — PRAVASTATIN SODIUM 20 MG PO TABS: 20.0000 mg | ORAL_TABLET | Freq: Every day | ORAL | 3 refills | Status: DC

## 2015-12-04 MED ORDER — ESOMEPRAZOLE MAGNESIUM 40 MG PO CPDR: DELAYED_RELEASE_CAPSULE | ORAL | 3 refills | Status: DC

## 2015-12-04 NOTE — Assessment & Plan Note (Signed)
Check lipids, cmp Taking pravastatin 20 mg daily - continue

## 2015-12-04 NOTE — Assessment & Plan Note (Signed)
GERD controlled with daily medication Continue daily medication - discussed trying 20 mg daily or 40 mg every other day Stressed GERD diet and weight loss Discussed potential long term consequences of taking a PPI

## 2015-12-04 NOTE — Assessment & Plan Note (Signed)
dexa up to date Taking calcium and vitamin d Stressed regular walking

## 2015-12-04 NOTE — Assessment & Plan Note (Signed)
Sees Dr Tonia Brooms yearly

## 2015-12-04 NOTE — Progress Notes (Signed)
Pre visit review using our clinic review tool, if applicable. No additional management support is needed unless otherwise documented below in the visit note. 

## 2015-12-05 ENCOUNTER — Encounter: Payer: Self-pay | Admitting: Internal Medicine

## 2015-12-05 LAB — HIV ANTIBODY (ROUTINE TESTING W REFLEX): HIV 1&2 Ab, 4th Generation: NONREACTIVE

## 2016-03-24 ENCOUNTER — Ambulatory Visit (INDEPENDENT_AMBULATORY_CARE_PROVIDER_SITE_OTHER): Payer: PRIVATE HEALTH INSURANCE | Admitting: Sports Medicine

## 2016-03-24 ENCOUNTER — Ambulatory Visit
Admission: RE | Admit: 2016-03-24 | Discharge: 2016-03-24 | Disposition: A | Discharge status: Home / Self Care | Payer: PRIVATE HEALTH INSURANCE | Source: Ambulatory Visit | Attending: Sports Medicine | Admitting: Sports Medicine

## 2016-03-24 ENCOUNTER — Encounter: Payer: Self-pay | Admitting: Sports Medicine

## 2016-03-24 VITALS — BP 130/53 | Ht 63.5 in | Wt 200.0 lb

## 2016-03-24 DIAGNOSIS — M7592 Shoulder lesion, unspecified, left shoulder: Secondary | ICD-10-CM | POA: Diagnosis not present

## 2016-03-24 NOTE — Progress Notes (Signed)
   Subjective:    Patient ID: Kristin Rasmussen, female    DOB: [birth year], 56 y.o.   MRN: [id number]  HPI Kristin Rasmussen is a 56 yo F w/ PMH of calcific tendinopathy of L. Supraspinatus in for F/U on "shoulder pain." Seen 9/15 w/ similar complaint and found to have large calcific deposit in L. Supraspinatus. Pain improved w/ steroid injection and nitroglycerin patches. Reports steady increase in intermittent 6/10 ache in lateral shoulder worsened w/ shoulder abduction or external rotation. Reports partial improvement w/ 800mg  ibuprofen BID. Also reports intermittent "popping" of shoulder.   Review of Systems  ROS - as per HPI     Objective:   Physical Exam  General: well-appearing F in NAD, A&Ox3  MSK: - nl bulk in shoulder b/l - painful arc of L. Shoulder - 50 degrees external rotation of shoulders bilaterally - 4/5 strength to shoulder abduction on L.  - 5/5 strength to internal rotation/external rotation b/l - Positive Neer's and Hawkins on L.  - Negative crossover  Shoulder [place] (03/24/16): Biceps tendon intact within groove. Supraspinatous tendon w/ large hyperechoic change c/w calcific deposit increased from 9/15. Acromioclavicular joint shows mild degenerative change and a positive mushroom sign.     Assessment & Plan:  56 yo F w/ PMH of calcific tendinopathy of L. Supraspinatus now w/ increasing shoulder pain and changes on [place] consistent w/ worsening tendinopathy. DDx: calcific tendinopathy, acute tendon rupture, adhesive capsulitis. No evidence of acute tendon rupture. Nl ROM to external rotation. Pt is now amenable to more aggressive treatment for likely worsening of calcific tendinopathy. Will pursue XR today. If nl, will get MRI for consideration of operative management.  - AP and Lateral XR L. Shoulder - MRI shoulder if XR nl - Continue NSAIDs PRN - Will call w/ results   Patient seen and evaluated with the medical student. I agree with the above plan of care. Patient has  chronic supraspinatus calcific tendinopathy. She has had several months of physical therapy as well as treatment with topical nitroglycerin and subacromial cortisone injections in the past. Patient is interested in discussing surgical debridement with an orthopedist. Therefore, we will get further diagnostic imaging initially in the form of an x-ray but will then proceed with a preoperative MRI. I will call the patient with those results once available.

## 2016-03-29 ENCOUNTER — Ambulatory Visit
Admission: RE | Admit: 2016-03-29 | Discharge: 2016-03-29 | Disposition: A | Discharge status: Home / Self Care | Payer: PRIVATE HEALTH INSURANCE | Source: Ambulatory Visit | Attending: Sports Medicine | Admitting: Sports Medicine

## 2016-03-29 DIAGNOSIS — M7592 Shoulder lesion, unspecified, left shoulder: Secondary | ICD-10-CM

## 2016-03-31 ENCOUNTER — Other Ambulatory Visit: Payer: Self-pay | Admitting: *Deleted

## 2016-03-31 ENCOUNTER — Telehealth: Payer: Self-pay | Admitting: Sports Medicine

## 2016-03-31 DIAGNOSIS — M7592 Shoulder lesion, unspecified, left shoulder: Secondary | ICD-10-CM

## 2016-03-31 NOTE — Telephone Encounter (Signed)
  I spoke with the patient on the phone today after reviewing MRI findings of her left shoulder. MRI confirms what was seen on plain x-ray and ultrasound. She has calcific tendinopathy within the supraspinatus and infraspinatus tendons. No evidence of rotator cuff tear. She also has moderate acromioclavicular osteoarthritis. She has failed conservative treatment to date. Therefore, I recommend referral to Dr. Mardelle Matte at [facility] to discuss merits of arthroscopic debridement. Patient is in agreement with this plan. I'll defer further workup and treatment to the discretion of Dr. Mardelle Matte and the patient will follow-up with me as needed.

## 2016-06-03 ENCOUNTER — Ambulatory Visit (INDEPENDENT_AMBULATORY_CARE_PROVIDER_SITE_OTHER): Payer: PRIVATE HEALTH INSURANCE | Admitting: Family Medicine

## 2016-06-03 ENCOUNTER — Encounter (INDEPENDENT_AMBULATORY_CARE_PROVIDER_SITE_OTHER): Payer: Self-pay | Admitting: Family Medicine

## 2016-06-03 VITALS — BP 107/71 | HR 74 | Temp 97.7°F | Resp 14 | Ht 63.0 in | Wt 212.0 lb

## 2016-06-03 DIAGNOSIS — R5383 Other fatigue: Secondary | ICD-10-CM | POA: Diagnosis not present

## 2016-06-03 DIAGNOSIS — R0602 Shortness of breath: Secondary | ICD-10-CM | POA: Diagnosis not present

## 2016-06-03 DIAGNOSIS — Z1331 Encounter for screening for depression: Secondary | ICD-10-CM

## 2016-06-03 DIAGNOSIS — E669 Obesity, unspecified: Secondary | ICD-10-CM | POA: Diagnosis not present

## 2016-06-03 DIAGNOSIS — Z6837 Body mass index (BMI) 37.0-37.9, adult: Secondary | ICD-10-CM

## 2016-06-03 DIAGNOSIS — E784 Other hyperlipidemia: Secondary | ICD-10-CM

## 2016-06-03 DIAGNOSIS — Z0289 Encounter for other administrative examinations: Secondary | ICD-10-CM

## 2016-06-03 DIAGNOSIS — E7849 Other hyperlipidemia: Secondary | ICD-10-CM

## 2016-06-03 DIAGNOSIS — Z9189 Other specified personal risk factors, not elsewhere classified: Secondary | ICD-10-CM | POA: Diagnosis not present

## 2016-06-03 DIAGNOSIS — Z1389 Encounter for screening for other disorder: Secondary | ICD-10-CM | POA: Diagnosis not present

## 2016-06-03 NOTE — Progress Notes (Signed)
Office: [phone]  /  Fax: [phone]   HPI:   Chief Complaint: OBESITY  Kristin Rasmussen (MR# [id number]) is a 56 y.o. female who presents on 06/03/2016 for obesity evaluation and treatment. Current BMI is Body mass index is 37.55 kg/m.Annice Pih has struggled with obesity for years and has been unsuccessful in either losing weight or maintaining long term weight loss. Sirenia attended our information session and states she is currently in the action stage of change and ready to dedicate time achieving and maintaining a healthier weight.  Mendy states her family eats meals together she struggles with family and or coworkers weight loss sabotage her desired weight loss is 40 to 50 lbs she has been heavy most of  her life she started gaining weight about 4 years ago her heaviest weight ever was 213 lbs. she has significant food cravings issues  she snacks frequently in the evenings she is frequently drinking liquids with calories she frequently makes poor food choices she has problems with excessive hunger  she frequently eats larger portions than normal  she has binge eating behaviors she struggles with emotional eating    Fatigue Djuna feels her energy is lower than it should be. This has worsened with weight gain and has not worsened recently. Amunique denies daytime somnolence and  admits to waking up still tired. Patient is at risk for obstructive sleep apnea. Patent has a history of symptoms of morning fatigue. Patient generally gets 7 hours of sleep per night, and states they generally have generally restful sleep. Snoring is present. Apneic episodes are not present. Epworth Sleepiness Score is 6  Dyspnea on exertion Annice Pih notes increasing shortness of breath with exercising and seems to be worsening over time with weight gain. She notes getting out of breath sooner with activity than she used to. This has not gotten worse recently. Dulse denies  orthopnea.  Hyperlipidemia Neela has hyperlipidemia and is currently on Pravastatin. She would like to control her cholesterol levels with intensive lifestyle modification including a low saturated fat diet, exercise and weight loss. She denies any chest pain, claudication or myalgias.  At risk for cardiovascular disease Mixtli is at a higher than average risk for cardiovascular disease due to obesity and hyperlipidemia. She currently denies any chest pain.  Depression Screen Genola's Food and Mood (modified PHQ-9) score was  Depression screen PHQ 2/9 06/03/2016  Decreased Interest 1  Down, Depressed, Hopeless 1  PHQ - 2 Score 2  Altered sleeping 1  Tired, decreased energy 1  Change in appetite 1  Feeling bad or failure about yourself  1  Trouble concentrating 0  Moving slowly or fidgety/restless 0  Suicidal thoughts 0  PHQ-9 Score 6    ALLERGIES: No Known Allergies  MEDICATIONS: Current Outpatient Prescriptions on File Prior to Visit  Medication Sig Dispense Refill  . ALPRAZolam (XANAX) 0.25 MG tablet Take 12.5 mg by mouth 2 (two) times daily.      . Calcium Carbonate-Vitamin D (CALCIUM + D) 600-200 MG-UNIT TABS Take 1 tablet by mouth daily.      [name] escitalopram (LEXAPRO) 20 MG tablet Take 20 mg by mouth daily.    [name] esomeprazole (NEXIUM) 40 MG capsule TAKE ONE CAPSULE BY MOUTH ONE TIME DAILY 90 capsule 3  . estradiol (VIVELLE-DOT) 0.075 MG/24HR Apply 1 patch externally 2 times a week  5  . fish oil-omega-3 fatty acids 1000 MG capsule Take 1,200 mg by mouth daily.     . Multiple Vitamin (MULTIVITAMIN) tablet  Take 1 tablet by mouth daily.      . pravastatin (PRAVACHOL) 20 MG tablet Take 1 tablet (20 mg total) by mouth daily. 90 tablet 3  . tolterodine (DETROL LA) 4 MG 24 hr capsule Take 4 mg by mouth daily.  4   No current facility-administered medications on file prior to visit.     PAST MEDICAL HISTORY: Past Medical History:  Diagnosis Date  . Anemia 2004   HCT 34.7  .  Anxiety    follows with psyc for same  . Back pain   . Basal cell cancer    x3; Dr Danella Deis  . Bladder leak   . Chest pain 06/2010   Costochondritis  . Diverticulosis   . Gallbladder problem   . GERD (gastroesophageal reflux disease)   . Hyperlipidemia   . IBS (irritable bowel syndrome)   . Melanoma (HCC) 2006   x1  . PCOS (polycystic ovarian syndrome)   . Plantar fasciitis 08/13/2011   Left is significant and RT is mild by [place] criteria   . Spasm of esophagus   . Swelling   . Thrombocytopenia (HCC) 2002   133,000 platelets    PAST SURGICAL HISTORY: Past Surgical History:  Procedure Laterality Date  . CHOLECYSTECTOMY  2002   [facility]  . COLONOSCOPY    . MELANOMA EXCISION     LUE 2006  [facility]; Derm : Dr Campbell Stall  . SHOULDER SURGERY     Left  . TONSILLECTOMY    . TOTAL ABDOMINAL HYSTERECTOMY  1991   abnormal PAP hx    SOCIAL HISTORY: Social History  Substance Use Topics  . Smoking status: Never Smoker  . Smokeless tobacco: Never Used  . Alcohol use Yes     Comment: rarely, only holiday's    FAMILY HISTORY: Family History  Problem Relation Age of Onset  . Hypertension Father   . Colon polyps Father   . GER disease Father   . Skin cancer Father   . Ovarian cancer Paternal Aunt   . Diabetes Mother   . Hypertension Mother   . Obesity Mother   . Heart attack Maternal Grandmother 76  . Diabetes Maternal Grandmother   . Heart attack Maternal Grandfather 69  . Stroke Maternal Grandfather 40  . Colon cancer Paternal Grandfather     unsure age    ROS: Review of Systems  Constitutional: Positive for malaise/fatigue.  HENT:       Hay fever Dry mouth  Eyes:       Wear glasses or contacts  Respiratory: Positive for shortness of breath (with exertion).   Cardiovascular: Negative for chest pain, orthopnea and claudication.  Gastrointestinal: Positive for heartburn.  Genitourinary: Positive for frequency.  Musculoskeletal: Positive for back pain. Negative for  myalgias.       Leg cramping  Skin:       Dryness Hair or nail changes  Psychiatric/Behavioral:       Stress nervousness    PHYSICAL EXAM: Blood pressure 107/71, pulse 74, temperature 97.7 F (36.5 C), temperature source Oral, resp. rate 14, height 5\' 3"  (1.6 m), weight 212 lb (96.2 kg), SpO2 99 %. Body mass index is 37.55 kg/m. Physical Exam  Constitutional: She is oriented to person, place, and time. She appears well-developed and well-nourished.  Cardiovascular: Normal rate.   Pulmonary/Chest: Effort normal.  Musculoskeletal: Normal range of motion.  Neurological: She is oriented to person, place, and time.  Skin: Skin is warm and dry.  Psychiatric: She has a  normal mood and affect. Her behavior is normal.  Vitals reviewed.   RECENT LABS AND TESTS: BMET    Component Value Date/Time   NA 139 12/04/2015 0935   K 4.0 12/04/2015 0935   CL 101 12/04/2015 0935   CO2 30 12/04/2015 0935   GLUCOSE 96 12/04/2015 0935   BUN 9 12/04/2015 0935   CREATININE 0.56 12/04/2015 0935   CALCIUM 9.1 12/04/2015 0935   GFRNONAA NOT CALCULATED 06/28/2010 0355   GFRAA  06/28/2010 0355    NOT CALCULATED        The eGFR has been calculated using the MDRD equation. This calculation has not been validated in all clinical situations. eGFR's persistently <60 mL/min signify possible Chronic Kidney Disease.   Lab Results  Component Value Date   HGBA1C 5.6 12/04/2015   No results found for: INSULIN CBC    Component Value Date/Time   WBC 8.1 12/04/2015 0935   RBC 5.52 (H) 12/04/2015 0935   HGB 15.7 (H) 12/04/2015 0935   HGB 14.2 03/29/2010 1506   HCT 46.3 (H) 12/04/2015 0935   HCT 41.3 03/29/2010 1506   PLT 225.0 12/04/2015 0935   PLT 221 03/29/2010 1506   MCV 83.8 12/04/2015 0935   MCV 88.4 03/29/2010 1506   MCH 29.8 06/28/2010 0355   MCHC 33.9 12/04/2015 0935   RDW 14.9 12/04/2015 0935   RDW 13.6 03/29/2010 1506   LYMPHSABS 1.8 12/04/2015 0935   LYMPHSABS 1.6 03/29/2010 1506    MONOABS 0.5 12/04/2015 0935   MONOABS 0.4 03/29/2010 1506   EOSABS 0.4 12/04/2015 0935   EOSABS 0.2 03/29/2010 1506   BASOSABS 0.1 12/04/2015 0935   BASOSABS 0.1 03/29/2010 1506   Iron/TIBC/Ferritin/ %Sat    Component Value Date/Time   IRON 65 01/22/2009 0000   IRONPCTSAT 21.8 01/22/2009 0000   Lipid Panel     Component Value Date/Time   CHOL 196 12/04/2015 0935   TRIG 194.0 (H) 12/04/2015 0935   HDL 41.50 12/04/2015 0935   CHOLHDL 5 12/04/2015 0935   VLDL 38.8 12/04/2015 0935   LDLCALC 116 (H) 12/04/2015 0935   LDLDIRECT 156.9 01/20/2008 0000   Hepatic Function Panel     Component Value Date/Time   PROT 6.9 12/04/2015 0935   ALBUMIN 4.1 12/04/2015 0935   AST 12 12/04/2015 0935   ALT 13 12/04/2015 0935   ALKPHOS 47 12/04/2015 0935   BILITOT 0.3 12/04/2015 0935   BILIDIR 0.1 12/01/2012 1035   IBILI NOT CALCULATED 06/28/2010 0355      Component Value Date/Time   TSH 2.27 12/04/2015 0935   TSH 1.97 12/04/2014 0958   TSH 2.22 12/02/2013 0854    ECG  shows NSR with a rate of 75 BPM INDIRECT CALORIMETER done today shows a VO2 of 279 and a REE of 1937.    ASSESSMENT AND PLAN: Other fatigue - Plan: EKG 12-Lead, Vitamin B12, CBC With Differential, Comprehensive metabolic panel, Folate, Hemoglobin A1c, Insulin, random, T3, T4, free, TSH, VITAMIN D 25 Hydroxy (Vit-D Deficiency, Fractures)  Shortness of breath on exertion  Other hyperlipidemia - Plan: Lipid Panel With LDL/HDL Ratio  At risk for heart disease - Plan: Lipid Panel With LDL/HDL Ratio  Depression screening  Class 2 obesity without serious comorbidity with body mass index (BMI) of 37.0 to 37.9 in adult, unspecified obesity type  PLAN:  Fatigue Lajuanna was informed that her fatigue may be related to obesity, depression or many other causes. Labs will be ordered, and in the meanwhile Jimeka has agreed to  work on diet, exercise and weight loss to help with fatigue. Proper sleep hygiene was discussed  including the need for 7-8 hours of quality sleep each night. A sleep study was not ordered based on symptoms and Epworth score.  Dyspnea on exertion Dorinne's shortness of breath appears to be obesity related and exercise induced. She has agreed to work on weight loss and gradually increase exercise to treat her exercise induced shortness of breath. If Valaria follows our instructions and loses weight without improvement of her shortness of breath, we will plan to refer to pulmonology. We will monitor this condition regularly. Sheilia agrees to this plan.  Hyperlipidemia Keiana was informed of the American Heart Association Guidelines emphasizing intensive lifestyle modifications as the first line treatment for hyperlipidemia. We discussed many lifestyle modifications today in depth, and Evvie will continue to work on decreasing saturated fats such as fatty red meat, butter and many fried foods. She will also increase vegetables and lean protein in her diet and continue to work on exercise and weight loss efforts. We will check labs and Patrece agrees to follow up with our clinic in 2 weeks.  Cardiovascular risk counselling Yuvonne was given extended (at least 15 minutes) coronary artery disease prevention counseling today. She is 56 y.o. female and has risk factors for heart disease including obesity and hyperlipidemia. We discussed intensive lifestyle modifications today with an emphasis on specific weight loss instructions and strategies. Pt was also informed of the importance of increasing exercise and decreasing saturated fats to help prevent heart disease.  Depression Screen Kaileia had a mildly positive depression screening. Depression is commonly associated with obesity and often results in emotional eating behaviors. We will monitor this closely and work on CBT to help improve the non-hunger eating patterns. Referral to Psychology may be required if no improvement is seen as she continues in our  clinic.  Obesity Lajean is currently in the action stage of change and her goal is to continue with weight loss efforts She has agreed to follow the Category 2 plan +100 calories Avea has been instructed to work up to a goal of 150 minutes of combined cardio and strengthening exercise per week for weight loss and overall health benefits. We discussed the following Behavioral Modification Stratagies today: increasing lean protein intake, work on meal planning and easy cooking plans and dealing with family or coworker sabotage  Alicea has agreed to follow up with our clinic in 2 weeks. She was informed of the importance of frequent follow up visits to maximize her success with intensive lifestyle modifications for her multiple health conditions. She was informed we would discuss her lab results at her next visit unless there is a critical issue that needs to be addressed sooner. Phallon agreed to keep her next visit at the agreed upon time to discuss these results.  I, Doreene Nest, am acting as scribe for Dennard Nip, MD  I have reviewed the above documentation for accuracy and completeness, and I agree with the above. -Dennard Nip, MD

## 2016-06-04 LAB — COMPREHENSIVE METABOLIC PANEL
ALK PHOS: 52 IU/L (ref 39–117)
ALT: 20 IU/L (ref 0–32)
AST: 15 IU/L (ref 0–40)
Albumin/Globulin Ratio: 1.7 (ref 1.2–2.2)
Albumin: 4.2 g/dL (ref 3.5–5.5)
BUN / CREAT RATIO: 17 (ref 9–23)
BUN: 8 mg/dL (ref 6–24)
Bilirubin Total: 0.3 mg/dL (ref 0.0–1.2)
CO2: 29 mmol/L (ref 18–29)
Calcium: 9.1 mg/dL (ref 8.7–10.2)
Chloride: 95 mmol/L — ABNORMAL LOW (ref 96–106)
Creatinine, Ser: 0.48 mg/dL — ABNORMAL LOW (ref 0.57–1.00)
GFR calc Af Amer: 128 mL/min/{1.73_m2} (ref >59)
GFR, EST NON AFRICAN AMERICAN: 111 mL/min/{1.73_m2} (ref >59)
GLOBULIN, TOTAL: 2.5 g/dL (ref 1.5–4.5)
Glucose: 87 mg/dL (ref 65–99)
POTASSIUM: 4.2 mmol/L (ref 3.5–5.2)
SODIUM: 140 mmol/L (ref 134–144)
Total Protein: 6.7 g/dL (ref 6.0–8.5)

## 2016-06-04 LAB — CBC WITH DIFFERENTIAL
BASOS ABS: 0 10*3/uL (ref 0.0–0.2)
Basos: 0 %
EOS (ABSOLUTE): 0.2 10*3/uL (ref 0.0–0.4)
Eos: 3 %
Hematocrit: 43.3 % (ref 34.0–46.6)
Hemoglobin: 14.6 g/dL (ref 11.1–15.9)
Immature Grans (Abs): 0 10*3/uL (ref 0.0–0.1)
Immature Granulocytes: 0 %
LYMPHS ABS: 1.5 10*3/uL (ref 0.7–3.1)
Lymphs: 20 %
MCH: 29.6 pg (ref 26.6–33.0)
MCHC: 33.7 g/dL (ref 31.5–35.7)
MCV: 88 fL (ref 79–97)
MONOS ABS: 0.4 10*3/uL (ref 0.1–0.9)
Monocytes: 6 %
NEUTROS ABS: 5.4 10*3/uL (ref 1.4–7.0)
Neutrophils: 71 %
RBC: 4.93 x10E6/uL (ref 3.77–5.28)
RDW: 14.5 % (ref 12.3–15.4)
WBC: 7.6 10*3/uL (ref 3.4–10.8)

## 2016-06-04 LAB — TSH: TSH: 1.55 u[IU]/mL (ref 0.450–4.500)

## 2016-06-04 LAB — T4, FREE: FREE T4: 1.21 ng/dL (ref 0.82–1.77)

## 2016-06-04 LAB — HEMOGLOBIN A1C
Est. average glucose Bld gHb Est-mCnc: 108 mg/dL
Hgb A1c MFr Bld: 5.4 % (ref 4.8–5.6)

## 2016-06-04 LAB — LIPID PANEL WITH LDL/HDL RATIO
Cholesterol, Total: 215 mg/dL — ABNORMAL HIGH (ref 100–199)
HDL: 46 mg/dL (ref >39)
LDL CALC: 132 mg/dL — AB (ref 0–99)
LDl/HDL Ratio: 2.9 ratio (ref 0.0–3.2)
Triglycerides: 185 mg/dL — ABNORMAL HIGH (ref 0–149)
VLDL CHOLESTEROL CAL: 37 mg/dL (ref 5–40)

## 2016-06-04 LAB — VITAMIN B12: Vitamin B-12: 761 pg/mL (ref 232–1245)

## 2016-06-04 LAB — INSULIN, RANDOM: INSULIN: 7.4 u[IU]/mL (ref 2.6–24.9)

## 2016-06-04 LAB — VITAMIN D 25 HYDROXY (VIT D DEFICIENCY, FRACTURES): Vit D, 25-Hydroxy: 29.1 ng/mL — ABNORMAL LOW (ref 30.0–100.0)

## 2016-06-04 LAB — T3: T3 TOTAL: 118 ng/dL (ref 71–180)

## 2016-06-04 LAB — FOLATE

## 2016-06-16 ENCOUNTER — Ambulatory Visit (INDEPENDENT_AMBULATORY_CARE_PROVIDER_SITE_OTHER): Payer: PRIVATE HEALTH INSURANCE | Admitting: Family Medicine

## 2016-06-16 VITALS — BP 103/63 | HR 84 | Temp 97.7°F | Ht 63.0 in | Wt 207.0 lb

## 2016-06-16 DIAGNOSIS — F3289 Other specified depressive episodes: Secondary | ICD-10-CM

## 2016-06-16 DIAGNOSIS — E559 Vitamin D deficiency, unspecified: Secondary | ICD-10-CM | POA: Diagnosis not present

## 2016-06-16 DIAGNOSIS — Z6836 Body mass index (BMI) 36.0-36.9, adult: Secondary | ICD-10-CM

## 2016-06-16 DIAGNOSIS — E784 Other hyperlipidemia: Secondary | ICD-10-CM

## 2016-06-16 DIAGNOSIS — Z9189 Other specified personal risk factors, not elsewhere classified: Secondary | ICD-10-CM

## 2016-06-16 DIAGNOSIS — E7849 Other hyperlipidemia: Secondary | ICD-10-CM

## 2016-06-16 DIAGNOSIS — E669 Obesity, unspecified: Secondary | ICD-10-CM | POA: Diagnosis not present

## 2016-06-16 DIAGNOSIS — E8881 Metabolic syndrome: Secondary | ICD-10-CM | POA: Diagnosis not present

## 2016-06-16 MED ORDER — VITAMIN D (ERGOCALCIFEROL) 1.25 MG (50000 UNIT) PO CAPS: 50000.0000 [IU] | ORAL_CAPSULE | ORAL | 0 refills | Status: DC

## 2016-06-16 NOTE — Progress Notes (Signed)
Office: [phone]  /  Fax: [phone]   HPI:   Chief Complaint: OBESITY Kristin Rasmussen is here to discuss her progress with her obesity treatment plan. She is on the Category 2 plan +100 calories and is following her eating plan approximately 100 % of the time. She states she is exercising 0 minutes 0 times per week. Kristin Rasmussen has done well with weight loss. She notes some mild morning hunger and struggled to eat all of her dinner portions. She did note some cravings. Her weight is 207 lb (93.9 kg) today and has had a weight loss of 5 pounds over a period of 2 weeks since her last visit. She has lost 5 lbs since starting treatment with [place].  Vitamin D deficiency Kristin Rasmussen has a diagnosis of vitamin D deficiency. Kristin Rasmussen is on a multi-vitamin, not yet at goal and she has history of osteopenia. She is not currently taking vit D and denies nausea, vomiting or muscle weakness.  Hyperlipidemia Kristin Rasmussen has hyperlipidemia and she is currently on Pravachol 40 mg and LDL is still > 100. She has been trying to improve her cholesterol levels with intensive lifestyle modification including a low saturated fat diet, exercise and weight loss. She denies any chest pain, claudication or myalgias.  Insulin Resistance Kristin Rasmussen has a diagnosis of insulin resistance based on her elevated fasting insulin level >5 and has had a slightly elevated A1c of 5.6 last year. She has a positive strong family history of DM II. Although Kristin Rasmussen's A1c and blood glucose readings are still under good control, insulin resistance puts her at greater risk of metabolic syndrome and diabetes. She is not taking metformin currently and continues to work on diet and exercise to decrease risk of diabetes.  At risk for diabetes Kristin Rasmussen is at higher than average risk for developing diabetes due to her obesity and insulin resistance. She currently denies polyuria or polydipsia.  Depression with emotional eating behaviors Kristin Rasmussen notes mood is stable on  Lexapro but is still struggling with emotional eating and using food for comfort to the extent that it is negatively impacting her health. She often snacks when she is not hungry. Kristin Rasmussen sometimes feels she is out of control and then feels guilty that she made poor food choices. She has been working on behavior modification techniques to help reduce her emotional eating and has been somewhat successful. She shows no sign of suicidal or homicidal ideations.  Depression screen [facility] 2/9 06/03/2016 03/24/2016 09/19/2014 08/01/2014 01/25/2014  Decreased Interest 1 0 0 0 0  Down, Depressed, Hopeless 1 0 0 0 0  PHQ - 2 Score 2 0 0 0 0  Altered sleeping 1 - - - -  Tired, decreased energy 1 - - - -  Change in appetite 1 - - - -  Feeling bad or failure about yourself  1 - - - -  Trouble concentrating 0 - - - -  Moving slowly or fidgety/restless 0 - - - -  Suicidal thoughts 0 - - - -  PHQ-9 Score 6 - - - -     Wt Readings from Last 500 Encounters:  06/16/16 207 lb (93.9 kg)  06/03/16 212 lb (96.2 kg)  03/24/16 200 lb (90.7 kg)  12/04/15 199 lb (90.3 kg)  12/29/14 197 lb (89.4 kg)  12/04/14 199 lb (90.3 kg)  09/19/14 200 lb (90.7 kg)  08/01/14 200 lb (90.7 kg)  04/27/14 195 lb (88.5 kg)  01/25/14 195 lb (88.5 kg)  12/02/13 197 lb 12.8  oz (89.7 kg)  11/29/13 195 lb (88.5 kg)  10/18/13 195 lb (88.5 kg)  12/01/12 192 lb 6.4 oz (87.3 kg)  05/31/12 198 lb (89.8 kg)  05/17/12 198 lb (89.8 kg)  12/23/11 190 lb (86.2 kg)  10/24/11 190 lb 12.8 oz (86.5 kg)  09/18/11 190 lb (86.2 kg)  08/13/11 190 lb (86.2 kg)  06/04/11 191 lb 12.8 oz (87 kg)  10/25/10 185 lb 9.6 oz (84.2 kg)  07/17/10 184 lb 3.2 oz (83.6 kg)  07/05/10 183 lb (83 kg)  10/23/09 180 lb 9.6 oz (81.9 kg)  03/26/09 180 lb 3.2 oz (81.7 kg)  01/22/09 174 lb 3.2 oz (79 kg)  06/19/08 186 lb 12.8 oz (84.7 kg)  01/20/08 179 lb 9.6 oz (81.5 kg)  10/28/06 165 lb 8 oz (75.1 kg)     ALLERGIES: No Known Allergies  MEDICATIONS: Current  Outpatient Prescriptions on File Prior to Visit  Medication Sig Dispense Refill  . acetaminophen (TYLENOL) 500 MG tablet Take 500 mg by mouth every 6 (six) hours as needed.    . ALPRAZolam (XANAX) 0.25 MG tablet Take 12.5 mg by mouth 2 (two) times daily.      . baclofen (LIORESAL) 10 MG tablet Take 10 mg by mouth 3 (three) times daily.    . Calcium Carbonate-Vitamin D (CALCIUM + D) 600-200 MG-UNIT TABS Take 1 tablet by mouth daily.      . cetirizine (ZYRTEC) 10 MG tablet Take 10 mg by mouth daily.    . diphenhydrAMINE (BENADRYL) 25 mg capsule Take 25 mg by mouth every 6 (six) hours as needed.    . docusate sodium (COLACE) 100 MG capsule Take 100 mg by mouth daily as needed for mild constipation.    [name] escitalopram (LEXAPRO) 20 MG tablet Take 20 mg by mouth daily.    [name] esomeprazole (NEXIUM) 40 MG capsule TAKE ONE CAPSULE BY MOUTH ONE TIME DAILY 90 capsule 3  . estradiol (VIVELLE-DOT) 0.075 MG/24HR Apply 1 patch externally 2 times a week  5  . fish oil-omega-3 fatty acids 1000 MG capsule Take 1,200 mg by mouth daily.     . magnesium hydroxide (MILK OF MAGNESIA) 400 MG/5ML suspension Take by mouth daily as needed for mild constipation.    . Multiple Vitamin (MULTIVITAMIN) tablet Take 1 tablet by mouth daily.      . pravastatin (PRAVACHOL) 20 MG tablet Take 1 tablet (20 mg total) by mouth daily. 90 tablet 3  . tolterodine (DETROL LA) 4 MG 24 hr capsule Take 4 mg by mouth daily.  4   No current facility-administered medications on file prior to visit.     PAST MEDICAL HISTORY: Past Medical History:  Diagnosis Date  . Anemia 2004   HCT 34.7  . Anxiety    follows with psyc for same  . Back pain   . Basal cell cancer    x3; Dr Tonia Brooms  . Bladder leak   . Chest pain 06/2010   Costochondritis  . Diverticulosis   . Gallbladder problem   . GERD (gastroesophageal reflux disease)   . Hyperlipidemia   . IBS (irritable bowel syndrome)   . Melanoma ([place]) 2006   x1  . PCOS (polycystic ovarian  syndrome)   . Plantar fasciitis 08/13/2011   Left is significant and RT is mild by [place] criteria   . Spasm of esophagus   . Swelling   . Thrombocytopenia ([place]) 2002   133,000 platelets    PAST SURGICAL HISTORY: Past Surgical History:  Procedure Laterality Date  . CHOLECYSTECTOMY  2002   [facility]  . COLONOSCOPY    . MELANOMA EXCISION     LUE 2006  [facility]; Derm : Dr Crista Luria  . SHOULDER SURGERY     Left  . TONSILLECTOMY    . TOTAL ABDOMINAL HYSTERECTOMY  1991   abnormal PAP hx    SOCIAL HISTORY: Social History  Substance Use Topics  . Smoking status: Never Smoker  . Smokeless tobacco: Never Used  . Alcohol use Yes     Comment: rarely, only holiday's    FAMILY HISTORY: Family History  Problem Relation Age of Onset  . Hypertension Father   . Colon polyps Father   . GER disease Father   . Skin cancer Father   . Ovarian cancer Paternal Aunt   . Diabetes Mother   . Hypertension Mother   . Obesity Mother   . Heart attack Maternal Grandmother 76  . Diabetes Maternal Grandmother   . Heart attack Maternal Grandfather 69  . Stroke Maternal Grandfather 28  . Colon cancer Paternal Grandfather        unsure age    ROS: Review of Systems  Constitutional: Positive for weight loss.  Cardiovascular: Negative for chest pain and claudication.  Gastrointestinal: Negative for nausea and vomiting.  Genitourinary: Negative for frequency.  Musculoskeletal: Negative for myalgias.       Negative muscle weakness  Endo/Heme/Allergies: Negative for polydipsia.  Psychiatric/Behavioral: Positive for depression. Negative for suicidal ideas.    PHYSICAL EXAM: Blood pressure 103/63, pulse 84, temperature 97.7 F (36.5 C), temperature source Oral, height 5\' 3"  (1.6 m), weight 207 lb (93.9 kg), SpO2 98 %. Body mass index is 36.67 kg/m. Physical Exam  Constitutional: She is oriented to person, place, and time. She appears well-developed and well-nourished.  Cardiovascular: Normal rate.     Pulmonary/Chest: Effort normal.  Musculoskeletal: Normal range of motion.  Neurological: She is oriented to person, place, and time.  Skin: Skin is warm and dry.  Psychiatric: She has a normal mood and affect. Her behavior is normal.  Vitals reviewed.   RECENT LABS AND TESTS: BMET    Component Value Date/Time   NA 140 06/03/2016 1253   K 4.2 06/03/2016 1253   CL 95 (L) 06/03/2016 1253   CO2 29 06/03/2016 1253   GLUCOSE 87 06/03/2016 1253   GLUCOSE 96 12/04/2015 0935   BUN 8 06/03/2016 1253   CREATININE 0.48 (L) 06/03/2016 1253   CALCIUM 9.1 06/03/2016 1253   GFRNONAA 111 06/03/2016 1253   GFRAA 128 06/03/2016 1253   Lab Results  Component Value Date   HGBA1C 5.4 06/03/2016   HGBA1C 5.6 12/04/2015   Lab Results  Component Value Date   INSULIN 7.4 06/03/2016   CBC    Component Value Date/Time   WBC 7.6 06/03/2016 1253   WBC 8.1 12/04/2015 0935   RBC 4.93 06/03/2016 1253   RBC 5.52 (H) 12/04/2015 0935   HGB 15.7 (H) 12/04/2015 0935   HGB 14.2 03/29/2010 1506   HCT 43.3 06/03/2016 1253   HCT 41.3 03/29/2010 1506   PLT 225.0 12/04/2015 0935   PLT 221 03/29/2010 1506   MCV 88 06/03/2016 1253   MCV 88.4 03/29/2010 1506   MCH 29.6 06/03/2016 1253   MCH 29.8 06/28/2010 0355   MCHC 33.7 06/03/2016 1253   MCHC 33.9 12/04/2015 0935   RDW 14.5 06/03/2016 1253   RDW 13.6 03/29/2010 1506   LYMPHSABS 1.5 06/03/2016 1253   LYMPHSABS 1.6  03/29/2010 1506   MONOABS 0.5 12/04/2015 0935   MONOABS 0.4 03/29/2010 1506   EOSABS 0.2 06/03/2016 1253   BASOSABS 0.0 06/03/2016 1253   BASOSABS 0.1 03/29/2010 1506   Iron/TIBC/Ferritin/ %Sat    Component Value Date/Time   IRON 65 01/22/2009 0000   IRONPCTSAT 21.8 01/22/2009 0000   Lipid Panel     Component Value Date/Time   CHOL 215 (H) 06/03/2016 1253   TRIG 185 (H) 06/03/2016 1253   HDL 46 06/03/2016 1253   CHOLHDL 5 12/04/2015 0935   VLDL 38.8 12/04/2015 0935   LDLCALC 132 (H) 06/03/2016 1253   LDLDIRECT 156.9  01/20/2008 0000   Hepatic Function Panel     Component Value Date/Time   PROT 6.7 06/03/2016 1253   ALBUMIN 4.2 06/03/2016 1253   AST 15 06/03/2016 1253   ALT 20 06/03/2016 1253   ALKPHOS 52 06/03/2016 1253   BILITOT 0.3 06/03/2016 1253   BILIDIR 0.1 12/01/2012 1035   IBILI NOT CALCULATED 06/28/2010 0355      Component Value Date/Time   TSH 1.550 06/03/2016 1253   TSH 2.27 12/04/2015 0935   TSH 1.97 12/04/2014 0958    ASSESSMENT AND PLAN: Other hyperlipidemia  Vitamin D deficiency - Plan: Vitamin D, Ergocalciferol, (DRISDOL) 50000 units CAPS capsule  Insulin resistance  Other depression  At risk for diabetes mellitus  Class 2 obesity without serious comorbidity with body mass index (BMI) of 36.0 to 36.9 in adult, unspecified obesity type  PLAN:  Vitamin D Deficiency Kristin Rasmussen was informed that low vitamin D levels contributes to fatigue and are associated with obesity, breast, and colon cancer. She agrees to start  to take prescription Vit D @50 ,000 IU every week #4 with no refills and continue to take multi-vitamin. We will re-check labs in 3 months and will follow up for routine testing of vitamin D, at least 2-3 times per year. She was informed of the risk of over-replacement of vitamin D and agrees to not increase her dose unless he discusses this with [place] first. Kristin Rasmussen agrees to follow up with our clinic in 2 weeks.  Hyperlipidemia Kristin Rasmussen was informed of the American Heart Association Guidelines emphasizing intensive lifestyle modifications as the first line treatment for hyperlipidemia. We discussed many lifestyle modifications today in depth, and Kristin Rasmussen will continue to work on decreasing saturated fats such as fatty red meat, butter and many fried foods. She will also increase vegetables and lean protein in her diet and continue to work on exercise and weight loss efforts. Kristin Rasmussen agrees to continue Pravachol and follow up as directed.  Insulin Resistance Kristin Rasmussen will  continue to work on weight loss, exercise, and decreasing simple carbohydrates in her diet to help decrease the risk of diabetes. We dicussed metformin including benefits and risks. She was informed that eating too many simple carbohydrates or too many calories at one sitting increases the likelihood of GI side effects. Smt declined metformin for now and prescription was not written today. Kristin Rasmussen agreed to follow up with [place] as directed to monitor her progress.  Diabetes risk counselling Kristin Rasmussen was given extended (at least 15 minutes) diabetes prevention counseling today. She is 56 y.o. female and has risk factors for diabetes including obesity and insulin resistance. We discussed intensive lifestyle modifications today with an emphasis on weight loss as well as increasing exercise and decreasing simple carbohydrates in her diet. Kristin Rasmussen agreed to follow up as directed.  Depression with Emotional Eating Behaviors We discussed behavior modification techniques today to help  Kristin Rasmussen deal with her emotional eating and depression. She has agreed to continue to take Lexapro 20 mg daily and agreed to follow up as directed.  Obesity Kristin Rasmussen is currently in the action stage of change. As such, her goal is to continue with weight loss efforts She has agreed to follow the Category 2 plan Kristin Rasmussen has been instructed to work up to a goal of 150 minutes of combined cardio and strengthening exercise per week for weight loss and overall health benefits. We discussed the following Behavioral Modification Strategies today: increasing H2O, increasing lean protein intake, decreasing simple carbohydrates  and decreasing sodium intake  Kristin Rasmussen has agreed to follow up with our clinic in 2 weeks. She was informed of the importance of frequent follow up visits to maximize her success with intensive lifestyle modifications for her multiple health conditions.  I, Doreene Nest, am acting as scribe for Dennard Nip, MD  I have  reviewed the above documentation for accuracy and completeness, and I agree with the above. -Dennard Nip, MD

## 2016-06-17 ENCOUNTER — Ambulatory Visit (INDEPENDENT_AMBULATORY_CARE_PROVIDER_SITE_OTHER): Payer: PRIVATE HEALTH INSURANCE | Admitting: Family Medicine

## 2016-07-01 ENCOUNTER — Ambulatory Visit (INDEPENDENT_AMBULATORY_CARE_PROVIDER_SITE_OTHER): Payer: PRIVATE HEALTH INSURANCE | Admitting: Family Medicine

## 2016-07-01 VITALS — BP 110/73 | HR 81 | Temp 98.0°F | Ht 63.0 in | Wt 203.0 lb

## 2016-07-01 DIAGNOSIS — E784 Other hyperlipidemia: Secondary | ICD-10-CM | POA: Diagnosis not present

## 2016-07-01 DIAGNOSIS — E7849 Other hyperlipidemia: Secondary | ICD-10-CM

## 2016-07-01 DIAGNOSIS — E669 Obesity, unspecified: Secondary | ICD-10-CM | POA: Diagnosis not present

## 2016-07-01 DIAGNOSIS — Z6836 Body mass index (BMI) 36.0-36.9, adult: Secondary | ICD-10-CM | POA: Diagnosis not present

## 2016-07-02 NOTE — Progress Notes (Signed)
Office: [phone]  /  Fax: [phone]   HPI:   Chief Complaint: OBESITY Kristin Rasmussen is here to discuss her progress with her obesity treatment plan. She is on the  follow the Category 2 plan and is following her eating plan approximately 100 % of the time. She states she is exercising 0 minutes 0 times per week. Kristin Rasmussen continues to do well with weight loss even with increased celebration eating, but did a good job with portion control. She had increased temptations and social eating. Her weight is 203 lb (92.1 kg) today and has had a weight loss of 4 pounds over a period of 2 weeks since her last visit. She has lost 9 lbs since starting treatment with [place].  Hyperlipidemia Kristin Rasmussen has hyperlipidemia and has been attempting to improve her cholesterol levels with intensive lifestyle modification including a low saturated fat diet, exercise and weight loss. She is doing well on plan. She denies any chest pain, claudication or myalgias.  ALLERGIES: No Known Allergies  MEDICATIONS: Current Outpatient Prescriptions on File Prior to Visit  Medication Sig Dispense Refill   acetaminophen (TYLENOL) 500 MG tablet Take 500 mg by mouth every 6 (six) hours as needed.     ALPRAZolam (XANAX) 0.25 MG tablet Take 12.5 mg by mouth 2 (two) times daily.       baclofen (LIORESAL) 10 MG tablet Take 10 mg by mouth 3 (three) times daily.     Calcium Carbonate-Vitamin D (CALCIUM + D) 600-200 MG-UNIT TABS Take 1 tablet by mouth daily.       cetirizine (ZYRTEC) 10 MG tablet Take 10 mg by mouth daily.     diphenhydrAMINE (BENADRYL) 25 mg capsule Take 25 mg by mouth every 6 (six) hours as needed.     docusate sodium (COLACE) 100 MG capsule Take 100 mg by mouth daily as needed for mild constipation.     escitalopram (LEXAPRO) 20 MG tablet Take 20 mg by mouth daily.     esomeprazole (NEXIUM) 40 MG capsule TAKE ONE CAPSULE BY MOUTH ONE TIME DAILY 90 capsule 3   estradiol (VIVELLE-DOT) 0.075 MG/24HR Apply 1  patch externally 2 times a week  5   fish oil-omega-3 fatty acids 1000 MG capsule Take 1,200 mg by mouth daily.      magnesium hydroxide (MILK OF MAGNESIA) 400 MG/5ML suspension Take by mouth daily as needed for mild constipation.     Multiple Vitamin (MULTIVITAMIN) tablet Take 1 tablet by mouth daily.       pravastatin (PRAVACHOL) 20 MG tablet Take 1 tablet (20 mg total) by mouth daily. 90 tablet 3   tolterodine (DETROL LA) 4 MG 24 hr capsule Take 4 mg by mouth daily.  4   Vitamin D, Ergocalciferol, (DRISDOL) 50000 units CAPS capsule Take 1 capsule (50,000 Units total) by mouth every 7 (seven) days. 4 capsule 0   No current facility-administered medications on file prior to visit.     PAST MEDICAL HISTORY: Past Medical History:  Diagnosis Date   Anemia 2004   HCT 34.7   Anxiety    follows with psyc for same   Back pain    Basal cell cancer    x3; Dr Tonia Brooms   Bladder leak    Chest pain 06/2010   Costochondritis   Diverticulosis    Gallbladder problem    GERD (gastroesophageal reflux disease)    Hyperlipidemia    IBS (irritable bowel syndrome)    Melanoma ([place]) 2006   x1   PCOS (  polycystic ovarian syndrome)    Plantar fasciitis 08/13/2011   Left is significant and RT is mild by [place] criteria    Spasm of esophagus    Swelling    Thrombocytopenia ([place]) 2002   133,000 platelets    PAST SURGICAL HISTORY: Past Surgical History:  Procedure Laterality Date   CHOLECYSTECTOMY  2002   [facility]   COLONOSCOPY     MELANOMA EXCISION     LUE 2006  [facility]; Derm : Dr Crista Luria   SHOULDER SURGERY     Left   TONSILLECTOMY     TOTAL ABDOMINAL HYSTERECTOMY  1991   abnormal PAP hx    SOCIAL HISTORY: Social History  Substance Use Topics   Smoking status: Never Smoker   Smokeless tobacco: Never Used   Alcohol use Yes     Comment: rarely, only holiday's    FAMILY HISTORY: Family History  Problem Relation Age of Onset   Hypertension Father    Colon  polyps Father    GER disease Father    Skin cancer Father    Ovarian cancer Paternal Aunt    Diabetes Mother    Hypertension Mother    Obesity Mother    Heart attack Maternal Grandmother 76   Diabetes Maternal Grandmother    Heart attack Maternal Grandfather 71   Stroke Maternal Grandfather 34   Colon cancer Paternal Grandfather        unsure age    ROS: Review of Systems  Constitutional: Positive for weight loss.  Cardiovascular: Negative for chest pain and claudication.  Musculoskeletal: Negative for myalgias.    PHYSICAL EXAM: Blood pressure 110/73, pulse 81, temperature 98 F (36.7 C), temperature source Oral, height 5\' 3"  (1.6 m), weight 203 lb (92.1 kg), SpO2 97 %. Body mass index is 35.96 kg/m. Physical Exam  Constitutional: She is oriented to person, place, and time. She appears well-developed and well-nourished.  Cardiovascular: Normal rate.   Pulmonary/Chest: Effort normal.  Musculoskeletal: Normal range of motion.  Neurological: She is oriented to person, place, and time.  Skin: Skin is warm and dry.  Psychiatric: She has a normal mood and affect. Her behavior is normal.  Vitals reviewed.   RECENT LABS AND TESTS: BMET    Component Value Date/Time   NA 140 06/03/2016 1253   K 4.2 06/03/2016 1253   CL 95 (L) 06/03/2016 1253   CO2 29 06/03/2016 1253   GLUCOSE 87 06/03/2016 1253   GLUCOSE 96 12/04/2015 0935   BUN 8 06/03/2016 1253   CREATININE 0.48 (L) 06/03/2016 1253   CALCIUM 9.1 06/03/2016 1253   GFRNONAA 111 06/03/2016 1253   GFRAA 128 06/03/2016 1253   Lab Results  Component Value Date   HGBA1C 5.4 06/03/2016   HGBA1C 5.6 12/04/2015   Lab Results  Component Value Date   INSULIN 7.4 06/03/2016   CBC    Component Value Date/Time   WBC 7.6 06/03/2016 1253   WBC 8.1 12/04/2015 0935   RBC 4.93 06/03/2016 1253   RBC 5.52 (H) 12/04/2015 0935   HGB 15.7 (H) 12/04/2015 0935   HGB 14.2 03/29/2010 1506   HCT 43.3 06/03/2016 1253    HCT 41.3 03/29/2010 1506   PLT 225.0 12/04/2015 0935   PLT 221 03/29/2010 1506   MCV 88 06/03/2016 1253   MCV 88.4 03/29/2010 1506   MCH 29.6 06/03/2016 1253   MCH 29.8 06/28/2010 0355   MCHC 33.7 06/03/2016 1253   MCHC 33.9 12/04/2015 0935   RDW 14.5 06/03/2016 1253  RDW 13.6 03/29/2010 1506   LYMPHSABS 1.5 06/03/2016 1253   LYMPHSABS 1.6 03/29/2010 1506   MONOABS 0.5 12/04/2015 0935   MONOABS 0.4 03/29/2010 1506   EOSABS 0.2 06/03/2016 1253   BASOSABS 0.0 06/03/2016 1253   BASOSABS 0.1 03/29/2010 1506   Iron/TIBC/Ferritin/ %Sat    Component Value Date/Time   IRON 65 01/22/2009 0000   IRONPCTSAT 21.8 01/22/2009 0000   Lipid Panel     Component Value Date/Time   CHOL 215 (H) 06/03/2016 1253   TRIG 185 (H) 06/03/2016 1253   HDL 46 06/03/2016 1253   CHOLHDL 5 12/04/2015 0935   VLDL 38.8 12/04/2015 0935   LDLCALC 132 (H) 06/03/2016 1253   LDLDIRECT 156.9 01/20/2008 0000   Hepatic Function Panel     Component Value Date/Time   PROT 6.7 06/03/2016 1253   ALBUMIN 4.2 06/03/2016 1253   AST 15 06/03/2016 1253   ALT 20 06/03/2016 1253   ALKPHOS 52 06/03/2016 1253   BILITOT 0.3 06/03/2016 1253   BILIDIR 0.1 12/01/2012 1035   IBILI NOT CALCULATED 06/28/2010 0355      Component Value Date/Time   TSH 1.550 06/03/2016 1253   TSH 2.27 12/04/2015 0935   TSH 1.97 12/04/2014 0958    ASSESSMENT AND PLAN: Other hyperlipidemia  Class 2 obesity without serious comorbidity with body mass index (BMI) of 36.0 to 36.9 in adult, unspecified obesity type  PLAN:  Hyperlipidemia [place] was informed of the American Heart Association Guidelines emphasizing intensive lifestyle modifications as the first line treatment for hyperlipidemia. We discussed many lifestyle modifications today in depth, and Jayliani will continue to work on decreasing saturated fats such as fatty red meat, butter and many fried foods. She will also increase vegetables and lean protein in her diet and continue to  work on exercise and weight loss efforts. We will re-check labs in 2 months and Kristin Rasmussen agrees to follow up with our clinic in 2 weeks.  Obesity Kristin Rasmussen is currently in the action stage of change. As such, her goal is to continue with weight loss efforts She has agreed to follow the Category 2 plan with vegetarian breakfast or lunch Kristin Rasmussen has been instructed to work up to a goal of 150 minutes of combined cardio and strengthening exercise per week for weight loss and overall health benefits. We discussed the following Behavioral Modification Strategies today: increasing lean protein intake, decreasing simple carbohydrates , work on meal planning and easy cooking plans and avoiding temptations  Kristin Rasmussen has agreed to follow up with our clinic in 2 weeks. She was informed of the importance of frequent follow up visits to maximize her success with intensive lifestyle modifications for her multiple health conditions.  Kristin Rasmussen, am acting as scribe for Dennard Nip, MD  OBESITY BEHAVIORAL INTERVENTION VISIT  Today's visit was # 3 out of 22.  Starting weight: 212 lbs Starting date: 06/03/16 Today's weight : 203 lbs Today's date: 07/01/2016 Total lbs lost to date: 9 (Patients must lose 7 lbs in the first 6 months to continue with counseling)   ASK: We discussed the diagnosis of obesity with Kristin Rasmussen today and Kristin Rasmussen agreed to give [place] permission to discuss obesity behavioral modification therapy today.  ASSESS: Kristin Rasmussen has the diagnosis of obesity and her BMI today is @TBMI @ Kristin Rasmussen is in the action stage of change   ADVISE: Kristin Rasmussen was educated on the multiple health risks of obesity as well as the benefit of weight loss to improve her health. She was  advised of the need for long term treatment and the importance of lifestyle modifications.  AGREE: Multiple dietary modification options and treatment options were discussed and  Kristin Rasmussen agreed to follow the Category 2 plan with  vegetarian breakfast or lunch. We discussed the following Behavioral Modification Strategies today: increasing lean protein intake, decreasing simple carbohydrates , work on meal planning and easy cooking plans and avoiding temptations    I have reviewed the above documentation for accuracy and completeness, and I agree with the above. -Dennard Nip, MD

## 2016-07-15 ENCOUNTER — Ambulatory Visit (INDEPENDENT_AMBULATORY_CARE_PROVIDER_SITE_OTHER): Payer: PRIVATE HEALTH INSURANCE | Admitting: Family Medicine

## 2016-07-15 VITALS — BP 108/72 | HR 83 | Temp 98.5°F | Ht 63.0 in | Wt 202.0 lb

## 2016-07-15 DIAGNOSIS — E8881 Metabolic syndrome: Secondary | ICD-10-CM

## 2016-07-15 DIAGNOSIS — Z6835 Body mass index (BMI) 35.0-35.9, adult: Secondary | ICD-10-CM

## 2016-07-15 DIAGNOSIS — E559 Vitamin D deficiency, unspecified: Secondary | ICD-10-CM | POA: Diagnosis not present

## 2016-07-15 DIAGNOSIS — E669 Obesity, unspecified: Secondary | ICD-10-CM | POA: Diagnosis not present

## 2016-07-15 MED ORDER — VITAMIN D (ERGOCALCIFEROL) 1.25 MG (50000 UNIT) PO CAPS: 50000.0000 [IU] | ORAL_CAPSULE | ORAL | 0 refills | Status: DC

## 2016-07-15 NOTE — Progress Notes (Signed)
Office: [phone]  /  Fax: [phone]   HPI:   Chief Complaint: OBESITY Kristin Rasmussen is here to discuss her progress with her obesity treatment plan. She is on the  follow the Category 2 plan and is following her eating plan approximately 100 % of the time. She states she is exercising 0 minutes 0 times per week. Kristin Rasmussen continues to do well with weight loss. She has been incorporating variety to her meals. Hunger and cravings are controlled. Her weight is 202 lb (91.6 kg) today and has had a weight loss of 1 pound over a period of 2 weeks since her last visit. She has lost 10 lbs since starting treatment with [place].  Vitamin D deficiency Kristin Rasmussen has a diagnosis of vitamin D deficiency. She is currently taking vit D and denies nausea, vomiting or muscle weakness.  Insulin Resistance Kristin Rasmussen has a diagnosis of insulin resistance based on her elevated fasting insulin level >5. Although Kristin Rasmussen's blood glucose readings are still under good control, insulin resistance puts her at greater risk of metabolic syndrome and diabetes. She is not taking metformin currently and continues to work on diet and exercise to decrease risk of diabetes. Kristin Rasmussen denies polyphagia.  ALLERGIES: No Known Allergies  MEDICATIONS: Current Outpatient Prescriptions on File Prior to Visit  Medication Sig Dispense Refill  . acetaminophen (TYLENOL) 500 MG tablet Take 500 mg by mouth every 6 (six) hours as needed.    . ALPRAZolam (XANAX) 0.25 MG tablet Take 12.5 mg by mouth 2 (two) times daily.      . baclofen (LIORESAL) 10 MG tablet Take 10 mg by mouth 3 (three) times daily.    . Calcium Carbonate-Vitamin D (CALCIUM + D) 600-200 MG-UNIT TABS Take 1 tablet by mouth daily.      . cetirizine (ZYRTEC) 10 MG tablet Take 10 mg by mouth daily.    . diphenhydrAMINE (BENADRYL) 25 mg capsule Take 25 mg by mouth every 6 (six) hours as needed.    . docusate sodium (COLACE) 100 MG capsule Take 100 mg by mouth daily as needed for mild  constipation.    [name] escitalopram (LEXAPRO) 20 MG tablet Take 20 mg by mouth daily.    [name] esomeprazole (NEXIUM) 40 MG capsule TAKE ONE CAPSULE BY MOUTH ONE TIME DAILY 90 capsule 3  . estradiol (VIVELLE-DOT) 0.075 MG/24HR Apply 1 patch externally 2 times a week  5  . fish oil-omega-3 fatty acids 1000 MG capsule Take 1,200 mg by mouth daily.     . magnesium hydroxide (MILK OF MAGNESIA) 400 MG/5ML suspension Take by mouth daily as needed for mild constipation.    . Multiple Vitamin (MULTIVITAMIN) tablet Take 1 tablet by mouth daily.      . pravastatin (PRAVACHOL) 20 MG tablet Take 1 tablet (20 mg total) by mouth daily. 90 tablet 3  . tolterodine (DETROL LA) 4 MG 24 hr capsule Take 4 mg by mouth daily.  4   No current facility-administered medications on file prior to visit.     PAST MEDICAL HISTORY: Past Medical History:  Diagnosis Date  . Anemia 2004   HCT 34.7  . Anxiety    follows with psyc for same  . Back pain   . Basal cell cancer    x3; Dr Tonia Brooms  . Bladder leak   . Chest pain 06/2010   Costochondritis  . Diverticulosis   . Gallbladder problem   . GERD (gastroesophageal reflux disease)   . Hyperlipidemia   . IBS (irritable bowel  syndrome)   . Melanoma ([place]) 2006   x1  . PCOS (polycystic ovarian syndrome)   . Plantar fasciitis 08/13/2011   Left is significant and RT is mild by [place] criteria   . Spasm of esophagus   . Swelling   . Thrombocytopenia ([place]) 2002   133,000 platelets    PAST SURGICAL HISTORY: Past Surgical History:  Procedure Laterality Date  . CHOLECYSTECTOMY  2002   [facility]  . COLONOSCOPY    . MELANOMA EXCISION     LUE 2006  [facility]; Derm : Dr Crista Luria  . SHOULDER SURGERY     Left  . TONSILLECTOMY    . TOTAL ABDOMINAL HYSTERECTOMY  1991   abnormal PAP hx    SOCIAL HISTORY: Social History  Substance Use Topics  . Smoking status: Never Smoker  . Smokeless tobacco: Never Used  . Alcohol use Yes     Comment: rarely, only holiday's    FAMILY  HISTORY: Family History  Problem Relation Age of Onset  . Hypertension Father   . Colon polyps Father   . GER disease Father   . Skin cancer Father   . Ovarian cancer Paternal Aunt   . Diabetes Mother   . Hypertension Mother   . Obesity Mother   . Heart attack Maternal Grandmother 76  . Diabetes Maternal Grandmother   . Heart attack Maternal Grandfather 69  . Stroke Maternal Grandfather 70  . Colon cancer Paternal Grandfather        unsure age    ROS: Review of Systems  Constitutional: Positive for weight loss.  Gastrointestinal: Negative for nausea and vomiting.  Musculoskeletal:       Negative muscle weakness  Endo/Heme/Allergies:       Negative polyphagia    PHYSICAL EXAM: Blood pressure 108/72, pulse 83, temperature 98.5 F (36.9 C), temperature source Oral, height 5\' 3"  (1.6 m), weight 202 lb (91.6 kg), SpO2 96 %. Body mass index is 35.78 kg/m. Physical Exam  Constitutional: She is oriented to person, place, and time. She appears well-developed and well-nourished.  Cardiovascular: Normal rate.   Pulmonary/Chest: Effort normal.  Musculoskeletal: Normal range of motion.  Neurological: She is oriented to person, place, and time.  Skin: Skin is warm and dry.  Psychiatric: She has a normal mood and affect. Her behavior is normal.  Vitals reviewed.   RECENT LABS AND TESTS: BMET    Component Value Date/Time   NA 140 06/03/2016 1253   K 4.2 06/03/2016 1253   CL 95 (L) 06/03/2016 1253   CO2 29 06/03/2016 1253   GLUCOSE 87 06/03/2016 1253   GLUCOSE 96 12/04/2015 0935   BUN 8 06/03/2016 1253   CREATININE 0.48 (L) 06/03/2016 1253   CALCIUM 9.1 06/03/2016 1253   GFRNONAA 111 06/03/2016 1253   GFRAA 128 06/03/2016 1253   Lab Results  Component Value Date   HGBA1C 5.4 06/03/2016   HGBA1C 5.6 12/04/2015   Lab Results  Component Value Date   INSULIN 7.4 06/03/2016   CBC    Component Value Date/Time   WBC 7.6 06/03/2016 1253   WBC 8.1 12/04/2015 0935    RBC 4.93 06/03/2016 1253   RBC 5.52 (H) 12/04/2015 0935   HGB 14.6 06/03/2016 1253   HGB 14.2 03/29/2010 1506   HCT 43.3 06/03/2016 1253   HCT 41.3 03/29/2010 1506   PLT 225.0 12/04/2015 0935   PLT 221 03/29/2010 1506   MCV 88 06/03/2016 1253   MCV 88.4 03/29/2010 1506   MCH  29.6 06/03/2016 1253   MCH 29.8 06/28/2010 0355   MCHC 33.7 06/03/2016 1253   MCHC 33.9 12/04/2015 0935   RDW 14.5 06/03/2016 1253   RDW 13.6 03/29/2010 1506   LYMPHSABS 1.5 06/03/2016 1253   LYMPHSABS 1.6 03/29/2010 1506   MONOABS 0.5 12/04/2015 0935   MONOABS 0.4 03/29/2010 1506   EOSABS 0.2 06/03/2016 1253   BASOSABS 0.0 06/03/2016 1253   BASOSABS 0.1 03/29/2010 1506   Iron/TIBC/Ferritin/ %Sat    Component Value Date/Time   IRON 65 01/22/2009 0000   IRONPCTSAT 21.8 01/22/2009 0000   Lipid Panel     Component Value Date/Time   CHOL 215 (H) 06/03/2016 1253   TRIG 185 (H) 06/03/2016 1253   HDL 46 06/03/2016 1253   CHOLHDL 5 12/04/2015 0935   VLDL 38.8 12/04/2015 0935   LDLCALC 132 (H) 06/03/2016 1253   LDLDIRECT 156.9 01/20/2008 0000   Hepatic Function Panel     Component Value Date/Time   PROT 6.7 06/03/2016 1253   ALBUMIN 4.2 06/03/2016 1253   AST 15 06/03/2016 1253   ALT 20 06/03/2016 1253   ALKPHOS 52 06/03/2016 1253   BILITOT 0.3 06/03/2016 1253   BILIDIR 0.1 12/01/2012 1035   IBILI NOT CALCULATED 06/28/2010 0355      Component Value Date/Time   TSH 1.550 06/03/2016 1253   TSH 2.27 12/04/2015 0935   TSH 1.97 12/04/2014 0958    ASSESSMENT AND PLAN: Vitamin D deficiency - Plan: Vitamin D, Ergocalciferol, (DRISDOL) 50000 units CAPS capsule  Insulin resistance  Class 2 obesity without serious comorbidity with body mass index (BMI) of 35.0 to 35.9 in adult, unspecified obesity type  PLAN:  Vitamin D Deficiency Kristin Rasmussen was informed that low vitamin D levels contributes to fatigue and are associated with obesity, breast, and colon cancer. She agrees to continue to take  prescription Vit D @50 ,000 IU every week, we will refill for 1 month and will follow up for routine testing of vitamin D, at least 2-3 times per year. She was informed of the risk of over-replacement of vitamin D and agrees to not increase her dose unless he discusses this with [place] first. Kristin Rasmussen agrees to follow up with our clinic in 2 to 3 weeks.  Insulin Resistance Kristin Rasmussen will continue to work on weight loss, exercise, and decreasing simple carbohydrates in her diet to help decrease the risk of diabetes. She was informed that eating too many simple carbohydrates or too many calories at one sitting increases the likelihood of GI side effects. Kristin Rasmussen agreed to follow up with [place] as directed to monitor her progress.  Obesity Kristin Rasmussen is currently in the action stage of change. As such, her goal is to continue with weight loss efforts She has agreed to follow the Category 2 plan Kristin Rasmussen has been instructed to work up to a goal of 150 minutes of combined cardio and strengthening exercise per week for weight loss and overall health benefits. We discussed the following Behavioral Modification Strategies today: increasing lean protein intake and celebration eating strategies  Kristin Rasmussen has agreed to follow up with our clinic in 2 to 3 weeks. She was informed of the importance of frequent follow up visits to maximize her success with intensive lifestyle modifications for her multiple health conditions.  I, Doreene Nest, am acting as scribe for Dennard Nip, MD  I have reviewed the above documentation for accuracy and completeness, and I agree with the above. -Dennard Nip, MD  OBESITY BEHAVIORAL INTERVENTION VISIT  Today's visit was #  4 out of 22.  Starting weight: 212 lbs Starting date: 06/03/16 Today's weight : 202 lbs Today's date: 07/15/2016 Total lbs lost to date: 10 (Patients must lose 7 lbs in the first 6 months to continue with counseling)   ASK: We discussed the diagnosis of obesity with  Kristin Rasmussen today and Kristin Rasmussen agreed to give [place] permission to discuss obesity behavioral modification therapy today.  ASSESS: Kristin Rasmussen has the diagnosis of obesity and her BMI today is 35.9 Kristin Rasmussen is in the action stage of change   ADVISE: Kristin Rasmussen was educated on the multiple health risks of obesity as well as the benefit of weight loss to improve her health. She was advised of the need for long term treatment and the importance of lifestyle modifications.  AGREE: Multiple dietary modification options and treatment options were discussed and  Kristin Rasmussen agreed to follow the Category 2 plan We discussed the following Behavioral Modification Strategies today: increasing lean protein intake and celebration eating strategies

## 2016-08-04 ENCOUNTER — Ambulatory Visit (INDEPENDENT_AMBULATORY_CARE_PROVIDER_SITE_OTHER): Payer: PRIVATE HEALTH INSURANCE | Admitting: Family Medicine

## 2016-08-04 VITALS — BP 105/71 | HR 81 | Temp 97.6°F | Ht 63.0 in | Wt 198.0 lb

## 2016-08-04 DIAGNOSIS — IMO0001 Reserved for inherently not codable concepts without codable children: Secondary | ICD-10-CM

## 2016-08-04 DIAGNOSIS — E559 Vitamin D deficiency, unspecified: Secondary | ICD-10-CM | POA: Diagnosis not present

## 2016-08-04 DIAGNOSIS — Z6835 Body mass index (BMI) 35.0-35.9, adult: Secondary | ICD-10-CM

## 2016-08-04 DIAGNOSIS — E669 Obesity, unspecified: Secondary | ICD-10-CM

## 2016-08-04 MED ORDER — VITAMIN D (ERGOCALCIFEROL) 1.25 MG (50000 UNIT) PO CAPS: 50000.0000 [IU] | ORAL_CAPSULE | ORAL | 0 refills | Status: DC

## 2016-08-04 NOTE — Progress Notes (Signed)
Office: [phone]  /  Fax: [phone]   HPI:   Chief Complaint: OBESITY Kristin Rasmussen is here to discuss her progress with her obesity treatment plan. She is on the  follow the Category 2 plan and is following her eating plan approximately 99 % of the time. She states she is exercising 0 minutes 0 times per week. Pam continues to do well with weight loss. She is struggling with some social eating but is doing well overall. Her weight is 198 lb (89.8 kg) today and has had a weight loss of 4 pounds over a period of 3 weeks since her last visit. She has lost 14 lbs since starting treatment with [place].  Vitamin D deficiency [name] has a diagnosis of vitamin D deficiency. She is currently stable on vit D and denies nausea, vomiting or muscle weakness.   ALLERGIES: No Known Allergies  MEDICATIONS: Current Outpatient Prescriptions on File Prior to Visit  Medication Sig Dispense Refill  . acetaminophen (TYLENOL) 500 MG tablet Take 500 mg by mouth every 6 (six) hours as needed.    . ALPRAZolam (XANAX) 0.25 MG tablet Take 12.5 mg by mouth 2 (two) times daily.      . baclofen (LIORESAL) 10 MG tablet Take 10 mg by mouth 3 (three) times daily.    . Calcium Carbonate-Vitamin D (CALCIUM + D) 600-200 MG-UNIT TABS Take 1 tablet by mouth daily.      . cetirizine (ZYRTEC) 10 MG tablet Take 10 mg by mouth daily.    . diphenhydrAMINE (BENADRYL) 25 mg capsule Take 25 mg by mouth every 6 (six) hours as needed.    . docusate sodium (COLACE) 100 MG capsule Take 100 mg by mouth daily as needed for mild constipation.    [name] escitalopram (LEXAPRO) 20 MG tablet Take 20 mg by mouth daily.    [name] esomeprazole (NEXIUM) 40 MG capsule TAKE ONE CAPSULE BY MOUTH ONE TIME DAILY 90 capsule 3  . estradiol (VIVELLE-DOT) 0.075 MG/24HR Apply 1 patch externally 2 times a week  5  . fish oil-omega-3 fatty acids 1000 MG capsule Take 1,200 mg by mouth daily.     . magnesium hydroxide (MILK OF MAGNESIA) 400 MG/5ML suspension Take by  mouth daily as needed for mild constipation.    . Multiple Vitamin (MULTIVITAMIN) tablet Take 1 tablet by mouth daily.      . pravastatin (PRAVACHOL) 20 MG tablet Take 1 tablet (20 mg total) by mouth daily. 90 tablet 3  . tolterodine (DETROL LA) 4 MG 24 hr capsule Take 4 mg by mouth daily.  4   No current facility-administered medications on file prior to visit.     PAST MEDICAL HISTORY: Past Medical History:  Diagnosis Date  . Anemia 2004   HCT 34.7  . Anxiety    follows with psyc for same  . Back pain   . Basal cell cancer    x3; Dr Tonia Rasmussen  . Bladder leak   . Chest pain 06/2010   Costochondritis  . Diverticulosis   . Gallbladder problem   . GERD (gastroesophageal reflux disease)   . Hyperlipidemia   . IBS (irritable bowel syndrome)   . Melanoma ([place]) 2006   x1  . PCOS (polycystic ovarian syndrome)   . Plantar fasciitis 08/13/2011   Left is significant and RT is mild by [place] criteria   . Spasm of esophagus   . Swelling   . Thrombocytopenia ([place]) 2002   133,000 platelets    PAST SURGICAL HISTORY: Past  Surgical History:  Procedure Laterality Date  . CHOLECYSTECTOMY  2002   [facility]  . COLONOSCOPY    . MELANOMA EXCISION     LUE 2006  [facility]; Derm : Dr Kristin Rasmussen  . SHOULDER SURGERY     Left  . TONSILLECTOMY    . TOTAL ABDOMINAL HYSTERECTOMY  1991   abnormal PAP hx    SOCIAL HISTORY: Social History  Substance Use Topics  . Smoking status: Never Smoker  . Smokeless tobacco: Never Used  . Alcohol use Yes     Comment: rarely, only holiday's    FAMILY HISTORY: Family History  Problem Relation Age of Onset  . Hypertension Father   . Colon polyps Father   . GER disease Father   . Skin cancer Father   . Ovarian cancer Paternal Aunt   . Diabetes Mother   . Hypertension Mother   . Obesity Mother   . Heart attack Maternal Grandmother 76  . Diabetes Maternal Grandmother   . Heart attack Maternal Grandfather 69  . Stroke Maternal Grandfather 58  . Colon cancer  Paternal Grandfather        unsure age    ROS: Review of Systems  Constitutional: Positive for weight loss.  Gastrointestinal: Negative for nausea and vomiting.  Musculoskeletal:       Negative muscle weakness    PHYSICAL EXAM: Blood pressure 105/71, pulse 81, temperature 97.6 F (36.4 C), temperature source Oral, height 5\' 3"  (1.6 m), weight 198 lb (89.8 kg), SpO2 97 %. Body mass index is 35.07 kg/m. Physical Exam  Constitutional: She is oriented to person, place, and time. She appears well-developed and well-nourished.  Cardiovascular: Normal rate.   Pulmonary/Chest: Effort normal.  Musculoskeletal: Normal range of motion.  Neurological: She is oriented to person, place, and time.  Skin: Skin is warm and dry.  Psychiatric: She has a normal mood and affect. Her behavior is normal.  Vitals reviewed.   RECENT LABS AND TESTS: BMET    Component Value Date/Time   NA 140 06/03/2016 1253   K 4.2 06/03/2016 1253   CL 95 (L) 06/03/2016 1253   CO2 29 06/03/2016 1253   GLUCOSE 87 06/03/2016 1253   GLUCOSE 96 12/04/2015 0935   BUN 8 06/03/2016 1253   CREATININE 0.48 (L) 06/03/2016 1253   CALCIUM 9.1 06/03/2016 1253   GFRNONAA 111 06/03/2016 1253   GFRAA 128 06/03/2016 1253   Lab Results  Component Value Date   HGBA1C 5.4 06/03/2016   HGBA1C 5.6 12/04/2015   Lab Results  Component Value Date   INSULIN 7.4 06/03/2016   CBC    Component Value Date/Time   WBC 7.6 06/03/2016 1253   WBC 8.1 12/04/2015 0935   RBC 4.93 06/03/2016 1253   RBC 5.52 (H) 12/04/2015 0935   HGB 14.6 06/03/2016 1253   HGB 14.2 03/29/2010 1506   HCT 43.3 06/03/2016 1253   HCT 41.3 03/29/2010 1506   PLT 225.0 12/04/2015 0935   PLT 221 03/29/2010 1506   MCV 88 06/03/2016 1253   MCV 88.4 03/29/2010 1506   MCH 29.6 06/03/2016 1253   MCH 29.8 06/28/2010 0355   MCHC 33.7 06/03/2016 1253   MCHC 33.9 12/04/2015 0935   RDW 14.5 06/03/2016 1253   RDW 13.6 03/29/2010 1506   LYMPHSABS 1.5 06/03/2016  1253   LYMPHSABS 1.6 03/29/2010 1506   MONOABS 0.5 12/04/2015 0935   MONOABS 0.4 03/29/2010 1506   EOSABS 0.2 06/03/2016 1253   BASOSABS 0.0 06/03/2016 1253  BASOSABS 0.1 03/29/2010 1506   Iron/TIBC/Ferritin/ %Sat    Component Value Date/Time   IRON 65 01/22/2009 0000   IRONPCTSAT 21.8 01/22/2009 0000   Lipid Panel     Component Value Date/Time   CHOL 215 (H) 06/03/2016 1253   TRIG 185 (H) 06/03/2016 1253   HDL 46 06/03/2016 1253   CHOLHDL 5 12/04/2015 0935   VLDL 38.8 12/04/2015 0935   LDLCALC 132 (H) 06/03/2016 1253   LDLDIRECT 156.9 01/20/2008 0000   Hepatic Function Panel     Component Value Date/Time   PROT 6.7 06/03/2016 1253   ALBUMIN 4.2 06/03/2016 1253   AST 15 06/03/2016 1253   ALT 20 06/03/2016 1253   ALKPHOS 52 06/03/2016 1253   BILITOT 0.3 06/03/2016 1253   BILIDIR 0.1 12/01/2012 1035   IBILI NOT CALCULATED 06/28/2010 0355      Component Value Date/Time   TSH 1.550 06/03/2016 1253   TSH 2.27 12/04/2015 0935   TSH 1.97 12/04/2014 0958    ASSESSMENT AND PLAN: Vitamin D deficiency - Plan: Vitamin D, Ergocalciferol, (DRISDOL) 50000 units CAPS capsule  Class 2 obesity with serious comorbidity and body mass index (BMI) of 35.0 to 35.9 in adult, unspecified obesity type  PLAN:  Vitamin D Deficiency Riana was informed that low vitamin D levels contributes to fatigue and are associated with obesity, breast, and colon cancer. She agrees to continue to take prescription Vit D @50 ,000 IU every week, we will refill for 1 month and will follow up for routine testing of vitamin D, at least 2-3 times per year. She was informed of the risk of over-replacement of vitamin D and agrees to not increase her dose unless he discusses this with [place] first. Chloe agrees to follow up with our clinic in 2 to 3 weeks.  Obesity Charlina is currently in the action stage of change. As such, her goal is to continue with weight loss efforts She has agreed to follow the Category 2  plan Lizmary has been instructed to work up to a goal of 150 minutes of combined cardio and strengthening exercise per week for weight loss and overall health benefits. We discussed the following Behavioral Modification Strategies today: better snacking choices, travel eating strategies, increasing lean protein intake and decreasing simple carbohydrates   Cierah has agreed to follow up with our clinic in 2 to 3 weeks. She was informed of the importance of frequent follow up visits to maximize her success with intensive lifestyle modifications for her multiple health conditions.  I, Doreene Nest, am acting as transcriptionist for Dennard Nip, MD  I have reviewed the above documentation for accuracy and completeness, and I agree with the above. -Dennard Nip, MD  OBESITY BEHAVIORAL INTERVENTION VISIT  Today's visit was # 5 out of 22.  Starting weight: 212 lbs Starting date: 06/03/16 Today's weight : 198 lbs Today's date: 08/04/2016 Total lbs lost to date: 14 (Patients must lose 7 lbs in the first 6 months to continue with counseling)   ASK: We discussed the diagnosis of obesity with Ellin Goodie today and Noe agreed to give [place] permission to discuss obesity behavioral modification therapy today.  ASSESS: Alany has the diagnosis of obesity and her BMI today is 35.1 Dustin is in the action stage of change   ADVISE: Rossetta was educated on the multiple health risks of obesity as well as the benefit of weight loss to improve her health. She was advised of the need for long term treatment and the importance  of lifestyle modifications.  AGREE: Multiple dietary modification options and treatment options were discussed and  Suzannah agreed to follow the Category 2 plan We discussed the following Behavioral Modification Strategies today: better snacking choices, travel eating strategies, increasing lean protein intake and decreasing simple carbohydrates

## 2016-08-25 ENCOUNTER — Ambulatory Visit (INDEPENDENT_AMBULATORY_CARE_PROVIDER_SITE_OTHER): Payer: PRIVATE HEALTH INSURANCE | Admitting: Family Medicine

## 2016-08-25 VITALS — BP 108/71 | HR 71 | Temp 97.9°F

## 2016-08-25 DIAGNOSIS — I10 Essential (primary) hypertension: Secondary | ICD-10-CM

## 2016-08-25 DIAGNOSIS — E669 Obesity, unspecified: Secondary | ICD-10-CM

## 2016-08-25 DIAGNOSIS — Z6835 Body mass index (BMI) 35.0-35.9, adult: Secondary | ICD-10-CM

## 2016-08-25 DIAGNOSIS — IMO0001 Reserved for inherently not codable concepts without codable children: Secondary | ICD-10-CM

## 2016-08-25 DIAGNOSIS — E559 Vitamin D deficiency, unspecified: Secondary | ICD-10-CM

## 2016-08-25 MED ORDER — VITAMIN D (ERGOCALCIFEROL) 1.25 MG (50000 UNIT) PO CAPS: 50000.0000 [IU] | ORAL_CAPSULE | ORAL | 0 refills | Status: DC

## 2016-08-26 NOTE — Progress Notes (Signed)
Office: [phone]  /  Fax: [phone]   HPI:   Chief Complaint: OBESITY Kristin Rasmussen is here to discuss her progress with her obesity treatment plan. She is on the  follow the Category 2 plan and is following her eating plan approximately 97 % of the time. She states she is exercising 0 minutes 0 times per week. Kristin Rasmussen continues to do well with weight loss and hunger is mostly controlled. She is getting ready to go on vacation and has questions about how to eat healthy while traveling. Her weight is (P) 197 lb (89.4 kg) today and has had a weight loss of 1 pound over a period of 2 weeks since her last visit. She has lost 15 lbs since starting treatment with [place].  Vitamin D deficiency Kristin Rasmussen has a diagnosis of vitamin D deficiency. She is currently stable on vit D, not yet at goal and she denies nausea, vomiting or muscle weakness.  Hypertension Kristin Rasmussen is a 56 y.o. female with hypertension. She is not on medications and [place] FEE denies chest pain, headache or lightheadedness. She is working weight loss to help control her blood Rasmussen with the goal of decreasing her risk of heart attack and stroke. Kristin Rasmussen is currently well  Controlled and improved with weight loss..   ALLERGIES: No Known Allergies  MEDICATIONS: Current Outpatient Prescriptions on File Prior to Visit  Medication Sig Dispense Refill  . acetaminophen (TYLENOL) 500 MG tablet Take 500 mg by mouth every 6 (six) hours as needed.    . ALPRAZolam (XANAX) 0.25 MG tablet Take 12.5 mg by mouth 2 (two) times daily.      . baclofen (LIORESAL) 10 MG tablet Take 10 mg by mouth 3 (three) times daily.    . Calcium Carbonate-Vitamin D (CALCIUM + D) 600-200 MG-UNIT TABS Take 1 tablet by mouth daily.      . cetirizine (ZYRTEC) 10 MG tablet Take 10 mg by mouth daily.    . diphenhydrAMINE (BENADRYL) 25 mg capsule Take 25 mg by mouth every 6 (six) hours as needed.    . docusate sodium (COLACE) 100 MG capsule  Take 100 mg by mouth daily as needed for mild constipation.    [name] escitalopram (LEXAPRO) 20 MG tablet Take 20 mg by mouth daily.    [name] esomeprazole (NEXIUM) 40 MG capsule TAKE ONE CAPSULE BY MOUTH ONE TIME DAILY 90 capsule 3  . estradiol (VIVELLE-DOT) 0.075 MG/24HR Apply 1 patch externally 2 times a week  5  . fish oil-omega-3 fatty acids 1000 MG capsule Take 1,200 mg by mouth daily.     . magnesium hydroxide (MILK OF MAGNESIA) 400 MG/5ML suspension Take by mouth daily as needed for mild constipation.    . Multiple Vitamin (MULTIVITAMIN) tablet Take 1 tablet by mouth daily.      . pravastatin (PRAVACHOL) 20 MG tablet Take 1 tablet (20 mg total) by mouth daily. 90 tablet 3  . tolterodine (DETROL LA) 4 MG 24 hr capsule Take 4 mg by mouth daily.  4   No current facility-administered medications on file prior to visit.     PAST MEDICAL HISTORY: Past Medical History:  Diagnosis Date  . Anemia 2004   HCT 34.7  . Anxiety    follows with psyc for same  . Back pain   . Basal cell cancer    x3; Dr Tonia Brooms  . Bladder leak   . Chest pain 06/2010   Costochondritis  . Diverticulosis   .  Gallbladder problem   . GERD (gastroesophageal reflux disease)   . Hyperlipidemia   . IBS (irritable bowel syndrome)   . Melanoma ([place]) 2006   x1  . PCOS (polycystic ovarian syndrome)   . Plantar fasciitis 08/13/2011   Left is significant and RT is mild by [place] criteria   . Spasm of esophagus   . Swelling   . Thrombocytopenia ([place]) 2002   133,000 platelets    PAST SURGICAL HISTORY: Past Surgical History:  Procedure Laterality Date  . CHOLECYSTECTOMY  2002   [facility]  . COLONOSCOPY    . MELANOMA EXCISION     LUE 2006  [facility]; Derm : Dr Crista Luria  . SHOULDER SURGERY     Left  . TONSILLECTOMY    . TOTAL ABDOMINAL HYSTERECTOMY  1991   abnormal PAP hx    SOCIAL HISTORY: Social History  Substance Use Topics  . Smoking status: Never Smoker  . Smokeless tobacco: Never Used  . Alcohol use Yes     Comment:  rarely, only holiday's    FAMILY HISTORY: Family History  Problem Relation Age of Onset  . Hypertension Father   . Colon polyps Father   . GER disease Father   . Skin cancer Father   . Ovarian cancer Paternal Aunt   . Diabetes Mother   . Hypertension Mother   . Obesity Mother   . Heart attack Maternal Grandmother 76  . Diabetes Maternal Grandmother   . Heart attack Maternal Grandfather 69  . Stroke Maternal Grandfather 55  . Colon cancer Paternal Grandfather        unsure age    ROS: Review of Systems  Constitutional: Positive for weight loss.  Cardiovascular: Negative for chest pain.  Gastrointestinal: Negative for nausea and vomiting.  Musculoskeletal:       Negative muscle weakness  Neurological: Negative for headaches.       Negative lightheadedness    PHYSICAL EXAM: Blood Rasmussen 108/71, pulse 71, temperature 97.9 F (36.6 C), temperature source Oral, height (P) 5\' 3"  (1.6 m), weight (P) 197 lb (89.4 kg), SpO2 99 %. Body mass index is 34.9 kg/m (pended). Physical Exam  Constitutional: She is oriented to person, place, and time. She appears well-developed and well-nourished.  Cardiovascular: Normal rate.   Pulmonary/Chest: Effort normal.  Musculoskeletal: Normal range of motion.  Neurological: She is oriented to person, place, and time.  Skin: Skin is warm and dry.  Psychiatric: She has a normal mood and affect. Her behavior is normal.  Vitals reviewed.   RECENT LABS AND TESTS: BMET    Component Value Date/Time   NA 140 06/03/2016 1253   K 4.2 06/03/2016 1253   CL 95 (L) 06/03/2016 1253   CO2 29 06/03/2016 1253   GLUCOSE 87 06/03/2016 1253   GLUCOSE 96 12/04/2015 0935   BUN 8 06/03/2016 1253   CREATININE 0.48 (L) 06/03/2016 1253   CALCIUM 9.1 06/03/2016 1253   GFRNONAA 111 06/03/2016 1253   GFRAA 128 06/03/2016 1253   Lab Results  Component Value Date   HGBA1C 5.4 06/03/2016   HGBA1C 5.6 12/04/2015   Lab Results  Component Value Date    INSULIN 7.4 06/03/2016   CBC    Component Value Date/Time   WBC 7.6 06/03/2016 1253   WBC 8.1 12/04/2015 0935   RBC 4.93 06/03/2016 1253   RBC 5.52 (H) 12/04/2015 0935   HGB 14.6 06/03/2016 1253   HGB 14.2 03/29/2010 1506   HCT 43.3 06/03/2016 1253  HCT 41.3 03/29/2010 1506   PLT 225.0 12/04/2015 0935   PLT 221 03/29/2010 1506   MCV 88 06/03/2016 1253   MCV 88.4 03/29/2010 1506   MCH 29.6 06/03/2016 1253   MCH 29.8 06/28/2010 0355   MCHC 33.7 06/03/2016 1253   MCHC 33.9 12/04/2015 0935   RDW 14.5 06/03/2016 1253   RDW 13.6 03/29/2010 1506   LYMPHSABS 1.5 06/03/2016 1253   LYMPHSABS 1.6 03/29/2010 1506   MONOABS 0.5 12/04/2015 0935   MONOABS 0.4 03/29/2010 1506   EOSABS 0.2 06/03/2016 1253   BASOSABS 0.0 06/03/2016 1253   BASOSABS 0.1 03/29/2010 1506   Iron/TIBC/Ferritin/ %Sat    Component Value Date/Time   IRON 65 01/22/2009 0000   IRONPCTSAT 21.8 01/22/2009 0000   Lipid Panel     Component Value Date/Time   CHOL 215 (H) 06/03/2016 1253   TRIG 185 (H) 06/03/2016 1253   HDL 46 06/03/2016 1253   CHOLHDL 5 12/04/2015 0935   VLDL 38.8 12/04/2015 0935   LDLCALC 132 (H) 06/03/2016 1253   LDLDIRECT 156.9 01/20/2008 0000   Hepatic Function Panel     Component Value Date/Time   PROT 6.7 06/03/2016 1253   ALBUMIN 4.2 06/03/2016 1253   AST 15 06/03/2016 1253   ALT 20 06/03/2016 1253   ALKPHOS 52 06/03/2016 1253   BILITOT 0.3 06/03/2016 1253   BILIDIR 0.1 12/01/2012 1035   IBILI NOT CALCULATED 06/28/2010 0355      Component Value Date/Time   TSH 1.550 06/03/2016 1253   TSH 2.27 12/04/2015 0935   TSH 1.97 12/04/2014 0958    ASSESSMENT AND PLAN: Vitamin D deficiency - Plan: Vitamin D, Ergocalciferol, (DRISDOL) 50000 units CAPS capsule  Essential hypertension  Class 2 obesity with serious comorbidity and body mass index (BMI) of 35.0 to 35.9 in adult, unspecified obesity type  PLAN:  Vitamin D Deficiency Kristin Rasmussen was informed that low vitamin D levels  contributes to fatigue and are associated with obesity, breast, and colon cancer. She agrees to continue to take prescription Vit D @50 ,000 IU every week, we will refill for 1 month and will follow up for routine testing of vitamin D, at least 2-3 times per year. She was informed of the risk of over-replacement of vitamin D and agrees to not increase her dose unless he discusses this with [place] first. Kristin Rasmussen agrees to follow up with our clinic in 2 weeks.  Hypertension We discussed sodium restriction, working on healthy weight loss, and a regular exercise program as the means to achieve improved blood Rasmussen control. Kristin Rasmussen agreed with this plan and agreed to follow up as directed. We will continue to monitor her blood Rasmussen as well as her progress with the above lifestyle modifications. She will increase her H2O intake and will watch for signs of hypotension as she continues her lifestyle modifications.  Obesity Kristin Rasmussen is currently in the action stage of change. As such, her goal is to continue with weight loss efforts She has agreed to follow the Category 2 plan Kristin Rasmussen has been instructed to work up to a goal of 150 minutes of combined cardio and strengthening exercise per week for weight loss and overall health benefits. We discussed the following Behavioral Modification Strategies today: increasing vegetables and travel eating strategies   Kristin Rasmussen has agreed to follow up with our clinic in 2 weeks. She was informed of the importance of frequent follow up visits to maximize her success with intensive lifestyle modifications for her multiple health conditions.  Kristin Rasmussen  Valere Dross, am acting as Location manager for Dennard Nip, MD  I have reviewed the above documentation for accuracy and completeness, and I agree with the above. -Dennard Nip, MD   OBESITY BEHAVIORAL INTERVENTION VISIT  Today's visit was # 6 out of 22.  Starting weight: 212 lbs Starting date: 06/03/16 Today's weight : 197  lbs Today's date: 08/25/2016 Total lbs lost to date: 15 (Patients must lose 7 lbs in the first 6 months to continue with counseling)   ASK: We discussed the diagnosis of obesity with Kristin Rasmussen today and Kristin Rasmussen agreed to give [place] permission to discuss obesity behavioral modification therapy today.  ASSESS: Kristin Rasmussen has the diagnosis of obesity and her BMI today is 106 Kristin Rasmussen is in the action stage of change   ADVISE: Kristin Rasmussen was educated on the multiple health risks of obesity as well as the benefit of weight loss to improve her health. She was advised of the need for long term treatment and the importance of lifestyle modifications.  AGREE: Multiple dietary modification options and treatment options were discussed and  Kristin Rasmussen agreed to follow the Category 2 plan We discussed the following Behavioral Modification Strategies today: increasing vegetables and travel eating strategies

## 2016-09-15 ENCOUNTER — Ambulatory Visit (INDEPENDENT_AMBULATORY_CARE_PROVIDER_SITE_OTHER): Payer: PRIVATE HEALTH INSURANCE | Admitting: Family Medicine

## 2016-09-15 VITALS — BP 109/70 | HR 74 | Temp 98.1°F | Ht 63.0 in | Wt 196.0 lb

## 2016-09-15 DIAGNOSIS — E669 Obesity, unspecified: Secondary | ICD-10-CM | POA: Diagnosis not present

## 2016-09-15 DIAGNOSIS — Z9189 Other specified personal risk factors, not elsewhere classified: Secondary | ICD-10-CM

## 2016-09-15 DIAGNOSIS — Z6834 Body mass index (BMI) 34.0-34.9, adult: Secondary | ICD-10-CM

## 2016-09-15 DIAGNOSIS — E559 Vitamin D deficiency, unspecified: Secondary | ICD-10-CM | POA: Diagnosis not present

## 2016-09-15 MED ORDER — VITAMIN D (ERGOCALCIFEROL) 1.25 MG (50000 UNIT) PO CAPS: 50000.0000 [IU] | ORAL_CAPSULE | ORAL | 0 refills | Status: DC

## 2016-09-15 NOTE — Progress Notes (Signed)
Office: [phone]  /  Fax: [phone]   HPI:   Chief Complaint: OBESITY Era is here to discuss her progress with her obesity treatment plan. She is on the  follow the Category 2 plan and is following her eating plan approximately 90 % of the time. She states she is exercising 0 minutes 0 times per week. Jennae continues to do well with weight loss. Hunger is mostly controlled but she did have more problems with sabotage. She went on vacation and tried to make better choices. Her weight is 196 lb (88.9 kg) today and has had a weight loss of 1 pound over a period of 3 weeks since her last visit. She has lost 16 lbs since starting treatment with [place].  Vitamin D deficiency Chenae has a diagnosis of vitamin D deficiency. She is currently stable on vit D. Fatigue is improving and she denies nausea, vomiting or muscle weakness.  At risk for osteopenia Makylie is at higher risk of osteopenia and osteoporosis due to vitamin D deficiency.   ALLERGIES: No Known Allergies  MEDICATIONS: Current Outpatient Prescriptions on File Prior to Visit  Medication Sig Dispense Refill  . acetaminophen (TYLENOL) 500 MG tablet Take 500 mg by mouth every 6 (six) hours as needed.    . ALPRAZolam (XANAX) 0.25 MG tablet Take 12.5 mg by mouth 2 (two) times daily.      . baclofen (LIORESAL) 10 MG tablet Take 10 mg by mouth 3 (three) times daily.    . Calcium Carbonate-Vitamin D (CALCIUM + D) 600-200 MG-UNIT TABS Take 1 tablet by mouth daily.      . cetirizine (ZYRTEC) 10 MG tablet Take 10 mg by mouth daily.    . diphenhydrAMINE (BENADRYL) 25 mg capsule Take 25 mg by mouth every 6 (six) hours as needed.    . docusate sodium (COLACE) 100 MG capsule Take 100 mg by mouth daily as needed for mild constipation.    [name] escitalopram (LEXAPRO) 20 MG tablet Take 20 mg by mouth daily.    [name] esomeprazole (NEXIUM) 40 MG capsule TAKE ONE CAPSULE BY MOUTH ONE TIME DAILY 90 capsule 3  . estradiol (VIVELLE-DOT) 0.075 MG/24HR  Apply 1 patch externally 2 times a week  5  . fish oil-omega-3 fatty acids 1000 MG capsule Take 1,200 mg by mouth daily.     . magnesium hydroxide (MILK OF MAGNESIA) 400 MG/5ML suspension Take by mouth daily as needed for mild constipation.    . Multiple Vitamin (MULTIVITAMIN) tablet Take 1 tablet by mouth daily.      . pravastatin (PRAVACHOL) 20 MG tablet Take 1 tablet (20 mg total) by mouth daily. 90 tablet 3  . tolterodine (DETROL LA) 4 MG 24 hr capsule Take 4 mg by mouth daily.  4  . Vitamin D, Ergocalciferol, (DRISDOL) 50000 units CAPS capsule Take 1 capsule (50,000 Units total) by mouth every 7 (seven) days. 4 capsule 0   No current facility-administered medications on file prior to visit.     PAST MEDICAL HISTORY: Past Medical History:  Diagnosis Date  . Anemia 2004   HCT 34.7  . Anxiety    follows with psyc for same  . Back pain   . Basal cell cancer    x3; Dr Tonia Brooms  . Bladder leak   . Chest pain 06/2010   Costochondritis  . Diverticulosis   . Gallbladder problem   . GERD (gastroesophageal reflux disease)   . Hyperlipidemia   . IBS (irritable bowel syndrome)   .  Melanoma ([place]) 2006   x1  . PCOS (polycystic ovarian syndrome)   . Plantar fasciitis 08/13/2011   Left is significant and RT is mild by [place] criteria   . Spasm of esophagus   . Swelling   . Thrombocytopenia ([place]) 2002   133,000 platelets    PAST SURGICAL HISTORY: Past Surgical History:  Procedure Laterality Date  . CHOLECYSTECTOMY  2002   [facility]  . COLONOSCOPY    . MELANOMA EXCISION     LUE 2006  [facility]; Derm : Dr Crista Luria  . SHOULDER SURGERY     Left  . TONSILLECTOMY    . TOTAL ABDOMINAL HYSTERECTOMY  1991   abnormal PAP hx    SOCIAL HISTORY: Social History  Substance Use Topics  . Smoking status: Never Smoker  . Smokeless tobacco: Never Used  . Alcohol use Yes     Comment: rarely, only holiday's    FAMILY HISTORY: Family History  Problem Relation Age of Onset  . Hypertension Father   .  Colon polyps Father   . GER disease Father   . Skin cancer Father   . Ovarian cancer Paternal Aunt   . Diabetes Mother   . Hypertension Mother   . Obesity Mother   . Heart attack Maternal Grandmother 76  . Diabetes Maternal Grandmother   . Heart attack Maternal Grandfather 69  . Stroke Maternal Grandfather 86  . Colon cancer Paternal Grandfather        unsure age    ROS: Review of Systems  Constitutional: Positive for malaise/fatigue and weight loss.  Gastrointestinal: Negative for nausea and vomiting.  Musculoskeletal:       Negative muscle weakness    PHYSICAL EXAM: Blood pressure 109/70, pulse 74, temperature 98.1 F (36.7 C), temperature source Oral, height 5\' 3"  (1.6 m), weight 196 lb (88.9 kg), SpO2 98 %. Body mass index is 34.72 kg/m. Physical Exam  Constitutional: She is oriented to person, place, and time. She appears well-developed and well-nourished.  Cardiovascular: Normal rate.   Pulmonary/Chest: Effort normal.  Musculoskeletal: Normal range of motion.  Neurological: She is oriented to person, place, and time.  Skin: Skin is warm and dry.  Psychiatric: She has a normal mood and affect. Her behavior is normal.  Vitals reviewed.   RECENT LABS AND TESTS: BMET    Component Value Date/Time   NA 140 06/03/2016 1253   K 4.2 06/03/2016 1253   CL 95 (L) 06/03/2016 1253   CO2 29 06/03/2016 1253   GLUCOSE 87 06/03/2016 1253   GLUCOSE 96 12/04/2015 0935   BUN 8 06/03/2016 1253   CREATININE 0.48 (L) 06/03/2016 1253   CALCIUM 9.1 06/03/2016 1253   GFRNONAA 111 06/03/2016 1253   GFRAA 128 06/03/2016 1253   Lab Results  Component Value Date   HGBA1C 5.4 06/03/2016   HGBA1C 5.6 12/04/2015   Lab Results  Component Value Date   INSULIN 7.4 06/03/2016   CBC    Component Value Date/Time   WBC 7.6 06/03/2016 1253   WBC 8.1 12/04/2015 0935   RBC 4.93 06/03/2016 1253   RBC 5.52 (H) 12/04/2015 0935   HGB 14.6 06/03/2016 1253   HGB 14.2 03/29/2010 1506    HCT 43.3 06/03/2016 1253   HCT 41.3 03/29/2010 1506   PLT 225.0 12/04/2015 0935   PLT 221 03/29/2010 1506   MCV 88 06/03/2016 1253   MCV 88.4 03/29/2010 1506   MCH 29.6 06/03/2016 1253   MCH 29.8 06/28/2010 0355   MCHC  33.7 06/03/2016 1253   MCHC 33.9 12/04/2015 0935   RDW 14.5 06/03/2016 1253   RDW 13.6 03/29/2010 1506   LYMPHSABS 1.5 06/03/2016 1253   LYMPHSABS 1.6 03/29/2010 1506   MONOABS 0.5 12/04/2015 0935   MONOABS 0.4 03/29/2010 1506   EOSABS 0.2 06/03/2016 1253   BASOSABS 0.0 06/03/2016 1253   BASOSABS 0.1 03/29/2010 1506   Iron/TIBC/Ferritin/ %Sat    Component Value Date/Time   IRON 65 01/22/2009 0000   IRONPCTSAT 21.8 01/22/2009 0000   Lipid Panel     Component Value Date/Time   CHOL 215 (H) 06/03/2016 1253   TRIG 185 (H) 06/03/2016 1253   HDL 46 06/03/2016 1253   CHOLHDL 5 12/04/2015 0935   VLDL 38.8 12/04/2015 0935   LDLCALC 132 (H) 06/03/2016 1253   LDLDIRECT 156.9 01/20/2008 0000   Hepatic Function Panel     Component Value Date/Time   PROT 6.7 06/03/2016 1253   ALBUMIN 4.2 06/03/2016 1253   AST 15 06/03/2016 1253   ALT 20 06/03/2016 1253   ALKPHOS 52 06/03/2016 1253   BILITOT 0.3 06/03/2016 1253   BILIDIR 0.1 12/01/2012 1035   IBILI NOT CALCULATED 06/28/2010 0355      Component Value Date/Time   TSH 1.550 06/03/2016 1253   TSH 2.27 12/04/2015 0935   TSH 1.97 12/04/2014 0958    ASSESSMENT AND PLAN: Vitamin D deficiency - Plan: Vitamin D, Ergocalciferol, (DRISDOL) 50000 units CAPS capsule  At risk for osteoporosis  Class 1 obesity without serious comorbidity with body mass index (BMI) of 34.0 to 34.9 in adult, unspecified obesity type  PLAN:  Vitamin D Deficiency Izzie was informed that low vitamin D levels contributes to fatigue and are associated with obesity, breast, and colon cancer. She agrees to continue to take prescription Vit D @50 ,000 IU every week, we will refill for 1 month and will re-check labs in 3 months and will follow  up for routine testing of vitamin D, at least 2-3 times per year. She was informed of the risk of over-replacement of vitamin D and agrees to not increase her dose unless he discusses this with [place] first. Kleo agrees to follow up with our clinic in 2 to 3 weeks.  At risk for osteopenia Danni is at risk for osteopenia and osteoporosis due to her vitamin D deficiency. She was encouraged to take her vitamin D and follow her higher calcium diet and increase strengthening exercise to help strengthen her bones and decrease her risk of osteopenia and osteoporosis.  Obesity Willeen is currently in the action stage of change. As such, her goal is to continue with weight loss efforts She has agreed to follow the Category 2 plan Dennys has been instructed to work up to a goal of 150 minutes of combined cardio and strengthening exercise per week or start walking 3 flights of stairs at work daily for weight loss and overall health benefits. We discussed the following Behavioral Modification Strategies today: meal planning & cooking strategies, travel eating strategies, increasing lean protein intake and decreasing simple carbohydrates   Shahrzad has agreed to follow up with our clinic in 2 to 3 weeks. She was informed of the importance of frequent follow up visits to maximize her success with intensive lifestyle modifications for her multiple health conditions.  I, Doreene Nest, am acting as transcriptionist for Dennard Nip, MD  I have reviewed the above documentation for accuracy and completeness, and I agree with the above. -Dennard Nip, MD   OBESITY BEHAVIORAL INTERVENTION  VISIT  Today's visit was # 7 out of 22.  Starting weight: 212 lbs Starting date: 06/03/16 Today's weight : 196 lbs Today's date: 09/15/2016 Total lbs lost to date: 16 (Patients must lose 7 lbs in the first 6 months to continue with counseling)   ASK: We discussed the diagnosis of obesity with Ellin Goodie today and Latessa  agreed to give [place] permission to discuss obesity behavioral modification therapy today.  ASSESS: Kaziah has the diagnosis of obesity and her BMI today is 34.8 Lindy is in the action stage of change   ADVISE: Ladaisha was educated on the multiple health risks of obesity as well as the benefit of weight loss to improve her health. She was advised of the need for long term treatment and the importance of lifestyle modifications.  AGREE: Multiple dietary modification options and treatment options were discussed and  Syrah agreed to follow the Category 2 plan We discussed the following Behavioral Modification Strategies today: meal planning & cooking strategies, travel eating strategies, increasing lean protein intake and decreasing simple carbohydrates

## 2016-10-08 ENCOUNTER — Ambulatory Visit (INDEPENDENT_AMBULATORY_CARE_PROVIDER_SITE_OTHER): Payer: PRIVATE HEALTH INSURANCE | Admitting: Physician Assistant

## 2016-10-08 VITALS — BP 91/56 | HR 87 | Temp 97.9°F | Ht 63.0 in | Wt 191.0 lb

## 2016-10-08 DIAGNOSIS — E8881 Metabolic syndrome: Secondary | ICD-10-CM | POA: Insufficient documentation

## 2016-10-08 DIAGNOSIS — Z6834 Body mass index (BMI) 34.0-34.9, adult: Secondary | ICD-10-CM | POA: Diagnosis not present

## 2016-10-08 DIAGNOSIS — E559 Vitamin D deficiency, unspecified: Secondary | ICD-10-CM

## 2016-10-08 DIAGNOSIS — E669 Obesity, unspecified: Secondary | ICD-10-CM

## 2016-10-08 DIAGNOSIS — Z9189 Other specified personal risk factors, not elsewhere classified: Secondary | ICD-10-CM

## 2016-10-08 DIAGNOSIS — E7849 Other hyperlipidemia: Secondary | ICD-10-CM

## 2016-10-08 DIAGNOSIS — E784 Other hyperlipidemia: Secondary | ICD-10-CM

## 2016-10-08 NOTE — Progress Notes (Signed)
Office: [phone]  /  Fax: [phone]   HPI:   Chief Complaint: OBESITY Kristin Rasmussen is here to discuss her progress with her obesity treatment plan. She is on the Category 2 plan and is following her eating plan approximately 90 % of the time. She states she is exercising stairs 10 minutes 2 times per week. Kristin Rasmussen continues to do well with weight loss despite celebration eating. She made smarter food choices and controlled her portions.  Her weight is 191 lb (86.6 kg) today and has had a weight loss of 5 pounds over a period of 3 weeks since her last visit. She has lost 21 lbs since starting treatment with [place].  Insulin Resistance Kristin Rasmussen has a diagnosis of insulin resistance based on her elevated fasting insulin level >5. Although Kristin Rasmussen's blood glucose readings are still under good control, insulin resistance puts her at greater risk of metabolic syndrome and diabetes. She is not taking metformin currently and continues to work on diet and exercise to decrease risk of diabetes.  At risk for diabetes Kristin Rasmussen is at higher than average risk for developing diabetes due to her obesity and insulin resistance. She currently denies polyuria or polydipsia.  Vitamin D deficiency Kristin Rasmussen has a diagnosis of vitamin D deficiency. She is currently taking prescription Vit D and denies nausea, vomiting or muscle weakness.  Hyperlipidemia Kristin Rasmussen has hyperlipidemia and has been trying to improve her cholesterol levels with intensive lifestyle modification including a low saturated fat diet, exercise and weight loss. Kristin Rasmussen is on statin and she denies any chest pain, dyspnea, claudication or myalgias.  ALLERGIES: No Known Allergies  MEDICATIONS: Current Outpatient Prescriptions on File Prior to Visit  Medication Sig Dispense Refill  . acetaminophen (TYLENOL) 500 MG tablet Take 500 mg by mouth every 6 (six) hours as needed.    . ALPRAZolam (XANAX) 0.25 MG tablet Take 12.5 mg by mouth 2 (two) times daily.       . baclofen (LIORESAL) 10 MG tablet Take 10 mg by mouth 3 (three) times daily.    . cetirizine (ZYRTEC) 10 MG tablet Take 10 mg by mouth daily.    . diphenhydrAMINE (BENADRYL) 25 mg capsule Take 25 mg by mouth every 6 (six) hours as needed.    . docusate sodium (COLACE) 100 MG capsule Take 100 mg by mouth daily as needed for mild constipation.    [name] escitalopram (LEXAPRO) 20 MG tablet Take 20 mg by mouth daily.    [name] esomeprazole (NEXIUM) 40 MG capsule TAKE ONE CAPSULE BY MOUTH ONE TIME DAILY 90 capsule 3  . estradiol (VIVELLE-DOT) 0.075 MG/24HR Apply 1 patch externally 2 times a week  5  . fish oil-omega-3 fatty acids 1000 MG capsule Take 1,200 mg by mouth daily.     . magnesium hydroxide (MILK OF MAGNESIA) 400 MG/5ML suspension Take by mouth daily as needed for mild constipation.    . Multiple Vitamin (MULTIVITAMIN) tablet Take 1 tablet by mouth daily.      . pravastatin (PRAVACHOL) 20 MG tablet Take 1 tablet (20 mg total) by mouth daily. 90 tablet 3  . tolterodine (DETROL LA) 4 MG 24 hr capsule Take 4 mg by mouth daily.  4  . Vitamin D, Ergocalciferol, (DRISDOL) 50000 units CAPS capsule Take 1 capsule (50,000 Units total) by mouth every 7 (seven) days. 4 capsule 0   No current facility-administered medications on file prior to visit.     PAST MEDICAL HISTORY: Past Medical History:  Diagnosis Date  . Anemia  2004   HCT 34.7  . Anxiety    follows with psyc for same  . Back pain   . Basal cell cancer    x3; Dr Tonia Brooms  . Bladder leak   . Chest pain 06/2010   Costochondritis  . Diverticulosis   . Gallbladder problem   . GERD (gastroesophageal reflux disease)   . Hyperlipidemia   . IBS (irritable bowel syndrome)   . Melanoma ([place]) 2006   x1  . PCOS (polycystic ovarian syndrome)   . Plantar fasciitis 08/13/2011   Left is significant and RT is mild by [place] criteria   . Spasm of esophagus   . Swelling   . Thrombocytopenia ([place]) 2002   133,000 platelets    PAST SURGICAL  HISTORY: Past Surgical History:  Procedure Laterality Date  . CHOLECYSTECTOMY  2002   [facility]  . COLONOSCOPY    . MELANOMA EXCISION     LUE 2006  [facility]; Derm : Dr Crista Luria  . SHOULDER SURGERY     Left  . TONSILLECTOMY    . TOTAL ABDOMINAL HYSTERECTOMY  1991   abnormal PAP hx    SOCIAL HISTORY: Social History  Substance Use Topics  . Smoking status: Never Smoker  . Smokeless tobacco: Never Used  . Alcohol use Yes     Comment: rarely, only holiday's    FAMILY HISTORY: Family History  Problem Relation Age of Onset  . Hypertension Father   . Colon polyps Father   . GER disease Father   . Skin cancer Father   . Ovarian cancer Paternal Aunt   . Diabetes Mother   . Hypertension Mother   . Obesity Mother   . Heart attack Maternal Grandmother 76  . Diabetes Maternal Grandmother   . Heart attack Maternal Grandfather 69  . Stroke Maternal Grandfather 37  . Colon cancer Paternal Grandfather        unsure age    ROS: Review of Systems  Constitutional: Positive for weight loss.  Cardiovascular: Negative for chest pain and claudication.  Gastrointestinal: Negative for nausea and vomiting.  Genitourinary: Negative for frequency.  Musculoskeletal: Negative for myalgias.       Negative muscle weakness  Endo/Heme/Allergies: Negative for polydipsia.    PHYSICAL EXAM: Blood pressure (!) 91/56, pulse 87, temperature 97.9 F (36.6 C), temperature source Oral, height 5\' 3"  (1.6 m), weight 191 lb (86.6 kg), SpO2 98 %. Body mass index is 33.83 kg/m. Physical Exam  Constitutional: She is oriented to person, place, and time. She appears well-developed and well-nourished.  Cardiovascular: Normal rate.   Pulmonary/Chest: Effort normal.  Musculoskeletal: Normal range of motion.  Neurological: She is oriented to person, place, and time.  Skin: Skin is warm and dry.  Psychiatric: She has a normal mood and affect. Her behavior is normal.  Vitals reviewed.   RECENT LABS AND  TESTS: BMET    Component Value Date/Time   NA 140 06/03/2016 1253   K 4.2 06/03/2016 1253   CL 95 (L) 06/03/2016 1253   CO2 29 06/03/2016 1253   GLUCOSE 87 06/03/2016 1253   GLUCOSE 96 12/04/2015 0935   BUN 8 06/03/2016 1253   CREATININE 0.48 (L) 06/03/2016 1253   CALCIUM 9.1 06/03/2016 1253   GFRNONAA 111 06/03/2016 1253   GFRAA 128 06/03/2016 1253   Lab Results  Component Value Date   HGBA1C 5.4 06/03/2016   HGBA1C 5.6 12/04/2015   Lab Results  Component Value Date   INSULIN 7.4 06/03/2016   CBC  Component Value Date/Time   WBC 7.6 06/03/2016 1253   WBC 8.1 12/04/2015 0935   RBC 4.93 06/03/2016 1253   RBC 5.52 (H) 12/04/2015 0935   HGB 14.6 06/03/2016 1253   HGB 14.2 03/29/2010 1506   HCT 43.3 06/03/2016 1253   HCT 41.3 03/29/2010 1506   PLT 225.0 12/04/2015 0935   PLT 221 03/29/2010 1506   MCV 88 06/03/2016 1253   MCV 88.4 03/29/2010 1506   MCH 29.6 06/03/2016 1253   MCH 29.8 06/28/2010 0355   MCHC 33.7 06/03/2016 1253   MCHC 33.9 12/04/2015 0935   RDW 14.5 06/03/2016 1253   RDW 13.6 03/29/2010 1506   LYMPHSABS 1.5 06/03/2016 1253   LYMPHSABS 1.6 03/29/2010 1506   MONOABS 0.5 12/04/2015 0935   MONOABS 0.4 03/29/2010 1506   EOSABS 0.2 06/03/2016 1253   BASOSABS 0.0 06/03/2016 1253   BASOSABS 0.1 03/29/2010 1506   Iron/TIBC/Ferritin/ %Sat    Component Value Date/Time   IRON 65 01/22/2009 0000   IRONPCTSAT 21.8 01/22/2009 0000   Lipid Panel     Component Value Date/Time   CHOL 215 (H) 06/03/2016 1253   TRIG 185 (H) 06/03/2016 1253   HDL 46 06/03/2016 1253   CHOLHDL 5 12/04/2015 0935   VLDL 38.8 12/04/2015 0935   LDLCALC 132 (H) 06/03/2016 1253   LDLDIRECT 156.9 01/20/2008 0000   Hepatic Function Panel     Component Value Date/Time   PROT 6.7 06/03/2016 1253   ALBUMIN 4.2 06/03/2016 1253   AST 15 06/03/2016 1253   ALT 20 06/03/2016 1253   ALKPHOS 52 06/03/2016 1253   BILITOT 0.3 06/03/2016 1253   BILIDIR 0.1 12/01/2012 1035   IBILI NOT  CALCULATED 06/28/2010 0355      Component Value Date/Time   TSH 1.550 06/03/2016 1253   TSH 2.27 12/04/2015 0935   TSH 1.97 12/04/2014 0958    ASSESSMENT AND PLAN: Insulin resistance - Plan: Hemoglobin A1c, Insulin, random  Vitamin D deficiency - Plan: VITAMIN D 25 Hydroxy (Vit-D Deficiency, Fractures)  Other hyperlipidemia - Plan: Comprehensive metabolic panel, Lipid Panel With LDL/HDL Ratio  At risk for diabetes mellitus  Class 1 obesity with serious comorbidity and body mass index (BMI) of 34.0 to 34.9 in adult, unspecified obesity type  PLAN:  Insulin Resistance Beyonka will continue to work on weight loss, exercise, and decreasing simple carbohydrates in her diet to help decrease the risk of diabetes. We dicussed metformin including benefits and risks. She was informed that eating too many simple carbohydrates or too many calories at one sitting increases the likelihood of GI side effects. Mallerie is not taking metformin for now and prescription was not written today. We will check labs during today's visit. Kristin Rasmussen agrees to continue diet and exercise and follow up with our clinic in 3 weeks as directed to monitor her progress.  Vitamin D Deficiency Kristin Rasmussen was informed that low vitamin D levels contributes to fatigue and are associated with obesity, breast, and colon cancer. She agrees to continue taking prescription Vit D @50 ,000 IU every week and will follow up for routine testing of vitamin D, at least 2-3 times per year. She was informed of the risk of over-replacement of vitamin D and agrees to not increase her dose unless he discusses this with [place] first. We will check labs during today's visit. Kristin Rasmussen agrees to follow up with our clinic in 3 weeks.  Hyperlipidemia Kristin Rasmussen was informed of the American Heart Association Guidelines emphasizing intensive lifestyle modifications as the first  line treatment for hyperlipidemia. We discussed many lifestyle modifications today in depth,  and Kristin Rasmussen will continue to work on decreasing saturated fats such as fatty red meat, butter and many fried foods. She will also increase vegetables and lean protein in her diet and continue to work on exercise and weight loss efforts. We will check labs during today's visit. Kristin Rasmussen agrees to continue medications and will follow up with our clinic in 3 weeks.  Diabetes risk counselling Kristin Rasmussen was given extended (15 minutes) diabetes prevention counseling today. She is 56 y.o. female and has risk factors for diabetes including obesity and insulin resistance. We discussed intensive lifestyle modifications today with an emphasis on weight loss as well as increasing exercise and decreasing simple carbohydrates in her diet.  Obesity Kristin Rasmussen is currently in the action stage of change. As such, her goal is to continue with weight loss efforts She has agreed to follow the Category 2 plan Kristin Rasmussen has been instructed to work up to a goal of 150 minutes of combined cardio and strengthening exercise per week for weight loss and overall health benefits. We discussed the following Behavioral Modification Strategies today: increasing lean protein intake and work on meal planning and easy cooking plans   Kristin Rasmussen has agreed to follow up with our clinic in 3 weeks. She was informed of the importance of frequent follow up visits to maximize her success with intensive lifestyle modifications for her multiple health conditions.  I, Kristin Rasmussen, am acting as transcriptionist for Kristin Duverney, PA-C  I have reviewed the above documentation for accuracy and completeness, and I agree with the above. -Kristin Duverney, PA-C  I have reviewed the above note and agree with the plan. -Kristin Nip, MD   OBESITY BEHAVIORAL INTERVENTION VISIT  Today's visit was # 8 out of 22.  Starting weight: 212 lbs Starting date: 06/03/16 Today's weight: 191 lbs   Today's date: 10/08/16 Total lbs lost to date: 21 (Patients must lose 7 lbs in  the first 6 months to continue with counseling)   ASK: We discussed the diagnosis of obesity with Kristin Rasmussen today and Kristin Rasmussen agreed to give [place] permission to discuss obesity behavioral modification therapy today.  ASSESS: Kristin Rasmussen has the diagnosis of obesity and her BMI today is 36 Otis is in the action stage of change   ADVISE: Laurelyn was educated on the multiple health risks of obesity as well as the benefit of weight loss to improve her health. She was advised of the need for long term treatment and the importance of lifestyle modifications.  AGREE: Multiple dietary modification options and treatment options were discussed and  Ellerie agreed to follow the Category 2 plan We discussed the following Behavioral Modification Strategies today: increasing lean protein intake and work on meal planning and easy cooking plans

## 2016-10-09 LAB — INSULIN, RANDOM: INSULIN: 5.1 u[IU]/mL (ref 2.6–24.9)

## 2016-10-09 LAB — COMPREHENSIVE METABOLIC PANEL
A/G RATIO: 1.8 (ref 1.2–2.2)
ALT: 11 IU/L (ref 0–32)
AST: 12 IU/L (ref 0–40)
Albumin: 4.4 g/dL (ref 3.5–5.5)
Alkaline Phosphatase: 54 IU/L (ref 39–117)
BUN/Creatinine Ratio: 20 (ref 9–23)
BUN: 11 mg/dL (ref 6–24)
Bilirubin Total: 0.3 mg/dL (ref 0.0–1.2)
CALCIUM: 9.5 mg/dL (ref 8.7–10.2)
CO2: 27 mmol/L (ref 20–29)
Chloride: 99 mmol/L (ref 96–106)
Creatinine, Ser: 0.55 mg/dL — ABNORMAL LOW (ref 0.57–1.00)
GFR, EST AFRICAN AMERICAN: 121 mL/min/{1.73_m2} (ref >59)
GFR, EST NON AFRICAN AMERICAN: 105 mL/min/{1.73_m2} (ref >59)
GLUCOSE: 89 mg/dL (ref 65–99)
Globulin, Total: 2.4 g/dL (ref 1.5–4.5)
POTASSIUM: 4.4 mmol/L (ref 3.5–5.2)
Sodium: 141 mmol/L (ref 134–144)
Total Protein: 6.8 g/dL (ref 6.0–8.5)

## 2016-10-09 LAB — LIPID PANEL WITH LDL/HDL RATIO
CHOLESTEROL TOTAL: 168 mg/dL (ref 100–199)
HDL: 42 mg/dL (ref >39)
LDL Calculated: 103 mg/dL — ABNORMAL HIGH (ref 0–99)
LDl/HDL Ratio: 2.5 ratio (ref 0.0–3.2)
Triglycerides: 117 mg/dL (ref 0–149)
VLDL Cholesterol Cal: 23 mg/dL (ref 5–40)

## 2016-10-09 LAB — VITAMIN D 25 HYDROXY (VIT D DEFICIENCY, FRACTURES): VIT D 25 HYDROXY: 73.3 ng/mL (ref 30.0–100.0)

## 2016-10-09 LAB — HEMOGLOBIN A1C
ESTIMATED AVERAGE GLUCOSE: 108 mg/dL
Hgb A1c MFr Bld: 5.4 % (ref 4.8–5.6)

## 2016-10-10 ENCOUNTER — Telehealth: Payer: Self-pay | Admitting: Internal Medicine

## 2016-10-10 MED ORDER — ESOMEPRAZOLE MAGNESIUM 40 MG PO CPDR: DELAYED_RELEASE_CAPSULE | ORAL | 0 refills | Status: DC

## 2016-10-10 MED ORDER — PRAVASTATIN SODIUM 20 MG PO TABS: 20.0000 mg | ORAL_TABLET | Freq: Every day | ORAL | 0 refills | Status: DC

## 2016-10-10 NOTE — Telephone Encounter (Signed)
Pharmacy called requesting a refill on the pt's pravastatin (PRAVACHOL) 20 MG tablet and esomeprazole (NEXIUM) 40 MG capsule to be sent to [facility].

## 2016-10-10 NOTE — Telephone Encounter (Signed)
Reviewed chart pt is due for annual appt in Oct. Sent 30 day script until appt...Kristin Rasmussen

## 2016-10-28 ENCOUNTER — Ambulatory Visit (INDEPENDENT_AMBULATORY_CARE_PROVIDER_SITE_OTHER): Payer: PRIVATE HEALTH INSURANCE | Admitting: Physician Assistant

## 2016-10-28 VITALS — BP 102/68 | HR 76 | Temp 97.9°F | Ht 63.0 in | Wt 191.0 lb

## 2016-10-28 DIAGNOSIS — E669 Obesity, unspecified: Secondary | ICD-10-CM

## 2016-10-28 DIAGNOSIS — E559 Vitamin D deficiency, unspecified: Secondary | ICD-10-CM

## 2016-10-28 DIAGNOSIS — Z6834 Body mass index (BMI) 34.0-34.9, adult: Secondary | ICD-10-CM

## 2016-10-29 NOTE — Progress Notes (Signed)
Office: [phone]  /  Fax: [phone]   HPI:   Chief Complaint: OBESITY Kristin Rasmussen is here to discuss her progress with her obesity treatment plan. She is on the  follow the Category 2 plan and is following her eating plan approximately 95 % of the time. She states she is exercising 0 minutes 0 times per week. Kristin Rasmussen maintained her weight and she has been following the plan. Kristin Rasmussen would like more options at dinner. Her weight is 191 lb (86.6 kg) today and has maintained weight over a period of 3 weeks since her last visit. She has lost 21 lbs since starting treatment with [place].  Vitamin D deficiency Kristin Rasmussen has a diagnosis of vitamin D deficiency. Her vitamin D level is now at goal with multi vitamin and she denies nausea, vomiting or muscle weakness.   ALLERGIES: No Known Allergies  MEDICATIONS: Current Outpatient Prescriptions on File Prior to Visit  Medication Sig Dispense Refill  . acetaminophen (TYLENOL) 500 MG tablet Take 500 mg by mouth every 6 (six) hours as needed.    . ALPRAZolam (XANAX) 0.25 MG tablet Take 12.5 mg by mouth 2 (two) times daily.      . baclofen (LIORESAL) 10 MG tablet Take 10 mg by mouth 3 (three) times daily.    . calcium citrate-vitamin D (CITRACAL+D) 315-200 MG-UNIT tablet Take 1 tablet by mouth 2 (two) times daily.    . cetirizine (ZYRTEC) 10 MG tablet Take 10 mg by mouth daily.    . diphenhydrAMINE (BENADRYL) 25 mg capsule Take 25 mg by mouth every 6 (six) hours as needed.    . docusate sodium (COLACE) 100 MG capsule Take 100 mg by mouth daily as needed for mild constipation.    [name] escitalopram (LEXAPRO) 20 MG tablet Take 20 mg by mouth daily.    [name] esomeprazole (NEXIUM) 40 MG capsule TAKE ONE CAPSULE BY MOUTH ONE TIME DAILY 30 capsule 0  . estradiol (VIVELLE-DOT) 0.075 MG/24HR Apply 1 patch externally 2 times a week  5  . fish oil-omega-3 fatty acids 1000 MG capsule Take 1,200 mg by mouth daily.     . magnesium hydroxide (MILK OF MAGNESIA) 400 MG/5ML  suspension Take by mouth daily as needed for mild constipation.    . Multiple Vitamin (MULTIVITAMIN) tablet Take 1 tablet by mouth daily.      . pravastatin (PRAVACHOL) 20 MG tablet Take 1 tablet (20 mg total) by mouth daily. Annual appt due in Oct must see provider for future refills 30 tablet 0  . tolterodine (DETROL LA) 4 MG 24 hr capsule Take 4 mg by mouth daily.  4   No current facility-administered medications on file prior to visit.     PAST MEDICAL HISTORY: Past Medical History:  Diagnosis Date  . Anemia 2004   HCT 34.7  . Anxiety    follows with psyc for same  . Back pain   . Basal cell cancer    x3; Dr Tonia Brooms  . Bladder leak   . Chest pain 06/2010   Costochondritis  . Diverticulosis   . Gallbladder problem   . GERD (gastroesophageal reflux disease)   . Hyperlipidemia   . IBS (irritable bowel syndrome)   . Melanoma ([place]) 2006   x1  . PCOS (polycystic ovarian syndrome)   . Plantar fasciitis 08/13/2011   Left is significant and RT is mild by [place] criteria   . Spasm of esophagus   . Swelling   . Thrombocytopenia ([place]) 2002   133,000  platelets    PAST SURGICAL HISTORY: Past Surgical History:  Procedure Laterality Date  . CHOLECYSTECTOMY  2002   [facility]  . COLONOSCOPY    . MELANOMA EXCISION     LUE 2006  [facility]; Derm : Dr Crista Luria  . SHOULDER SURGERY     Left  . TONSILLECTOMY    . TOTAL ABDOMINAL HYSTERECTOMY  1991   abnormal PAP hx    SOCIAL HISTORY: Social History  Substance Use Topics  . Smoking status: Never Smoker  . Smokeless tobacco: Never Used  . Alcohol use Yes     Comment: rarely, only holiday's    FAMILY HISTORY: Family History  Problem Relation Age of Onset  . Hypertension Father   . Colon polyps Father   . GER disease Father   . Skin cancer Father   . Ovarian cancer Paternal Aunt   . Diabetes Mother   . Hypertension Mother   . Obesity Mother   . Heart attack Maternal Grandmother 76  . Diabetes Maternal Grandmother   . Heart attack  Maternal Grandfather 69  . Stroke Maternal Grandfather 63  . Colon cancer Paternal Grandfather        unsure age    ROS: Review of Systems  Constitutional: Negative for weight loss.  Gastrointestinal: Negative for nausea and vomiting.  Musculoskeletal:       Negative muscle weakness    PHYSICAL EXAM: Blood pressure 102/68, pulse 76, temperature 97.9 F (36.6 C), temperature source Oral, height 5\' 3"  (1.6 m), weight 191 lb (86.6 kg), SpO2 97 %. Body mass index is 33.83 kg/m. Physical Exam  Constitutional: She is oriented to person, place, and time. She appears well-developed and well-nourished.  Cardiovascular: Normal rate.   Pulmonary/Chest: Effort normal.  Musculoskeletal: Normal range of motion.  Neurological: She is oriented to person, place, and time.  Skin: Skin is warm and dry.  Psychiatric: She has a normal mood and affect. Her behavior is normal.  Vitals reviewed.   RECENT LABS AND TESTS: BMET    Component Value Date/Time   NA 141 10/08/2016 0941   K 4.4 10/08/2016 0941   CL 99 10/08/2016 0941   CO2 27 10/08/2016 0941   GLUCOSE 89 10/08/2016 0941   GLUCOSE 96 12/04/2015 0935   BUN 11 10/08/2016 0941   CREATININE 0.55 (L) 10/08/2016 0941   CALCIUM 9.5 10/08/2016 0941   GFRNONAA 105 10/08/2016 0941   GFRAA 121 10/08/2016 0941   Lab Results  Component Value Date   HGBA1C 5.4 10/08/2016   HGBA1C 5.4 06/03/2016   HGBA1C 5.6 12/04/2015   Lab Results  Component Value Date   INSULIN 5.1 10/08/2016   INSULIN 7.4 06/03/2016   CBC    Component Value Date/Time   WBC 7.6 06/03/2016 1253   WBC 8.1 12/04/2015 0935   RBC 4.93 06/03/2016 1253   RBC 5.52 (H) 12/04/2015 0935   HGB 14.6 06/03/2016 1253   HGB 14.2 03/29/2010 1506   HCT 43.3 06/03/2016 1253   HCT 41.3 03/29/2010 1506   PLT 225.0 12/04/2015 0935   PLT 221 03/29/2010 1506   MCV 88 06/03/2016 1253   MCV 88.4 03/29/2010 1506   MCH 29.6 06/03/2016 1253   MCH 29.8 06/28/2010 0355   MCHC 33.7  06/03/2016 1253   MCHC 33.9 12/04/2015 0935   RDW 14.5 06/03/2016 1253   RDW 13.6 03/29/2010 1506   LYMPHSABS 1.5 06/03/2016 1253   LYMPHSABS 1.6 03/29/2010 1506   MONOABS 0.5 12/04/2015 0935   MONOABS  0.4 03/29/2010 1506   EOSABS 0.2 06/03/2016 1253   BASOSABS 0.0 06/03/2016 1253   BASOSABS 0.1 03/29/2010 1506   Iron/TIBC/Ferritin/ %Sat    Component Value Date/Time   IRON 65 01/22/2009 0000   IRONPCTSAT 21.8 01/22/2009 0000   Lipid Panel     Component Value Date/Time   CHOL 168 10/08/2016 0941   TRIG 117 10/08/2016 0941   HDL 42 10/08/2016 0941   CHOLHDL 5 12/04/2015 0935   VLDL 38.8 12/04/2015 0935   LDLCALC 103 (H) 10/08/2016 0941   LDLDIRECT 156.9 01/20/2008 0000   Hepatic Function Panel     Component Value Date/Time   PROT 6.8 10/08/2016 0941   ALBUMIN 4.4 10/08/2016 0941   AST 12 10/08/2016 0941   ALT 11 10/08/2016 0941   ALKPHOS 54 10/08/2016 0941   BILITOT 0.3 10/08/2016 0941   BILIDIR 0.1 12/01/2012 1035   IBILI NOT CALCULATED 06/28/2010 0355      Component Value Date/Time   TSH 1.550 06/03/2016 1253   TSH 2.27 12/04/2015 0935   TSH 1.97 12/04/2014 0958    ASSESSMENT AND PLAN: Vitamin D deficiency  Class 1 obesity with serious comorbidity and body mass index (BMI) of 34.0 to 34.9 in adult, unspecified obesity type  PLAN:  Vitamin D Deficiency Soumya was informed that low vitamin D levels contributes to fatigue and are associated with obesity, breast, and colon cancer. She agrees to stop taking prescription Vit D @50 ,000 IU every week and will follow up for routine testing of vitamin D, at least 2-3 times per year.  We spent > than 50% of the 15 minute visit on the counseling as documented in the note.   Obesity Zeynab is currently in the action stage of change. As such, her goal is to continue with weight loss efforts She has agreed to keep a food journal with 500 calories and 40+ grams of protein at supper daily and follow the Category 2  plan Myrissa has been instructed to work up to a goal of 150 minutes of combined cardio and strengthening exercise per week for weight loss and overall health benefits. We discussed the following Behavioral Modification Strategies today: increasing lean protein intake and keeping healthy foods in the home.  Aziah has agreed to follow up with our clinic in 4 weeks. She was informed of the importance of frequent follow up visits to maximize her success with intensive lifestyle modifications for her multiple health conditions.  I, Doreene Nest, am acting as transcriptionist for Lacy Duverney, PA-C  I have reviewed the above documentation for accuracy and completeness, and I agree with the above. -Lacy Duverney, PA-C  I have reviewed the above note and agree with the plan. -Dennard Nip, MD   OBESITY BEHAVIORAL INTERVENTION VISIT  Today's visit was # 9 out of 22.  Starting weight: 212 lbs Starting date: 06/03/16 Today's weight : 191 lbs  Today's date: 10/28/2016 Total lbs lost to date: 21 (Patients must lose 7 lbs in the first 6 months to continue with counseling)   ASK: We discussed the diagnosis of obesity with Ellin Goodie today and Chera agreed to give [place] permission to discuss obesity behavioral modification therapy today.  ASSESS: Kanijah has the diagnosis of obesity and her BMI today is 33.84 Rachyl is in the action stage of change   ADVISE: Ahmyah was educated on the multiple health risks of obesity as well as the benefit of weight loss to improve her health. She was advised of the need  for long term treatment and the importance of lifestyle modifications.  AGREE: Multiple dietary modification options and treatment options were discussed and  Carleena agreed to keep a food journal with 500 calories and 40+ grams of protein at supper daily and follow the Category 2 plan We discussed the following Behavioral Modification Strategies today: increasing lean protein intake and keeping  healthy foods in the home.

## 2016-11-04 LAB — HM MAMMOGRAPHY

## 2016-11-12 ENCOUNTER — Encounter: Payer: Self-pay | Admitting: Internal Medicine

## 2016-11-19 ENCOUNTER — Ambulatory Visit (INDEPENDENT_AMBULATORY_CARE_PROVIDER_SITE_OTHER): Payer: PRIVATE HEALTH INSURANCE | Admitting: Family Medicine

## 2016-11-27 ENCOUNTER — Ambulatory Visit (INDEPENDENT_AMBULATORY_CARE_PROVIDER_SITE_OTHER): Payer: PRIVATE HEALTH INSURANCE | Admitting: Family Medicine

## 2016-11-27 VITALS — BP 107/71 | HR 72 | Temp 97.9°F | Ht 63.0 in | Wt 189.0 lb

## 2016-11-27 DIAGNOSIS — E669 Obesity, unspecified: Secondary | ICD-10-CM

## 2016-11-27 DIAGNOSIS — Z6833 Body mass index (BMI) 33.0-33.9, adult: Secondary | ICD-10-CM

## 2016-11-27 DIAGNOSIS — E559 Vitamin D deficiency, unspecified: Secondary | ICD-10-CM | POA: Diagnosis not present

## 2016-11-27 NOTE — Progress Notes (Signed)
Office: [phone]  /  Fax: [phone]   HPI:   Chief Complaint: OBESITY Kristin Rasmussen is here to discuss her progress with her obesity treatment plan. She is on the  keep a food journal with 500 calories and 40+ grams of protein at supper daily and follow the Category 2 plan and is following her eating plan approximately 90 % of the time. She states she is exercising 0 minutes 0 times per week. Kristin Rasmussen continues to do well with weight loss on the Category 2 plan. She had increased temptations but was able to portion control.  Her weight is 189 lb (85.7 kg) today and has had a weight loss of 2 pounds over a period of 4 weeks since her last visit. She has lost 23 lbs since starting treatment with [place].  Vitamin D deficiency Kristin Rasmussen has a diagnosis of vitamin D deficiency. She is now off prescription Vit D due to meeting her Vit D goal. Her fatigue improved and she denies nausea, vomiting or muscle weakness.  ALLERGIES: No Known Allergies  MEDICATIONS: Current Outpatient Prescriptions on File Prior to Visit  Medication Sig Dispense Refill  . acetaminophen (TYLENOL) 500 MG tablet Take 500 mg by mouth every 6 (six) hours as needed.    . ALPRAZolam (XANAX) 0.25 MG tablet Take 12.5 mg by mouth 2 (two) times daily.      . baclofen (LIORESAL) 10 MG tablet Take 10 mg by mouth 3 (three) times daily.    . calcium citrate-vitamin D (CITRACAL+D) 315-200 MG-UNIT tablet Take 1 tablet by mouth 2 (two) times daily.    . cetirizine (ZYRTEC) 10 MG tablet Take 10 mg by mouth daily.    . diphenhydrAMINE (BENADRYL) 25 mg capsule Take 25 mg by mouth every 6 (six) hours as needed.    . docusate sodium (COLACE) 100 MG capsule Take 100 mg by mouth daily as needed for mild constipation.    [name] escitalopram (LEXAPRO) 20 MG tablet Take 20 mg by mouth daily.    [name] esomeprazole (NEXIUM) 40 MG capsule TAKE ONE CAPSULE BY MOUTH ONE TIME DAILY 30 capsule 0  . estradiol (VIVELLE-DOT) 0.075 MG/24HR Apply 1 patch externally 2  times a week  5  . fish oil-omega-3 fatty acids 1000 MG capsule Take 1,200 mg by mouth daily.     . magnesium hydroxide (MILK OF MAGNESIA) 400 MG/5ML suspension Take by mouth daily as needed for mild constipation.    . Multiple Vitamin (MULTIVITAMIN) tablet Take 1 tablet by mouth daily.      . pravastatin (PRAVACHOL) 20 MG tablet Take 1 tablet (20 mg total) by mouth daily. Annual appt due in Oct must see provider for future refills 30 tablet 0   No current facility-administered medications on file prior to visit.     PAST MEDICAL HISTORY: Past Medical History:  Diagnosis Date  . Anemia 2004   HCT 34.7  . Anxiety    follows with psyc for same  . Back pain   . Basal cell cancer    x3; Dr Tonia Brooms  . Bladder leak   . Chest pain 06/2010   Costochondritis  . Diverticulosis   . Gallbladder problem   . GERD (gastroesophageal reflux disease)   . Hyperlipidemia   . IBS (irritable bowel syndrome)   . Melanoma ([place]) 2006   x1  . PCOS (polycystic ovarian syndrome)   . Plantar fasciitis 08/13/2011   Left is significant and RT is mild by [place] criteria   . Spasm of  esophagus   . Swelling   . Thrombocytopenia ([place]) 2002   133,000 platelets    PAST SURGICAL HISTORY: Past Surgical History:  Procedure Laterality Date  . CHOLECYSTECTOMY  2002   [facility]  . COLONOSCOPY    . MELANOMA EXCISION     LUE 2006  [facility]; Derm : Dr Crista Luria  . SHOULDER SURGERY     Left  . TONSILLECTOMY    . TOTAL ABDOMINAL HYSTERECTOMY  1991   abnormal PAP hx    SOCIAL HISTORY: Social History  Substance Use Topics  . Smoking status: Never Smoker  . Smokeless tobacco: Never Used  . Alcohol use Yes     Comment: rarely, only holiday's    FAMILY HISTORY: Family History  Problem Relation Age of Onset  . Hypertension Father   . Colon polyps Father   . GER disease Father   . Skin cancer Father   . Ovarian cancer Paternal Aunt   . Diabetes Mother   . Hypertension Mother   . Obesity Mother   . Heart attack  Maternal Grandmother 76  . Diabetes Maternal Grandmother   . Heart attack Maternal Grandfather 69  . Stroke Maternal Grandfather 75  . Colon cancer Paternal Grandfather        unsure age    ROS: Review of Systems  Constitutional: Positive for malaise/fatigue and weight loss.  Gastrointestinal: Negative for nausea and vomiting.  Musculoskeletal:       Negative muscle weakness    PHYSICAL EXAM: Blood pressure 107/71, pulse 72, temperature 97.9 F (36.6 C), temperature source Oral, height 5\' 3"  (1.6 m), weight 189 lb (85.7 kg), SpO2 99 %. Body mass index is 33.48 kg/m. Physical Exam  Constitutional: She is oriented to person, place, and time. She appears well-developed and well-nourished.  Cardiovascular: Normal rate.   Pulmonary/Chest: Effort normal.  Musculoskeletal: Normal range of motion.  Neurological: She is oriented to person, place, and time.  Skin: Skin is warm and dry.  Psychiatric: She has a normal mood and affect. Her behavior is normal.  Vitals reviewed.   RECENT LABS AND TESTS: BMET    Component Value Date/Time   NA 141 10/08/2016 0941   K 4.4 10/08/2016 0941   CL 99 10/08/2016 0941   CO2 27 10/08/2016 0941   GLUCOSE 89 10/08/2016 0941   GLUCOSE 96 12/04/2015 0935   BUN 11 10/08/2016 0941   CREATININE 0.55 (L) 10/08/2016 0941   CALCIUM 9.5 10/08/2016 0941   GFRNONAA 105 10/08/2016 0941   GFRAA 121 10/08/2016 0941   Lab Results  Component Value Date   HGBA1C 5.4 10/08/2016   HGBA1C 5.4 06/03/2016   HGBA1C 5.6 12/04/2015   Lab Results  Component Value Date   INSULIN 5.1 10/08/2016   INSULIN 7.4 06/03/2016   CBC    Component Value Date/Time   WBC 7.6 06/03/2016 1253   WBC 8.1 12/04/2015 0935   RBC 4.93 06/03/2016 1253   RBC 5.52 (H) 12/04/2015 0935   HGB 14.6 06/03/2016 1253   HGB 14.2 03/29/2010 1506   HCT 43.3 06/03/2016 1253   HCT 41.3 03/29/2010 1506   PLT 225.0 12/04/2015 0935   PLT 221 03/29/2010 1506   MCV 88 06/03/2016 1253    MCV 88.4 03/29/2010 1506   MCH 29.6 06/03/2016 1253   MCH 29.8 06/28/2010 0355   MCHC 33.7 06/03/2016 1253   MCHC 33.9 12/04/2015 0935   RDW 14.5 06/03/2016 1253   RDW 13.6 03/29/2010 1506   LYMPHSABS 1.5 06/03/2016  1253   LYMPHSABS 1.6 03/29/2010 1506   MONOABS 0.5 12/04/2015 0935   MONOABS 0.4 03/29/2010 1506   EOSABS 0.2 06/03/2016 1253   BASOSABS 0.0 06/03/2016 1253   BASOSABS 0.1 03/29/2010 1506   Iron/TIBC/Ferritin/ %Sat    Component Value Date/Time   IRON 65 01/22/2009 0000   IRONPCTSAT 21.8 01/22/2009 0000   Lipid Panel     Component Value Date/Time   CHOL 168 10/08/2016 0941   TRIG 117 10/08/2016 0941   HDL 42 10/08/2016 0941   CHOLHDL 5 12/04/2015 0935   VLDL 38.8 12/04/2015 0935   LDLCALC 103 (H) 10/08/2016 0941   LDLDIRECT 156.9 01/20/2008 0000   Hepatic Function Panel     Component Value Date/Time   PROT 6.8 10/08/2016 0941   ALBUMIN 4.4 10/08/2016 0941   AST 12 10/08/2016 0941   ALT 11 10/08/2016 0941   ALKPHOS 54 10/08/2016 0941   BILITOT 0.3 10/08/2016 0941   BILIDIR 0.1 12/01/2012 1035   IBILI NOT CALCULATED 06/28/2010 0355      Component Value Date/Time   TSH 1.550 06/03/2016 1253   TSH 2.27 12/04/2015 0935   TSH 1.97 12/04/2014 0958    ASSESSMENT AND PLAN: Vitamin D deficiency  Class 1 obesity with serious comorbidity and body mass index (BMI) of 33.0 to 33.9 in adult, unspecified obesity type  PLAN:  Vitamin D Deficiency Sharlotte was informed that low vitamin D levels contributes to fatigue and are associated with obesity, breast, and colon cancer. She has been given the ok to take OTC multivitamins and Kristin Rasmussen follow up for routine testing of vitamin D, at least 2-3 times per year. She was informed of the risk of over-replacement of vitamin D and agrees to not increase her dose unless he discusses this with [place] first. We Kristin Rasmussen recheck labs in 2 months and recheck Vit D. Kristin Rasmussen agrees to follow up with our clinic in 3 weeks.  We spent > than  50% of the 15 minute visit on the counseling as documented in the note.  Obesity Kristin Rasmussen is currently in the action stage of change. As such, her goal is to continue with weight loss efforts She has agreed to keep a food journal with 400-500 calories and 35+ grams of protein at supper daily and follow the Category 2 plan Der has been instructed to work up to a goal of 150 minutes of combined cardio and strengthening exercise per week for weight loss and overall health benefits. We discussed the following Behavioral Modification Strategies today: work on meal planning and easy cooking plans, celebration eating strategies, holiday eating strategies (halloween), and keep a strict food journal (for supper)   Kristin Rasmussen has agreed to follow up with our clinic in 3 weeks. She was informed of the importance of frequent follow up visits to maximize her success with intensive lifestyle modifications for her multiple health conditions.  I, Trixie Dredge, am acting as transcriptionist for Dennard Nip, MD  I have reviewed the above documentation for accuracy and completeness, and I agree with the above. -Dennard Nip, MD     Today's visit was # 10 out of 22.  Starting weight: 212 lbs Starting date: 06/03/16 Today's weight : 189 lbs  Today's date: 11/27/2016 Total lbs lost to date: 23 (Patients must lose 7 lbs in the first 6 months to continue with counseling)   ASK: We discussed the diagnosis of obesity with Kristin Rasmussen today and Kristin Rasmussen agreed to give [place] permission to discuss obesity behavioral modification  therapy today.  ASSESS: Kristin Rasmussen has the diagnosis of obesity and her BMI today is 33.49 Kristin Rasmussen is in the action stage of change   ADVISE: Kristin Rasmussen was educated on the multiple health risks of obesity as well as the benefit of weight loss to improve her health. She was advised of the need for long term treatment and the importance of lifestyle modifications.  AGREE: Multiple dietary  modification options and treatment options were discussed and  Kristin Rasmussen agreed to keep a food journal with 400-500 calories and 35+ grams of protein at supper daily and follow the Category 2 plan We discussed the following Behavioral Modification Strategies today: work on meal planning and easy cooking plans, celebration eating strategies, holiday eating strategies (halloween), and keep a strict food journal (for supper)

## 2016-12-04 ENCOUNTER — Ambulatory Visit (INDEPENDENT_AMBULATORY_CARE_PROVIDER_SITE_OTHER): Payer: PRIVATE HEALTH INSURANCE | Admitting: Internal Medicine

## 2016-12-04 ENCOUNTER — Encounter: Payer: Self-pay | Admitting: Internal Medicine

## 2016-12-04 VITALS — BP 114/72 | HR 73 | Temp 98.5°F | Resp 16 | Ht 63.0 in | Wt 193.0 lb

## 2016-12-04 DIAGNOSIS — K219 Gastro-esophageal reflux disease without esophagitis: Secondary | ICD-10-CM | POA: Diagnosis not present

## 2016-12-04 DIAGNOSIS — C439 Malignant melanoma of skin, unspecified: Secondary | ICD-10-CM | POA: Diagnosis not present

## 2016-12-04 DIAGNOSIS — F419 Anxiety disorder, unspecified: Secondary | ICD-10-CM | POA: Diagnosis not present

## 2016-12-04 DIAGNOSIS — M8580 Other specified disorders of bone density and structure, unspecified site: Secondary | ICD-10-CM | POA: Diagnosis not present

## 2016-12-04 DIAGNOSIS — Z0000 Encounter for general adult medical examination without abnormal findings: Secondary | ICD-10-CM | POA: Diagnosis not present

## 2016-12-04 NOTE — Assessment & Plan Note (Signed)
Taking lexapro 20 mg daily and xanax 2, occasionally 3 times a day Stable, controlled

## 2016-12-04 NOTE — Assessment & Plan Note (Addendum)
GERD controlled - still needs daily medication, has occasional GERD Continue daily medication Continue weight loss efforts

## 2016-12-04 NOTE — Assessment & Plan Note (Signed)
Up to date with derm visits

## 2016-12-04 NOTE — Patient Instructions (Addendum)
[facility] [place]   All other Health Maintenance issues reviewed.   All recommended immunizations and age-appropriate screenings are up-to-date or discussed.  No immunizations administered today.   Medications reviewed and updated.  Changes include  /  No changes recommended at this time.    Please followup in one year for a physical   Health Maintenance, Female Adopting a healthy lifestyle and getting preventive care can go a long way to promote health and wellness. Talk with your health care provider about what schedule of regular examinations is right for you. This is a good chance for you to check in with your provider about disease prevention and staying healthy. In between checkups, there are plenty of things you can do on your own. Experts have done a lot of research about which lifestyle changes and preventive measures are most likely to keep you healthy. Ask your health care provider for more information. Weight and diet Eat a healthy diet  Be sure to include plenty of vegetables, fruits, low-fat dairy products, and lean protein.  Do not eat a lot of foods high in solid fats, added sugars, or salt.  Get regular exercise. This is one of the most important things you can do for your health. ? Most adults should exercise for at least 150 minutes each week. The exercise should increase your heart rate and make you sweat (moderate-intensity exercise). ? Most adults should also do strengthening exercises at least twice a week. This is in addition to the moderate-intensity exercise.  Maintain a healthy weight  Body mass index (BMI) is a measurement that can be used to identify possible weight problems. It estimates body fat based on height and weight. Your health care provider can help determine your BMI and help you achieve or maintain a healthy weight.  For females 44 years of age and older: ? A BMI below 18.5 is considered  underweight. ? A BMI of 18.5 to 24.9 is normal. ? A BMI of 25 to 29.9 is considered overweight. ? A BMI of 30 and above is considered obese.  Watch levels of cholesterol and blood lipids  You should start having your blood tested for lipids and cholesterol at 56 years of age, then have this test every 5 years.  You may need to have your cholesterol levels checked more often if: ? Your lipid or cholesterol levels are high. ? You are older than 56 years of age. ? You are at high risk for heart disease.  Cancer screening Lung Cancer  Lung cancer screening is recommended for adults 60-40 years old who are at high risk for lung cancer because of a history of smoking.  A yearly low-dose CT scan of the lungs is recommended for people who: ? Currently smoke. ? Have quit within the past 15 years. ? Have at least a 30-pack-year history of smoking. A pack year is smoking an average of one pack of cigarettes a day for 1 year.  Yearly screening should continue until it has been 15 years since you quit.  Yearly screening should stop if you develop a health problem that would prevent you from having lung cancer treatment.  Breast Cancer  Practice breast self-awareness. This means understanding how your breasts normally appear and feel.  It also means doing regular breast self-exams. Let your health care provider know about any changes, no matter how small.  If you are in your 20s or 30s, you should have a clinical breast  exam (CBE) by a health care provider every 1-3 years as part of a regular health exam.  If you are 110 or older, have a CBE every year. Also consider having a breast X-ray (mammogram) every year.  If you have a family history of breast cancer, talk to your health care provider about genetic screening.  If you are at high risk for breast cancer, talk to your health care provider about having an MRI and a mammogram every year.  Breast cancer gene (BRCA) assessment is  recommended for women who have family members with BRCA-related cancers. BRCA-related cancers include: ? Breast. ? Ovarian. ? Tubal. ? Peritoneal cancers.  Results of the assessment will determine the need for genetic counseling and BRCA1 and BRCA2 testing.  Cervical Cancer Your health care provider may recommend that you be screened regularly for cancer of the pelvic organs (ovaries, uterus, and vagina). This screening involves a pelvic examination, including checking for microscopic changes to the surface of your cervix (Pap test). You may be encouraged to have this screening done every 3 years, beginning at age 110.  For women ages 71-65, health care providers may recommend pelvic exams and Pap testing every 3 years, or they may recommend the Pap and pelvic exam, combined with testing for human papilloma virus (HPV), every 5 years. Some types of HPV increase your risk of cervical cancer. Testing for HPV may also be done on women of any age with unclear Pap test results.  Other health care providers may not recommend any screening for nonpregnant women who are considered low risk for pelvic cancer and who do not have symptoms. Ask your health care provider if a screening pelvic exam is right for you.  If you have had past treatment for cervical cancer or a condition that could lead to cancer, you need Pap tests and screening for cancer for at least 20 years after your treatment. If Pap tests have been discontinued, your risk factors (such as having a new sexual partner) need to be reassessed to determine if screening should resume. Some women have medical problems that increase the chance of getting cervical cancer. In these cases, your health care provider may recommend more frequent screening and Pap tests.  Colorectal Cancer  This type of cancer can be detected and often prevented.  Routine colorectal cancer screening usually begins at 56 years of age and continues through 56 years of  age.  Your health care provider may recommend screening at an earlier age if you have risk factors for colon cancer.  Your health care provider may also recommend using home test kits to check for hidden blood in the stool.  A small camera at the end of a tube can be used to examine your colon directly (sigmoidoscopy or colonoscopy). This is done to check for the earliest forms of colorectal cancer.  Routine screening usually begins at age 85.  Direct examination of the colon should be repeated every 5-10 years through 56 years of age. However, you may need to be screened more often if early forms of precancerous polyps or small growths are found.  Skin Cancer  Check your skin from head to toe regularly.  Tell your health care provider about any new moles or changes in moles, especially if there is a change in a mole's shape or color.  Also tell your health care provider if you have a mole that is larger than the size of a pencil eraser.  Always use sunscreen. Apply sunscreen  liberally and repeatedly throughout the day.  Protect yourself by wearing long sleeves, pants, a wide-brimmed hat, and sunglasses whenever you are outside.  Heart disease, diabetes, and high blood pressure  High blood pressure causes heart disease and increases the risk of stroke. High blood pressure is more likely to develop in: ? People who have blood pressure in the high end of the normal range (130-139/85-89 mm Hg). ? People who are overweight or obese. ? People who are African American.  If you are 14-36 years of age, have your blood pressure checked every 3-5 years. If you are 55 years of age or older, have your blood pressure checked every year. You should have your blood pressure measured twice-once when you are at a hospital or clinic, and once when you are not at a hospital or clinic. Record the average of the two measurements. To check your blood pressure when you are not at a hospital or clinic, you  can use: ? An automated blood pressure machine at a pharmacy. ? A home blood pressure monitor.  If you are between 38 years and 78 years old, ask your health care provider if you should take aspirin to prevent strokes.  Have regular diabetes screenings. This involves taking a blood sample to check your fasting blood sugar level. ? If you are at a normal weight and have a low risk for diabetes, have this test once every three years after 56 years of age. ? If you are overweight and have a high risk for diabetes, consider being tested at a younger age or more often. Preventing infection Hepatitis B  If you have a higher risk for hepatitis B, you should be screened for this virus. You are considered at high risk for hepatitis B if: ? You were born in a country where hepatitis B is common. Ask your health care provider which countries are considered high risk. ? Your parents were born in a high-risk country, and you have not been immunized against hepatitis B (hepatitis B vaccine). ? You have HIV or AIDS. ? You use needles to inject street drugs. ? You live with someone who has hepatitis B. ? You have had sex with someone who has hepatitis B. ? You get hemodialysis treatment. ? You take certain medicines for conditions, including cancer, organ transplantation, and autoimmune conditions.  Hepatitis C  Blood testing is recommended for: ? Everyone born from [birth year] through 1965. ? Anyone with known risk factors for hepatitis C.  Sexually transmitted infections (STIs)  You should be screened for sexually transmitted infections (STIs) including gonorrhea and chlamydia if: ? You are sexually active and are younger than 56 years of age. ? You are older than 56 years of age and your health care provider tells you that you are at risk for this type of infection. ? Your sexual activity has changed since you were last screened and you are at an increased risk for chlamydia or gonorrhea. Ask your  health care provider if you are at risk.  If you do not have HIV, but are at risk, it may be recommended that you take a prescription medicine daily to prevent HIV infection. This is called pre-exposure prophylaxis (PrEP). You are considered at risk if: ? You are sexually active and do not regularly use condoms or know the HIV status of your partner(s). ? You take drugs by injection. ? You are sexually active with a partner who has HIV.  Talk with your health care provider about  whether you are at high risk of being infected with HIV. If you choose to begin PrEP, you should first be tested for HIV. You should then be tested every 3 months for as long as you are taking PrEP. Pregnancy  If you are premenopausal and you may become pregnant, ask your health care provider about preconception counseling.  If you may become pregnant, take 400 to 800 micrograms (mcg) of folic acid every day.  If you want to prevent pregnancy, talk to your health care provider about birth control (contraception). Osteoporosis and menopause  Osteoporosis is a disease in which the bones lose minerals and strength with aging. This can result in serious bone fractures. Your risk for osteoporosis can be identified using a bone density scan.  If you are 83 years of age or older, or if you are at risk for osteoporosis and fractures, ask your health care provider if you should be screened.  Ask your health care provider whether you should take a calcium or vitamin D supplement to lower your risk for osteoporosis.  Menopause may have certain physical symptoms and risks.  Hormone replacement therapy may reduce some of these symptoms and risks. Talk to your health care provider about whether hormone replacement therapy is right for you. Follow these instructions at home:  Schedule regular health, dental, and eye exams.  Stay current with your immunizations.  Do not use any tobacco products including cigarettes, chewing  tobacco, or electronic cigarettes.  If you are pregnant, do not drink alcohol.  If you are breastfeeding, limit how much and how often you drink alcohol.  Limit alcohol intake to no more than 1 drink per day for nonpregnant women. One drink equals 12 ounces of beer, 5 ounces of wine, or 1 ounces of hard liquor.  Do not use street drugs.  Do not share needles.  Ask your health care provider for help if you need support or information about quitting drugs.  Tell your health care provider if you often feel depressed.  Tell your health care provider if you have ever been abused or do not feel safe at home. This information is not intended to replace advice given to you by your health care provider. Make sure you discuss any questions you have with your health care provider. Document Released: 08/05/2010 Document Revised: 06/28/2015 Document Reviewed: 10/24/2014 Elsevier Interactive Patient Education  [employer].

## 2016-12-04 NOTE — Assessment & Plan Note (Signed)
dexa up to date Continue regular exercise Continue calcium/ vitamin d

## 2016-12-04 NOTE — Progress Notes (Signed)
Subjective:    Patient ID: Kristin Rasmussen, female    DOB: 1960/08/25, 56 y.o.   MRN: [id number]  HPI She is here for a physical exam.   She has lost 21 lbs following with the healthy weight and wellness.  She is exercising and eating healthy.   She has no major concerns.  She has questions regarding supplements and health maintenance.   Medications and allergies reviewed with patient and updated if appropriate.  Patient Active Problem List   Diagnosis Date Noted  . Insulin resistance 10/08/2016  . Vitamin D deficiency 09/15/2016  . Class 1 obesity with serious comorbidity and body mass index (BMI) of 34.0 to 34.9 in adult 09/15/2016  . Osteopenia 04/19/2015  . Low back pain syndrome 09/19/2014  . Left supraspinatus tendonitis 10/18/2013  . Anxiety   . Metatarsalgia 08/13/2011  . DIVERTICULOSIS, COLON 01/20/2008  . SKIN CANCER, HX OF 01/20/2008  . Melanoma of skin ([place]) 10/28/2006  . HYPERLIPIDEMIA 10/28/2006  . GERD 10/28/2006    Current Outpatient Prescriptions on File Prior to Visit  Medication Sig Dispense Refill  . acetaminophen (TYLENOL) 500 MG tablet Take 500 mg by mouth every 6 (six) hours as needed.    . ALPRAZolam (XANAX) 0.25 MG tablet Take 12.5 mg by mouth 2 (two) times daily.      . calcium citrate-vitamin D (CITRACAL+D) 315-200 MG-UNIT tablet Take 1 tablet by mouth 2 (two) times daily.    . cetirizine (ZYRTEC) 10 MG tablet Take 10 mg by mouth daily.    . diphenhydrAMINE (BENADRYL) 25 mg capsule Take 25 mg by mouth every 6 (six) hours as needed.    . docusate sodium (COLACE) 100 MG capsule Take 100 mg by mouth daily as needed for mild constipation.    [name] escitalopram (LEXAPRO) 20 MG tablet Take 20 mg by mouth daily.    [name] esomeprazole (NEXIUM) 40 MG capsule TAKE ONE CAPSULE BY MOUTH ONE TIME DAILY 30 capsule 0  . estradiol (VIVELLE-DOT) 0.075 MG/24HR Apply 1 patch externally 2 times a week  5  . fish oil-omega-3 fatty acids 1000 MG capsule Take 1,200 mg by  mouth daily.     . magnesium hydroxide (MILK OF MAGNESIA) 400 MG/5ML suspension Take by mouth daily as needed for mild constipation.    . Multiple Vitamin (MULTIVITAMIN) tablet Take 1 tablet by mouth daily.      [name] oxybutynin (DITROPAN-XL) 5 MG 24 hr tablet Take 5 mg by mouth at bedtime.    . pravastatin (PRAVACHOL) 20 MG tablet Take 1 tablet (20 mg total) by mouth daily. Annual appt due in Oct must see provider for future refills 30 tablet 0   No current facility-administered medications on file prior to visit.     Past Medical History:  Diagnosis Date  . Anemia 2004   HCT 34.7  . Anxiety    follows with psyc for same  . Back pain   . Basal cell cancer    x3; Dr Tonia Brooms  . Bladder leak   . Chest pain 06/2010   Costochondritis  . Diverticulosis   . Gallbladder problem   . GERD (gastroesophageal reflux disease)   . Hyperlipidemia   . IBS (irritable bowel syndrome)   . Melanoma ([place]) 2006   x1  . PCOS (polycystic ovarian syndrome)   . Plantar fasciitis 08/13/2011   Left is significant and RT is mild by [place] criteria   . Spasm of esophagus   . Swelling   .  Thrombocytopenia ([place]) 2002   133,000 platelets    Past Surgical History:  Procedure Laterality Date  . CHOLECYSTECTOMY  2002   [facility]  . COLONOSCOPY    . MELANOMA EXCISION     LUE 2006  [facility]; Derm : Dr Crista Luria  . SHOULDER SURGERY     Left  . TONSILLECTOMY    . TOTAL ABDOMINAL HYSTERECTOMY  1991   abnormal PAP hx    Social History   Social History  . Marital status: Married    Spouse name: N/A  . Number of children: 3  . Years of education: 12   Occupational History  . Psychiatrist   .  [employer]   Social History Main Topics  . Smoking status: Never Smoker  . Smokeless tobacco: Never Used  . Alcohol use Yes     Comment: rarely, only holiday's  . Drug use: No  . Sexual activity: Not on file   Other Topics Concern  . Not on file   Social History Narrative   Does some yard work and  walking for exercise - couple of days a week    Family History  Problem Relation Age of Onset  . Hypertension Father   . Colon polyps Father   . GER disease Father   . Skin cancer Father   . Ovarian cancer Paternal Aunt   . Diabetes Mother   . Hypertension Mother   . Obesity Mother   . Heart attack Maternal Grandmother 76  . Diabetes Maternal Grandmother   . Heart attack Maternal Grandfather 69  . Stroke Maternal Grandfather 67  . Colon cancer Paternal Grandfather        unsure age    Review of Systems  Constitutional: Negative for chills and fever.  Eyes: Negative for visual disturbance.  Respiratory: Negative for cough, shortness of breath and wheezing.   Cardiovascular: Negative for chest pain, palpitations and leg swelling.  Gastrointestinal: Negative for abdominal pain, blood in stool, constipation, diarrhea and nausea.       GERD occ with medication  Genitourinary: Negative for dysuria and hematuria.  Musculoskeletal: Positive for arthralgias and back pain.  Skin: Negative for color change and rash.  Neurological: Negative for light-headedness and headaches.  Psychiatric/Behavioral: Negative for dysphoric mood. The patient is nervous/anxious (controlled).        Objective:   Vitals:   12/04/16 0849  BP: 114/72  Pulse: 73  Resp: 16  Temp: 98.5 F (36.9 C)  SpO2: 97%   Filed Weights   12/04/16 0849  Weight: 193 lb (87.5 kg)   Body mass index is 34.19 kg/m.  Wt Readings from Last 3 Encounters:  12/04/16 193 lb (87.5 kg)  11/27/16 189 lb (85.7 kg)  10/28/16 191 lb (86.6 kg)     Physical Exam Constitutional: She appears well-developed and well-nourished. No distress.  HENT:  Head: Normocephalic and atraumatic.  Right Ear: External ear normal. Normal ear canal and TM Left Ear: External ear normal.  Normal ear canal and TM Mouth/Throat: Oropharynx is clear and moist.  Eyes: Conjunctivae and EOM are normal.  Neck: Neck supple. No tracheal deviation  present. No thyromegaly present.  No carotid bruit  Cardiovascular: Normal rate, regular rhythm and normal heart sounds.   No murmur heard.  No edema. Pulmonary/Chest: Effort normal and breath sounds normal. No respiratory distress. She has no wheezes. She has no rales.  Breast: deferred to Gyn Abdominal: Soft. She exhibits no distension. There is no tenderness.  Lymphadenopathy: She has no cervical adenopathy.  Skin: Skin is warm and dry. She is not diaphoretic.  Psychiatric: She has a normal mood and affect. Her behavior is normal.        Assessment & Plan:   Physical exam: Screening blood work  ordered Immunizations  Discussed shingrix,  Flu vaccine today Colonoscopy  Up to date  Mammogram  Up to date  Gyn  Up to date  Dexa   Up to date  Eye exams  Up to date  EKG  Last done 06/2016 Exercise  Back exercise, walking or elliptical Weight - working on weight loss - following at the weight management clinic and has lost 23 lbs - still working on it Skin  No concerns, sees derm Substance abuse   none  See Problem List for Assessment and Plan of chronic medical problems.  FU in one year

## 2016-12-22 ENCOUNTER — Ambulatory Visit (INDEPENDENT_AMBULATORY_CARE_PROVIDER_SITE_OTHER): Payer: PRIVATE HEALTH INSURANCE | Admitting: Family Medicine

## 2016-12-22 VITALS — BP 105/71 | HR 76 | Temp 98.0°F | Ht 63.0 in | Wt 189.0 lb

## 2016-12-22 DIAGNOSIS — E559 Vitamin D deficiency, unspecified: Secondary | ICD-10-CM

## 2016-12-22 DIAGNOSIS — E669 Obesity, unspecified: Secondary | ICD-10-CM

## 2016-12-22 DIAGNOSIS — Z6833 Body mass index (BMI) 33.0-33.9, adult: Secondary | ICD-10-CM | POA: Diagnosis not present

## 2016-12-23 NOTE — Progress Notes (Signed)
Office: [phone]  /  Fax: [phone]   HPI:   Chief Complaint: OBESITY Kristin Rasmussen is here to discuss her progress with her obesity treatment plan. She is on the Category 2 plan and is following her eating plan approximately 95 % of the time. She states she is exercising yard work for 30 minutes 2 times per week. Kristin Rasmussen has done well with maintaining her weight loss but she notes extra temptations and sabotage at work. Her weight is 189 lb (85.7 kg) today and has maintained weight over a period of 3 weeks since her last visit. She has lost 23 lbs since starting treatment with [place].  Vitamin D deficiency Kristin Rasmussen has a diagnosis of vitamin D deficiency. Her vitamin D is now at goal and she stopped her vitamin D prescription but is still on OTC vitamin D. She  denies nausea, vomiting or muscle weakness.  ALLERGIES: No Known Allergies  MEDICATIONS: Current Outpatient Medications on File Prior to Visit  Medication Sig Dispense Refill  . acetaminophen (TYLENOL) 500 MG tablet Take 500 mg by mouth every 6 (six) hours as needed.    . ALPRAZolam (XANAX) 0.25 MG tablet Take 12.5 mg by mouth 2 (two) times daily.      . Ascorbic Acid (VITAMIN C) 500 MG CAPS Take by mouth.    . calcium citrate-vitamin D (CITRACAL+D) 315-200 MG-UNIT tablet Take 1 tablet by mouth 2 (two) times daily.    . cetirizine (ZYRTEC) 10 MG tablet Take 10 mg by mouth daily.    . diphenhydrAMINE (BENADRYL) 25 mg capsule Take 25 mg by mouth every 6 (six) hours as needed.    . docusate sodium (COLACE) 100 MG capsule Take 100 mg by mouth daily as needed for mild constipation.    [name] escitalopram (LEXAPRO) 20 MG tablet Take 20 mg by mouth daily.    [name] esomeprazole (NEXIUM) 40 MG capsule TAKE ONE CAPSULE BY MOUTH ONE TIME DAILY 30 capsule 0  . estradiol (VIVELLE-DOT) 0.075 MG/24HR Apply 1 patch externally 2 times a week  5  . fish oil-omega-3 fatty acids 1000 MG capsule Take 1,200 mg by mouth daily.     . magnesium hydroxide (MILK OF  MAGNESIA) 400 MG/5ML suspension Take by mouth daily as needed for mild constipation.    . Multiple Vitamin (MULTIVITAMIN) tablet Take 1 tablet by mouth daily.      [name] oxybutynin (DITROPAN-XL) 5 MG 24 hr tablet Take 5 mg by mouth at bedtime.    . pravastatin (PRAVACHOL) 20 MG tablet Take 1 tablet (20 mg total) by mouth daily. Annual appt due in Oct must see provider for future refills 30 tablet 0   No current facility-administered medications on file prior to visit.     PAST MEDICAL HISTORY: Past Medical History:  Diagnosis Date  . Anemia 2004   HCT 34.7  . Anxiety    follows with psyc for same  . Back pain   . Basal cell cancer    x3; Dr Tonia Brooms  . Bladder leak   . Chest pain 06/2010   Costochondritis  . Diverticulosis   . Gallbladder problem   . GERD (gastroesophageal reflux disease)   . Hyperlipidemia   . IBS (irritable bowel syndrome)   . Melanoma ([place]) 2006   x1  . PCOS (polycystic ovarian syndrome)   . Plantar fasciitis 08/13/2011   Left is significant and RT is mild by [place] criteria   . Spasm of esophagus   . Swelling   . Thrombocytopenia (  [place]) 2002   133,000 platelets    PAST SURGICAL HISTORY: Past Surgical History:  Procedure Laterality Date  . CHOLECYSTECTOMY  2002   [facility]  . COLONOSCOPY    . MELANOMA EXCISION     LUE 2006  [facility]; Derm : Dr Crista Luria  . SHOULDER SURGERY     Left  . TONSILLECTOMY    . TOTAL ABDOMINAL HYSTERECTOMY  1991   abnormal PAP hx    SOCIAL HISTORY: Social History   Tobacco Use  . Smoking status: Never Smoker  . Smokeless tobacco: Never Used  Substance Use Topics  . Alcohol use: Yes    Comment: rarely, only holiday's  . Drug use: No    FAMILY HISTORY: Family History  Problem Relation Age of Onset  . Hypertension Father   . Colon polyps Father   . GER disease Father   . Skin cancer Father   . Ovarian cancer Paternal Aunt   . Diabetes Mother   . Hypertension Mother   . Obesity Mother   . Heart attack Maternal Grandmother  76  . Diabetes Maternal Grandmother   . Heart attack Maternal Grandfather 69  . Stroke Maternal Grandfather 37  . Colon cancer Paternal Grandfather        unsure age    ROS: Review of Systems  Constitutional: Negative for weight loss.  Gastrointestinal: Negative for nausea and vomiting.  Musculoskeletal:       Negative muscle weakness    PHYSICAL EXAM: Blood pressure 105/71, pulse 76, temperature 98 F (36.7 C), temperature source Oral, height 5\' 3"  (1.6 m), weight 189 lb (85.7 kg), SpO2 98 %. Body mass index is 33.48 kg/m. Physical Exam  Constitutional: She is oriented to person, place, and time. She appears well-developed and well-nourished.  Cardiovascular: Normal rate.  Pulmonary/Chest: Effort normal.  Musculoskeletal: Normal range of motion.  Neurological: She is oriented to person, place, and time.  Skin: Skin is warm and dry.  Psychiatric: She has a normal mood and affect. Her behavior is normal.  Vitals reviewed.   RECENT LABS AND TESTS: BMET    Component Value Date/Time   NA 141 10/08/2016 0941   K 4.4 10/08/2016 0941   CL 99 10/08/2016 0941   CO2 27 10/08/2016 0941   GLUCOSE 89 10/08/2016 0941   GLUCOSE 96 12/04/2015 0935   BUN 11 10/08/2016 0941   CREATININE 0.55 (L) 10/08/2016 0941   CALCIUM 9.5 10/08/2016 0941   GFRNONAA 105 10/08/2016 0941   GFRAA 121 10/08/2016 0941   Lab Results  Component Value Date   HGBA1C 5.4 10/08/2016   HGBA1C 5.4 06/03/2016   HGBA1C 5.6 12/04/2015   Lab Results  Component Value Date   INSULIN 5.1 10/08/2016   INSULIN 7.4 06/03/2016   CBC    Component Value Date/Time   WBC 7.6 06/03/2016 1253   WBC 8.1 12/04/2015 0935   RBC 4.93 06/03/2016 1253   RBC 5.52 (H) 12/04/2015 0935   HGB 14.6 06/03/2016 1253   HGB 14.2 03/29/2010 1506   HCT 43.3 06/03/2016 1253   HCT 41.3 03/29/2010 1506   PLT 225.0 12/04/2015 0935   PLT 221 03/29/2010 1506   MCV 88 06/03/2016 1253   MCV 88.4 03/29/2010 1506   MCH 29.6  06/03/2016 1253   MCH 29.8 06/28/2010 0355   MCHC 33.7 06/03/2016 1253   MCHC 33.9 12/04/2015 0935   RDW 14.5 06/03/2016 1253   RDW 13.6 03/29/2010 1506   LYMPHSABS 1.5 06/03/2016 1253   LYMPHSABS  1.6 03/29/2010 1506   MONOABS 0.5 12/04/2015 0935   MONOABS 0.4 03/29/2010 1506   EOSABS 0.2 06/03/2016 1253   BASOSABS 0.0 06/03/2016 1253   BASOSABS 0.1 03/29/2010 1506   Iron/TIBC/Ferritin/ %Sat    Component Value Date/Time   IRON 65 01/22/2009 0000   IRONPCTSAT 21.8 01/22/2009 0000   Lipid Panel     Component Value Date/Time   CHOL 168 10/08/2016 0941   TRIG 117 10/08/2016 0941   HDL 42 10/08/2016 0941   CHOLHDL 5 12/04/2015 0935   VLDL 38.8 12/04/2015 0935   LDLCALC 103 (H) 10/08/2016 0941   LDLDIRECT 156.9 01/20/2008 0000   Hepatic Function Panel     Component Value Date/Time   PROT 6.8 10/08/2016 0941   ALBUMIN 4.4 10/08/2016 0941   AST 12 10/08/2016 0941   ALT 11 10/08/2016 0941   ALKPHOS 54 10/08/2016 0941   BILITOT 0.3 10/08/2016 0941   BILIDIR 0.1 12/01/2012 1035   IBILI NOT CALCULATED 06/28/2010 0355      Component Value Date/Time   TSH 1.550 06/03/2016 1253   TSH 2.27 12/04/2015 0935   TSH 1.97 12/04/2014 0958    ASSESSMENT AND PLAN: Vitamin D deficiency  Class 1 obesity with serious comorbidity and body mass index (BMI) of 33.0 to 33.9 in adult, unspecified obesity type  PLAN:  Vitamin D Deficiency Kristin Rasmussen was informed that low vitamin D levels contributes to fatigue and are associated with obesity, breast, and colon cancer. She agrees to continue to take OTC vitamin D. We will recheck labs in 1 month and will follow up for routine testing of vitamin D, at least 2-3 times per year. She was informed of the risk of over-replacement of vitamin D and agrees to not increase her dose unless he discusses this with [place] first. Kristin Rasmussen agrees to follow up with our clinic in 4 weeks.  We spent > than 50% of the 15 minute visit on the counseling as documented in  the note.  Obesity Kristin Rasmussen is currently in the action stage of change. As such, her goal is to continue with weight loss efforts She has agreed to keep a food journal with 400 to 500 calories and 35 grams of protein at supper daily and follow the Category 2 plan Kristin Rasmussen has been instructed to work up to a goal of 150 minutes of combined cardio and strengthening exercise per week for weight loss and overall health benefits. We discussed the following Behavioral Modification Strategies today: dealing with family or coworker sabotage, travel eating strategies and celebration eating strategies  Kristin Rasmussen has agreed to follow up with our clinic in 4 weeks. She was informed of the importance of frequent follow up visits to maximize her success with intensive lifestyle modifications for her multiple health conditions.  I, Doreene Nest, am acting as transcriptionist for Dennard Nip, MD  I have reviewed the above documentation for accuracy and completeness, and I agree with the above. -Dennard Nip, MD    OBESITY BEHAVIORAL INTERVENTION VISIT  Today's visit was # 11 out of 22.  Starting weight: 212 lbs Starting date: 06/03/16 Today's weight : 189 lbs  Today's date: 12/22/2016 Total lbs lost to date: 23 (Patients must lose 7 lbs in the first 6 months to continue with counseling)   ASK: We discussed the diagnosis of obesity with Kristin Rasmussen today and Kristin Rasmussen agreed to give [place] permission to discuss obesity behavioral modification therapy today.  ASSESS: Kristin Rasmussen has the diagnosis of obesity and her BMI today  is 33.49 Kristin Rasmussen is in the action stage of change   ADVISE: Kristin Rasmussen was educated on the multiple health risks of obesity as well as the benefit of weight loss to improve her health. She was advised of the need for long term treatment and the importance of lifestyle modifications.  AGREE: Multiple dietary modification options and treatment options were discussed and  Kristin Rasmussen agreed to keep  a food journal with 400 to 500 calories and 35 grams of protein at supper daily and follow the Category 2 plan We discussed the following Behavioral Modification Strategies today: dealing with family or coworker sabotage, travel eating strategies and celebration eating strategies

## 2017-01-19 ENCOUNTER — Ambulatory Visit (INDEPENDENT_AMBULATORY_CARE_PROVIDER_SITE_OTHER): Payer: PRIVATE HEALTH INSURANCE | Admitting: Family Medicine

## 2017-01-19 ENCOUNTER — Other Ambulatory Visit: Payer: Self-pay | Admitting: Internal Medicine

## 2017-01-19 VITALS — BP 107/69 | HR 75 | Temp 97.9°F | Ht 63.0 in | Wt 189.0 lb

## 2017-01-19 DIAGNOSIS — Z6833 Body mass index (BMI) 33.0-33.9, adult: Secondary | ICD-10-CM

## 2017-01-19 DIAGNOSIS — E669 Obesity, unspecified: Secondary | ICD-10-CM | POA: Diagnosis not present

## 2017-01-19 DIAGNOSIS — L659 Nonscarring hair loss, unspecified: Secondary | ICD-10-CM | POA: Diagnosis not present

## 2017-01-19 NOTE — Progress Notes (Signed)
Office: [phone]  /  Fax: [phone]   HPI:   Chief Complaint: OBESITY Kristin Rasmussen is here to discuss her progress with her obesity treatment plan. She is on the Category 2 plan and is following her eating plan approximately 90 % of the time. She states she is exercising 0 minutes 0 times per week. Kristin Rasmussen has done well with maintaining weight. She has done well with controlling her portions and increasing her vegetables. Kristin Rasmussen has had increased celebration eating but less than in previous years. Her weight is 189 lb (85.7 kg) today and has maintained weight over a period of 4 weeks since her last visit. She has lost 23 lbs since starting treatment with [place].  Generalized Hair Loss Kristin Rasmussen notes a mild generalized hair thinning. She has no alopecia and no traction loss or breakage. She colors her hair every 3 to 4 months.  ALLERGIES: No Known Allergies  MEDICATIONS: Current Outpatient Medications on File Prior to Visit  Medication Sig Dispense Refill  . acetaminophen (TYLENOL) 500 MG tablet Take 500 mg by mouth every 6 (six) hours as needed.    . ALPRAZolam (XANAX) 0.25 MG tablet Take 12.5 mg by mouth 2 (two) times daily.      . Ascorbic Acid (VITAMIN C) 500 MG CAPS Take by mouth.    . calcium citrate-vitamin D (CITRACAL+D) 315-200 MG-UNIT tablet Take 1 tablet by mouth 2 (two) times daily.    . cetirizine (ZYRTEC) 10 MG tablet Take 10 mg by mouth daily.    . diphenhydrAMINE (BENADRYL) 25 mg capsule Take 25 mg by mouth every 6 (six) hours as needed.    . docusate sodium (COLACE) 100 MG capsule Take 100 mg by mouth daily as needed for mild constipation.    [name] escitalopram (LEXAPRO) 20 MG tablet Take 20 mg by mouth daily.    [name] esomeprazole (NEXIUM) 40 MG capsule TAKE ONE CAPSULE BY MOUTH ONE TIME DAILY 30 capsule 0  . estradiol (VIVELLE-DOT) 0.075 MG/24HR Apply 1 patch externally 2 times a week  5  . fish oil-omega-3 fatty acids 1000 MG capsule Take 1,200 mg by mouth daily.     .  magnesium hydroxide (MILK OF MAGNESIA) 400 MG/5ML suspension Take by mouth daily as needed for mild constipation.    . Multiple Vitamin (MULTIVITAMIN) tablet Take 1 tablet by mouth daily.      [name] oxybutynin (DITROPAN-XL) 5 MG 24 hr tablet Take 5 mg by mouth at bedtime.     No current facility-administered medications on file prior to visit.     PAST MEDICAL HISTORY: Past Medical History:  Diagnosis Date  . Anemia 2004   HCT 34.7  . Anxiety    follows with psyc for same  . Back pain   . Basal cell cancer    x3; Dr Tonia Brooms  . Bladder leak   . Chest pain 06/2010   Costochondritis  . Diverticulosis   . Gallbladder problem   . GERD (gastroesophageal reflux disease)   . Hyperlipidemia   . IBS (irritable bowel syndrome)   . Melanoma ([place]) 2006   x1  . PCOS (polycystic ovarian syndrome)   . Plantar fasciitis 08/13/2011   Left is significant and RT is mild by [place] criteria   . Spasm of esophagus   . Swelling   . Thrombocytopenia ([place]) 2002   133,000 platelets    PAST SURGICAL HISTORY: Past Surgical History:  Procedure Laterality Date  . CHOLECYSTECTOMY  2002   [facility]  . COLONOSCOPY    .  MELANOMA EXCISION     LUE 2006  [facility]; Derm : Dr Crista Luria  . SHOULDER SURGERY     Left  . TONSILLECTOMY    . TOTAL ABDOMINAL HYSTERECTOMY  1991   abnormal PAP hx    SOCIAL HISTORY: Social History   Tobacco Use  . Smoking status: Never Smoker  . Smokeless tobacco: Never Used  Substance Use Topics  . Alcohol use: Yes    Comment: rarely, only holiday's  . Drug use: No    FAMILY HISTORY: Family History  Problem Relation Age of Onset  . Hypertension Father   . Colon polyps Father   . GER disease Father   . Skin cancer Father   . Ovarian cancer Paternal Aunt   . Diabetes Mother   . Hypertension Mother   . Obesity Mother   . Heart attack Maternal Grandmother 76  . Diabetes Maternal Grandmother   . Heart attack Maternal Grandfather 69  . Stroke Maternal Grandfather 30  . Colon  cancer Paternal Grandfather        unsure age    ROS: Review of Systems  Constitutional: Negative for weight loss.       Positive hair loss    PHYSICAL EXAM: Blood pressure 107/69, pulse 75, temperature 97.9 F (36.6 C), temperature source Oral, height 5\' 3"  (1.6 m), weight 189 lb (85.7 kg), SpO2 97 %. Body mass index is 33.48 kg/m. Physical Exam  Constitutional: She is oriented to person, place, and time. She appears well-developed and well-nourished.  Cardiovascular: Normal rate.  Pulmonary/Chest: Effort normal.  Musculoskeletal: Normal range of motion.  Neurological: She is oriented to person, place, and time.  Skin: Skin is warm and dry.  Psychiatric: She has a normal mood and affect. Her behavior is normal.  Vitals reviewed.   RECENT LABS AND TESTS: BMET    Component Value Date/Time   NA 141 10/08/2016 0941   K 4.4 10/08/2016 0941   CL 99 10/08/2016 0941   CO2 27 10/08/2016 0941   GLUCOSE 89 10/08/2016 0941   GLUCOSE 96 12/04/2015 0935   BUN 11 10/08/2016 0941   CREATININE 0.55 (L) 10/08/2016 0941   CALCIUM 9.5 10/08/2016 0941   GFRNONAA 105 10/08/2016 0941   GFRAA 121 10/08/2016 0941   Lab Results  Component Value Date   HGBA1C 5.4 10/08/2016   HGBA1C 5.4 06/03/2016   HGBA1C 5.6 12/04/2015   Lab Results  Component Value Date   INSULIN 5.1 10/08/2016   INSULIN 7.4 06/03/2016   CBC    Component Value Date/Time   WBC 7.6 06/03/2016 1253   WBC 8.1 12/04/2015 0935   RBC 4.93 06/03/2016 1253   RBC 5.52 (H) 12/04/2015 0935   HGB 14.6 06/03/2016 1253   HGB 14.2 03/29/2010 1506   HCT 43.3 06/03/2016 1253   HCT 41.3 03/29/2010 1506   PLT 225.0 12/04/2015 0935   PLT 221 03/29/2010 1506   MCV 88 06/03/2016 1253   MCV 88.4 03/29/2010 1506   MCH 29.6 06/03/2016 1253   MCH 29.8 06/28/2010 0355   MCHC 33.7 06/03/2016 1253   MCHC 33.9 12/04/2015 0935   RDW 14.5 06/03/2016 1253   RDW 13.6 03/29/2010 1506   LYMPHSABS 1.5 06/03/2016 1253   LYMPHSABS 1.6  03/29/2010 1506   MONOABS 0.5 12/04/2015 0935   MONOABS 0.4 03/29/2010 1506   EOSABS 0.2 06/03/2016 1253   BASOSABS 0.0 06/03/2016 1253   BASOSABS 0.1 03/29/2010 1506   Iron/TIBC/Ferritin/ %Sat    Component Value Date/Time  IRON 65 01/22/2009 0000   IRONPCTSAT 21.8 01/22/2009 0000   Lipid Panel     Component Value Date/Time   CHOL 168 10/08/2016 0941   TRIG 117 10/08/2016 0941   HDL 42 10/08/2016 0941   CHOLHDL 5 12/04/2015 0935   VLDL 38.8 12/04/2015 0935   LDLCALC 103 (H) 10/08/2016 0941   LDLDIRECT 156.9 01/20/2008 0000   Hepatic Function Panel     Component Value Date/Time   PROT 6.8 10/08/2016 0941   ALBUMIN 4.4 10/08/2016 0941   AST 12 10/08/2016 0941   ALT 11 10/08/2016 0941   ALKPHOS 54 10/08/2016 0941   BILITOT 0.3 10/08/2016 0941   BILIDIR 0.1 12/01/2012 1035   IBILI NOT CALCULATED 06/28/2010 0355      Component Value Date/Time   TSH 1.550 06/03/2016 1253   TSH 2.27 12/04/2015 0935   TSH 1.97 12/04/2014 0958    ASSESSMENT AND PLAN: Hair loss - Generalized  Class 1 obesity with serious comorbidity and body mass index (BMI) of 33.0 to 33.9 in adult, unspecified obesity type  PLAN:  Generalized Hair Loss Kristin Rasmussen was advised hair loss is unlikely due to weight loss, as she has not decreased her calories extensively and she is eating a balanced protein and vegetable rich diet. Kristin Rasmussen is due for labs in 1 month, if not improved, we will check labs related to hair loss at the same time. Kristin Rasmussen agrees to follow up at the agreed upon time.  We spent > than 50% of the 15 minute visit on the counseling as documented in the note.  Obesity Kristin Rasmussen is currently in the action stage of change. As such, her goal is to continue with weight loss efforts She has agreed to keep a food journal with 400 to 500 calories and 35 grams of protein at supper daily and follow the Category 2 plan Kristin Rasmussen has been instructed to work up to a goal of 150 minutes of combined cardio  and strengthening exercise per week for weight loss and overall health benefits. We discussed the following Behavioral Modification Strategies today: dealing with family or coworker sabotage, travel eating strategies, celebration eating strategies and holiday eating strategies   Kristin Rasmussen has agreed to follow up with our clinic in 4 weeks. She was informed of the importance of frequent follow up visits to maximize her success with intensive lifestyle modifications for her multiple health conditions.  I, Doreene Nest, am acting as transcriptionist for Dennard Nip, MD  I have reviewed the above documentation for accuracy and completeness, and I agree with the above. -Dennard Nip, MD    OBESITY BEHAVIORAL INTERVENTION VISIT  Today's visit was # 12 out of 22.  Starting weight: 212 lbs Starting date: 06/03/16 Today's weight : 189 lbs Today's date: 01/19/2017 Total lbs lost to date: 23 (Patients must lose 7 lbs in the first 6 months to continue with counseling)   ASK: We discussed the diagnosis of obesity with Kristin Rasmussen today and Kristin Rasmussen agreed to give [place] permission to discuss obesity behavioral modification therapy today.  ASSESS: Kristin Rasmussen has the diagnosis of obesity and her BMI today is 33.49 Kristin Rasmussen is in the action stage of change   ADVISE: Kristin Rasmussen was educated on the multiple health risks of obesity as well as the benefit of weight loss to improve her health. She was advised of the need for long term treatment and the importance of lifestyle modifications.  AGREE: Multiple dietary modification options and treatment options were discussed and  Kimberlynn agreed  to keep a food journal with 400 to 500 calories and 35 grams of protein at supper daily and follow the Category 2 plan We discussed the following Behavioral Modification Strategies today: dealing with family or coworker sabotage, travel eating strategies, celebration eating strategies and holiday eating strategies

## 2017-02-17 ENCOUNTER — Other Ambulatory Visit: Payer: Self-pay | Admitting: Internal Medicine

## 2017-02-17 ENCOUNTER — Telehealth (INDEPENDENT_AMBULATORY_CARE_PROVIDER_SITE_OTHER): Payer: Self-pay | Admitting: Family Medicine

## 2017-02-17 NOTE — Telephone Encounter (Signed)
Pt has appt Monday the 21st at 8 am, she wants to know if she needs to fast.  It's not often that she can come in early.  [phone]

## 2017-02-17 NOTE — Telephone Encounter (Signed)
Yes please have her fast for her next appt. Per Dr Leafy Ro. April, [place]

## 2017-02-23 ENCOUNTER — Ambulatory Visit (INDEPENDENT_AMBULATORY_CARE_PROVIDER_SITE_OTHER): Payer: PRIVATE HEALTH INSURANCE | Admitting: Family Medicine

## 2017-02-23 VITALS — BP 105/68 | HR 70 | Temp 97.7°F | Ht 63.0 in | Wt 191.0 lb

## 2017-02-23 DIAGNOSIS — E7849 Other hyperlipidemia: Secondary | ICD-10-CM

## 2017-02-23 DIAGNOSIS — E8881 Metabolic syndrome: Secondary | ICD-10-CM | POA: Diagnosis not present

## 2017-02-23 DIAGNOSIS — E669 Obesity, unspecified: Secondary | ICD-10-CM

## 2017-02-23 DIAGNOSIS — Z6833 Body mass index (BMI) 33.0-33.9, adult: Secondary | ICD-10-CM | POA: Diagnosis not present

## 2017-02-23 DIAGNOSIS — Z9189 Other specified personal risk factors, not elsewhere classified: Secondary | ICD-10-CM | POA: Diagnosis not present

## 2017-02-23 DIAGNOSIS — E559 Vitamin D deficiency, unspecified: Secondary | ICD-10-CM | POA: Diagnosis not present

## 2017-02-23 NOTE — Progress Notes (Signed)
Office: [phone]  /  Fax: [phone]   HPI:   Chief Complaint: OBESITY Kristin Rasmussen is here to discuss her progress with her obesity treatment plan. She is on the lower carbohydrate, vegetable and lean protein rich diet plan and is following her eating plan approximately 90 % of the time. She states she is exercising 0 minutes 0 times per week. Kristin Rasmussen's last visit before Christmas, she gained slightly but she is ready to get back on track.  Her weight is 191 lb (86.6 kg) today and has gained 2 pounds since her last visit. She has lost 21 lbs since starting treatment with [place].  Insulin Resistance Kristin Rasmussen has a diagnosis of insulin resistance based on her elevated fasting insulin level >5. Although Kristin Rasmussen's blood glucose readings are still under good control, insulin resistance puts her at greater risk of metabolic syndrome and diabetes. She is doing well with diet and exercise to decrease, she is not on metformin and she denies hypoglycemia.  Vitamin D deficiency Kristin Rasmussen has a diagnosis of vitamin D deficiency. She is on OTC Vit D now, she stopped prescription Vit D after level increased to 73. She denies nausea, vomiting or muscle weakness.  At risk for osteopenia and osteoporosis Kristin Rasmussen is at higher risk of osteopenia and osteoporosis due to vitamin D deficiency.   Hyperlipidemia Kristin Rasmussen has hyperlipidemia and has been trying to improve her cholesterol levels with intensive lifestyle modification including a low saturated fat diet, exercise and weight loss. She is on pravastatin and she is due for labs. She denies any chest pain, claudication or myalgias.  ALLERGIES: No Known Allergies  MEDICATIONS: Current Outpatient Medications on File Prior to Visit  Medication Sig Dispense Refill  . acetaminophen (TYLENOL) 500 MG tablet Take 500 mg by mouth every 6 (six) hours as needed.    . ALPRAZolam (XANAX) 0.25 MG tablet Take 12.5 mg by mouth 2 (two) times daily.      . Ascorbic Acid (VITAMIN  C) 500 MG CAPS Take by mouth.    . calcium citrate-vitamin D (CITRACAL+D) 315-200 MG-UNIT tablet Take 1 tablet by mouth 2 (two) times daily.    . cetirizine (ZYRTEC) 10 MG tablet Take 10 mg by mouth daily.    . diphenhydrAMINE (BENADRYL) 25 mg capsule Take 25 mg by mouth every 6 (six) hours as needed.    . docusate sodium (COLACE) 100 MG capsule Take 100 mg by mouth daily as needed for mild constipation.    [name] escitalopram (LEXAPRO) 20 MG tablet Take 20 mg by mouth daily.    [name] esomeprazole (NEXIUM) 40 MG capsule TAKE 1 CAPSULE BY MOUTH ONCE DAILY 30 capsule 5  . estradiol (VIVELLE-DOT) 0.075 MG/24HR Apply 1 patch externally 2 times a week  5  . fish oil-omega-3 fatty acids 1000 MG capsule Take 1,200 mg by mouth daily.     . magnesium hydroxide (MILK OF MAGNESIA) 400 MG/5ML suspension Take by mouth daily as needed for mild constipation.    . Multiple Vitamin (MULTIVITAMIN) tablet Take 1 tablet by mouth daily.      [name] oxybutynin (DITROPAN-XL) 5 MG 24 hr tablet Take 5 mg by mouth at bedtime.    . pravastatin (PRAVACHOL) 20 MG tablet Take 1 tablet (20 mg total) by mouth daily. 90 tablet 1   No current facility-administered medications on file prior to visit.     PAST MEDICAL HISTORY: Past Medical History:  Diagnosis Date  . Anemia 2004   HCT 34.7  . Anxiety  follows with psyc for same  . Back pain   . Basal cell cancer    x3; Dr Tonia Brooms  . Bladder leak   . Chest pain 06/2010   Costochondritis  . Diverticulosis   . Gallbladder problem   . GERD (gastroesophageal reflux disease)   . Hyperlipidemia   . IBS (irritable bowel syndrome)   . Melanoma ([place]) 2006   x1  . PCOS (polycystic ovarian syndrome)   . Plantar fasciitis 08/13/2011   Left is significant and RT is mild by [place] criteria   . Spasm of esophagus   . Swelling   . Thrombocytopenia ([place]) 2002   133,000 platelets    PAST SURGICAL HISTORY: Past Surgical History:  Procedure Laterality Date  . CHOLECYSTECTOMY  2002   [facility]  .  COLONOSCOPY    . MELANOMA EXCISION     LUE 2006  [facility]; Derm : Dr Crista Luria  . SHOULDER SURGERY     Left  . TONSILLECTOMY    . TOTAL ABDOMINAL HYSTERECTOMY  1991   abnormal PAP hx    SOCIAL HISTORY: Social History   Tobacco Use  . Smoking status: Never Smoker  . Smokeless tobacco: Never Used  Substance Use Topics  . Alcohol use: Yes    Comment: rarely, only holiday's  . Drug use: No    FAMILY HISTORY: Family History  Problem Relation Age of Onset  . Hypertension Father   . Colon polyps Father   . GER disease Father   . Skin cancer Father   . Ovarian cancer Paternal Aunt   . Diabetes Mother   . Hypertension Mother   . Obesity Mother   . Heart attack Maternal Grandmother 76  . Diabetes Maternal Grandmother   . Heart attack Maternal Grandfather 69  . Stroke Maternal Grandfather 79  . Colon cancer Paternal Grandfather        unsure age    ROS: Review of Systems  Constitutional: Negative for weight loss.  Cardiovascular: Negative for chest pain and claudication.  Gastrointestinal: Negative for abdominal pain and vomiting.  Musculoskeletal: Negative for myalgias.       Negative muscle weakness  Endo/Heme/Allergies:       Negative hypoglycemia    PHYSICAL EXAM: Blood pressure 105/68, pulse 70, temperature 97.7 F (36.5 C), temperature source Oral, height 5\' 3"  (1.6 m), weight 191 lb (86.6 kg), SpO2 96 %. Body mass index is 33.83 kg/m. Physical Exam  Constitutional: She is oriented to person, place, and time. She appears well-developed and well-nourished.  Cardiovascular: Normal rate.  Pulmonary/Chest: Effort normal.  Musculoskeletal: Normal range of motion.  Neurological: She is oriented to person, place, and time.  Skin: Skin is warm and dry.  Psychiatric: She has a normal mood and affect. Her behavior is normal.  Vitals reviewed.   RECENT LABS AND TESTS: BMET    Component Value Date/Time   NA 141 10/08/2016 0941   K 4.4 10/08/2016 0941   CL 99  10/08/2016 0941   CO2 27 10/08/2016 0941   GLUCOSE 89 10/08/2016 0941   GLUCOSE 96 12/04/2015 0935   BUN 11 10/08/2016 0941   CREATININE 0.55 (L) 10/08/2016 0941   CALCIUM 9.5 10/08/2016 0941   GFRNONAA 105 10/08/2016 0941   GFRAA 121 10/08/2016 0941   Lab Results  Component Value Date   HGBA1C 5.4 10/08/2016   HGBA1C 5.4 06/03/2016   HGBA1C 5.6 12/04/2015   Lab Results  Component Value Date   INSULIN 5.1 10/08/2016   INSULIN  7.4 06/03/2016   CBC    Component Value Date/Time   WBC 7.6 06/03/2016 1253   WBC 8.1 12/04/2015 0935   RBC 4.93 06/03/2016 1253   RBC 5.52 (H) 12/04/2015 0935   HGB 14.6 06/03/2016 1253   HGB 14.2 03/29/2010 1506   HCT 43.3 06/03/2016 1253   HCT 41.3 03/29/2010 1506   PLT 225.0 12/04/2015 0935   PLT 221 03/29/2010 1506   MCV 88 06/03/2016 1253   MCV 88.4 03/29/2010 1506   MCH 29.6 06/03/2016 1253   MCH 29.8 06/28/2010 0355   MCHC 33.7 06/03/2016 1253   MCHC 33.9 12/04/2015 0935   RDW 14.5 06/03/2016 1253   RDW 13.6 03/29/2010 1506   LYMPHSABS 1.5 06/03/2016 1253   LYMPHSABS 1.6 03/29/2010 1506   MONOABS 0.5 12/04/2015 0935   MONOABS 0.4 03/29/2010 1506   EOSABS 0.2 06/03/2016 1253   BASOSABS 0.0 06/03/2016 1253   BASOSABS 0.1 03/29/2010 1506   Iron/TIBC/Ferritin/ %Sat    Component Value Date/Time   IRON 65 01/22/2009 0000   IRONPCTSAT 21.8 01/22/2009 0000   Lipid Panel     Component Value Date/Time   CHOL 168 10/08/2016 0941   TRIG 117 10/08/2016 0941   HDL 42 10/08/2016 0941   CHOLHDL 5 12/04/2015 0935   VLDL 38.8 12/04/2015 0935   LDLCALC 103 (H) 10/08/2016 0941   LDLDIRECT 156.9 01/20/2008 0000   Hepatic Function Panel     Component Value Date/Time   PROT 6.8 10/08/2016 0941   ALBUMIN 4.4 10/08/2016 0941   AST 12 10/08/2016 0941   ALT 11 10/08/2016 0941   ALKPHOS 54 10/08/2016 0941   BILITOT 0.3 10/08/2016 0941   BILIDIR 0.1 12/01/2012 1035   IBILI NOT CALCULATED 06/28/2010 0355      Component Value Date/Time    TSH 1.550 06/03/2016 1253   TSH 2.27 12/04/2015 0935   TSH 1.97 12/04/2014 0958  Results for GENEVIE, ELMAN (MRN [id number]) as of 02/23/2017 08:48  Ref. Range 10/08/2016 09:41  Vitamin D, 25-Hydroxy Latest Ref Range: 30.0 - 100.0 ng/mL 73.3    ASSESSMENT AND PLAN: Insulin resistance - Plan: Comprehensive metabolic panel, Hemoglobin A1c, Insulin, random  Vitamin D deficiency - Plan: VITAMIN D 25 Hydroxy (Vit-D Deficiency, Fractures)  Other hyperlipidemia - Plan: Lipid Panel With LDL/HDL Ratio  At risk for osteoporosis  Class 1 obesity with serious comorbidity and body mass index (BMI) of 33.0 to 33.9 in adult, unspecified obesity type  PLAN:  Insulin Resistance Kristin Rasmussen will continue to work on weight loss, exercise, and decreasing simple carbohydrates in her diet to help decrease the risk of diabetes. We dicussed metformin including benefits and risks. She was informed that eating too many simple carbohydrates or too many calories at one sitting increases the likelihood of GI side effects. Kristin Rasmussen declined metformin for now and prescription was not written today. Kristin Rasmussen agreed to follow up with [place] as directed to monitor her progress. We will check labs and Kristin Rasmussen agrees to follow up with our clinic in 2 weeks.  Vitamin D Deficiency Kristin Rasmussen was informed that low vitamin D levels contributes to fatigue and are associated with obesity, breast, and colon cancer. Kristin Rasmussen agrees to continue taking OTC Vit D and will follow up for routine testing of vitamin D, at least 2-3 times per year. She was informed of the risk of over-replacement of vitamin D and agrees to not increase her dose unless she discusses this with [place] first. We will check labs and Kristin Rasmussen agrees to  follow up with our clinic in 2 weeks.  At risk for osteopenia and osteoporosis Kristin Rasmussen is at risk for osteopenia and osteoporsis due to her vitamin D deficiency. She was encouraged to take her vitamin D and follow her higher calcium diet and  increase strengthening exercise to help strengthen her bones and decrease her risk of osteopenia and osteoporosis.  Hyperlipidemia Kristin Rasmussen was informed of the American Heart Association Guidelines emphasizing intensive lifestyle modifications as the first line treatment for hyperlipidemia. We discussed many lifestyle modifications today in depth, and Kristin Rasmussen will continue to work on decreasing saturated fats such as fatty red meat, butter and many fried foods. She will also increase vegetables and lean protein in her diet and continue to work on diet, exercise, and weight loss efforts. Kristin Rasmussen agrees to continue taking her medications as prescribed. We will check labs and Kristin Rasmussen agrees to follow up with our clinic in 2 weeks.  Obesity Kristin Rasmussen is currently in the action stage of change. As such, her goal is to continue with weight loss efforts She has agreed to follow the Category 2 plan strict Kristin Rasmussen has been instructed to work up to a goal of 150 minutes of combined cardio and strengthening exercise per week for weight loss and overall health benefits. We discussed the following Behavioral Modification Strategies today: increasing lean protein intake, decrease eating out and work on meal planning and easy cooking plans   Kristin Rasmussen has agreed to follow up with our clinic in 2 weeks. She was informed of the importance of frequent follow up visits to maximize her success with intensive lifestyle modifications for her multiple health conditions.   OBESITY BEHAVIORAL INTERVENTION VISIT  Today's visit was # 13 out of 22.  Starting weight: 212 lbs Starting date: 06/03/16 Today's weight : 191 lbs  Today's date: 02/23/2017 Total lbs lost to date: 21 (Patients must lose 7 lbs in the first 6 months to continue with counseling)   ASK: We discussed the diagnosis of obesity with Kristin Rasmussen today and Kristin Rasmussen agreed to give [place] permission to discuss obesity behavioral modification therapy  today.  ASSESS: Deazia has the diagnosis of obesity and her BMI today is 33.84 Blondina is in the action stage of change   ADVISE: Cheryll was educated on the multiple health risks of obesity as well as the benefit of weight loss to improve her health. She was advised of the need for long term treatment and the importance of lifestyle modifications.  AGREE: Multiple dietary modification options and treatment options were discussed and  Ilah agreed to the above obesity treatment plan.  I, Trixie Dredge, am acting as transcriptionist for Dennard Nip, MD  I have reviewed the above documentation for accuracy and completeness, and I agree with the above. -Dennard Nip, MD

## 2017-02-24 LAB — LIPID PANEL WITH LDL/HDL RATIO
CHOLESTEROL TOTAL: 203 mg/dL — AB (ref 100–199)
HDL: 44 mg/dL (ref >39)
LDL CALC: 132 mg/dL — AB (ref 0–99)
LDL/HDL RATIO: 3 ratio (ref 0.0–3.2)
TRIGLYCERIDES: 136 mg/dL (ref 0–149)
VLDL Cholesterol Cal: 27 mg/dL (ref 5–40)

## 2017-02-24 LAB — COMPREHENSIVE METABOLIC PANEL
ALBUMIN: 4.3 g/dL (ref 3.5–5.5)
ALT: 11 IU/L (ref 0–32)
AST: 12 IU/L (ref 0–40)
Albumin/Globulin Ratio: 1.8 (ref 1.2–2.2)
Alkaline Phosphatase: 47 IU/L (ref 39–117)
BUN / CREAT RATIO: 23 (ref 9–23)
BUN: 14 mg/dL (ref 6–24)
Bilirubin Total: 0.3 mg/dL (ref 0.0–1.2)
CALCIUM: 9.6 mg/dL (ref 8.7–10.2)
CO2: 28 mmol/L (ref 20–29)
CREATININE: 0.6 mg/dL (ref 0.57–1.00)
Chloride: 100 mmol/L (ref 96–106)
GFR calc Af Amer: 118 mL/min/{1.73_m2} (ref >59)
GFR, EST NON AFRICAN AMERICAN: 102 mL/min/{1.73_m2} (ref >59)
GLOBULIN, TOTAL: 2.4 g/dL (ref 1.5–4.5)
GLUCOSE: 96 mg/dL (ref 65–99)
Potassium: 4.6 mmol/L (ref 3.5–5.2)
Sodium: 140 mmol/L (ref 134–144)
Total Protein: 6.7 g/dL (ref 6.0–8.5)

## 2017-02-24 LAB — HEMOGLOBIN A1C
Est. average glucose Bld gHb Est-mCnc: 111 mg/dL
Hgb A1c MFr Bld: 5.5 % (ref 4.8–5.6)

## 2017-02-24 LAB — INSULIN, RANDOM: INSULIN: 4.8 u[IU]/mL (ref 2.6–24.9)

## 2017-02-24 LAB — VITAMIN D 25 HYDROXY (VIT D DEFICIENCY, FRACTURES): Vit D, 25-Hydroxy: 32.3 ng/mL (ref 30.0–100.0)

## 2017-03-16 ENCOUNTER — Ambulatory Visit (INDEPENDENT_AMBULATORY_CARE_PROVIDER_SITE_OTHER): Payer: PRIVATE HEALTH INSURANCE | Admitting: Family Medicine

## 2017-03-16 VITALS — BP 102/68 | HR 75 | Temp 97.9°F | Ht 63.0 in | Wt 190.0 lb

## 2017-03-16 DIAGNOSIS — E559 Vitamin D deficiency, unspecified: Secondary | ICD-10-CM

## 2017-03-16 DIAGNOSIS — E669 Obesity, unspecified: Secondary | ICD-10-CM | POA: Diagnosis not present

## 2017-03-16 DIAGNOSIS — Z6833 Body mass index (BMI) 33.0-33.9, adult: Secondary | ICD-10-CM | POA: Diagnosis not present

## 2017-03-16 DIAGNOSIS — F418 Other specified anxiety disorders: Secondary | ICD-10-CM | POA: Diagnosis not present

## 2017-03-16 MED ORDER — VITAMIN D (ERGOCALCIFEROL) 1.25 MG (50000 UNIT) PO CAPS: 50000.0000 [IU] | ORAL_CAPSULE | ORAL | 0 refills | Status: DC

## 2017-03-16 NOTE — Progress Notes (Signed)
Office: [phone]  /  Fax: [phone]   HPI:   Chief Complaint: OBESITY Kristin Rasmussen is here to discuss her progress with her obesity treatment plan. She is on the Category 2 plan and is following her eating plan approximately 85 % of the time. She states she is exercising 0 minutes 0 times per week. Kristin Rasmussen continues to lose weight but has had increased stress at home and at work and is noticing increased emotional eating and cravings. Worse in the day time versus at night.  Her weight is 190 lb (86.2 kg) today and has had a weight loss of 1 pound over a period of 3 weeks since her last visit. She has lost 22 lbs since starting treatment with [place].  Vitamin D deficiency Kristin Rasmussen has a diagnosis of vitamin D deficiency. She is off prescription Vit D and level has now dropped too low. She notes increase fatigue and denies nausea, vomiting or muscle weakness.  Depression with Anxiety Kristin Rasmussen is on Lexapro and doing well overall buts has struggled with some emotional eating and using food for comfort to the extent that it is negatively impacting her health. She often snacks when she is not hungry. Kristin Rasmussen sometimes feels she is out of control and then feels guilty that she made poor food choices. She has been working on behavior modification techniques to help reduce her emotional eating and has been somewhat successful. She shows no sign of suicidal or homicidal ideations.  Depression screen [facility] 2/9 06/03/2016 03/24/2016 09/19/2014 08/01/2014 01/25/2014  Decreased Interest 1 0 0 0 0  Down, Depressed, Hopeless 1 0 0 0 0  PHQ - 2 Score 2 0 0 0 0  Altered sleeping 1 - - - -  Tired, decreased energy 1 - - - -  Change in appetite 1 - - - -  Feeling bad or failure about yourself  1 - - - -  Trouble concentrating 0 - - - -  Moving slowly or fidgety/restless 0 - - - -  Suicidal thoughts 0 - - - -  PHQ-9 Score 6 - - - -   ALLERGIES: No Known Allergies  MEDICATIONS: Current Outpatient Medications on File  Prior to Visit  Medication Sig Dispense Refill  . acetaminophen (TYLENOL) 500 MG tablet Take 500 mg by mouth every 6 (six) hours as needed.    . ALPRAZolam (XANAX) 0.25 MG tablet Take 12.5 mg by mouth 2 (two) times daily.      . Ascorbic Acid (VITAMIN C) 500 MG CAPS Take by mouth.    . calcium citrate-vitamin D (CITRACAL+D) 315-200 MG-UNIT tablet Take 1 tablet by mouth 2 (two) times daily.    . cetirizine (ZYRTEC) 10 MG tablet Take 10 mg by mouth daily.    . diphenhydrAMINE (BENADRYL) 25 mg capsule Take 25 mg by mouth every 6 (six) hours as needed.    . docusate sodium (COLACE) 100 MG capsule Take 100 mg by mouth daily as needed for mild constipation.    [name] escitalopram (LEXAPRO) 20 MG tablet Take 20 mg by mouth daily.    [name] esomeprazole (NEXIUM) 40 MG capsule TAKE 1 CAPSULE BY MOUTH ONCE DAILY 30 capsule 5  . estradiol (VIVELLE-DOT) 0.075 MG/24HR Apply 1 patch externally 2 times a week  5  . fish oil-omega-3 fatty acids 1000 MG capsule Take 1,200 mg by mouth daily.     . magnesium hydroxide (MILK OF MAGNESIA) 400 MG/5ML suspension Take by mouth daily as needed for mild constipation.    [name]  Multiple Vitamin (MULTIVITAMIN) tablet Take 1 tablet by mouth daily.      [name] oxybutynin (DITROPAN-XL) 5 MG 24 hr tablet Take 5 mg by mouth at bedtime.    . pravastatin (PRAVACHOL) 20 MG tablet Take 1 tablet (20 mg total) by mouth daily. 90 tablet 1   No current facility-administered medications on file prior to visit.     PAST MEDICAL HISTORY: Past Medical History:  Diagnosis Date  . Anemia 2004   HCT 34.7  . Anxiety    follows with psyc for same  . Back pain   . Basal cell cancer    x3; Dr Tonia Brooms  . Bladder leak   . Chest pain 06/2010   Costochondritis  . Diverticulosis   . Gallbladder problem   . GERD (gastroesophageal reflux disease)   . Hyperlipidemia   . IBS (irritable bowel syndrome)   . Melanoma ([place]) 2006   x1  . PCOS (polycystic ovarian syndrome)   . Plantar fasciitis 08/13/2011   Left  is significant and RT is mild by [place] criteria   . Spasm of esophagus   . Swelling   . Thrombocytopenia ([place]) 2002   133,000 platelets    PAST SURGICAL HISTORY: Past Surgical History:  Procedure Laterality Date  . CHOLECYSTECTOMY  2002   [facility]  . COLONOSCOPY    . MELANOMA EXCISION     LUE 2006  [facility]; Derm : Dr Crista Luria  . SHOULDER SURGERY     Left  . TONSILLECTOMY    . TOTAL ABDOMINAL HYSTERECTOMY  1991   abnormal PAP hx    SOCIAL HISTORY: Social History   Tobacco Use  . Smoking status: Never Smoker  . Smokeless tobacco: Never Used  Substance Use Topics  . Alcohol use: Yes    Comment: rarely, only holiday's  . Drug use: No    FAMILY HISTORY: Family History  Problem Relation Age of Onset  . Hypertension Father   . Colon polyps Father   . GER disease Father   . Skin cancer Father   . Ovarian cancer Paternal Aunt   . Diabetes Mother   . Hypertension Mother   . Obesity Mother   . Heart attack Maternal Grandmother 76  . Diabetes Maternal Grandmother   . Heart attack Maternal Grandfather 69  . Stroke Maternal Grandfather 12  . Colon cancer Paternal Grandfather        unsure age    ROS: Review of Systems  Constitutional: Positive for malaise/fatigue and weight loss.  Gastrointestinal: Negative for nausea and vomiting.  Musculoskeletal:       Negative muscle weakness  Psychiatric/Behavioral: Positive for depression. Negative for suicidal ideas.    PHYSICAL EXAM: Blood pressure 102/68, pulse 75, temperature 97.9 F (36.6 C), temperature source Oral, height 5\' 3"  (1.6 m), weight 190 lb (86.2 kg), SpO2 96 %. Body mass index is 33.66 kg/m. Physical Exam  Constitutional: She is oriented to person, place, and time. She appears well-developed and well-nourished.  Cardiovascular: Normal rate.  Pulmonary/Chest: Effort normal.  Musculoskeletal: Normal range of motion.  Neurological: She is oriented to person, place, and time.  Skin: Skin is warm and dry.    Psychiatric: She has a normal mood and affect. Her behavior is normal.  Vitals reviewed.   RECENT LABS AND TESTS: BMET    Component Value Date/Time   NA 140 02/23/2017 0825   K 4.6 02/23/2017 0825   CL 100 02/23/2017 0825   CO2 28 02/23/2017 0825   GLUCOSE  96 02/23/2017 0825   GLUCOSE 96 12/04/2015 0935   BUN 14 02/23/2017 0825   CREATININE 0.60 02/23/2017 0825   CALCIUM 9.6 02/23/2017 0825   GFRNONAA 102 02/23/2017 0825   GFRAA 118 02/23/2017 0825   Lab Results  Component Value Date   HGBA1C 5.5 02/23/2017   HGBA1C 5.4 10/08/2016   HGBA1C 5.4 06/03/2016   HGBA1C 5.6 12/04/2015   Lab Results  Component Value Date   INSULIN 4.8 02/23/2017   INSULIN 5.1 10/08/2016   INSULIN 7.4 06/03/2016   CBC    Component Value Date/Time   WBC 7.6 06/03/2016 1253   WBC 8.1 12/04/2015 0935   RBC 4.93 06/03/2016 1253   RBC 5.52 (H) 12/04/2015 0935   HGB 14.6 06/03/2016 1253   HGB 14.2 03/29/2010 1506   HCT 43.3 06/03/2016 1253   HCT 41.3 03/29/2010 1506   PLT 225.0 12/04/2015 0935   PLT 221 03/29/2010 1506   MCV 88 06/03/2016 1253   MCV 88.4 03/29/2010 1506   MCH 29.6 06/03/2016 1253   MCH 29.8 06/28/2010 0355   MCHC 33.7 06/03/2016 1253   MCHC 33.9 12/04/2015 0935   RDW 14.5 06/03/2016 1253   RDW 13.6 03/29/2010 1506   LYMPHSABS 1.5 06/03/2016 1253   LYMPHSABS 1.6 03/29/2010 1506   MONOABS 0.5 12/04/2015 0935   MONOABS 0.4 03/29/2010 1506   EOSABS 0.2 06/03/2016 1253   BASOSABS 0.0 06/03/2016 1253   BASOSABS 0.1 03/29/2010 1506   Iron/TIBC/Ferritin/ %Sat    Component Value Date/Time   IRON 65 01/22/2009 0000   IRONPCTSAT 21.8 01/22/2009 0000   Lipid Panel     Component Value Date/Time   CHOL 203 (H) 02/23/2017 0825   TRIG 136 02/23/2017 0825   HDL 44 02/23/2017 0825   CHOLHDL 5 12/04/2015 0935   VLDL 38.8 12/04/2015 0935   LDLCALC 132 (H) 02/23/2017 0825   LDLDIRECT 156.9 01/20/2008 0000   Hepatic Function Panel     Component Value Date/Time   PROT  6.7 02/23/2017 0825   ALBUMIN 4.3 02/23/2017 0825   AST 12 02/23/2017 0825   ALT 11 02/23/2017 0825   ALKPHOS 47 02/23/2017 0825   BILITOT 0.3 02/23/2017 0825   BILIDIR 0.1 12/01/2012 1035   IBILI NOT CALCULATED 06/28/2010 0355      Component Value Date/Time   TSH 1.550 06/03/2016 1253   TSH 2.27 12/04/2015 0935   TSH 1.97 12/04/2014 0958  Results for ONETHA, GAFFEY (MRN [id number]) as of 03/16/2017 16:43  Ref. Range 02/23/2017 08:25  Vitamin D, 25-Hydroxy Latest Ref Range: 30.0 - 100.0 ng/mL 32.3    ASSESSMENT AND PLAN: Depression with anxiety  Vitamin D deficiency - Plan: Vitamin D, Ergocalciferol, (DRISDOL) 50000 units CAPS capsule  Class 1 obesity with serious comorbidity and body mass index (BMI) of 33.0 to 33.9 in adult, unspecified obesity type  PLAN:  Vitamin D Deficiency Kristin Rasmussen was informed that low vitamin D levels contributes to fatigue and are associated with obesity, breast, and colon cancer. Kristin Rasmussen agrees to restart prescription Vit D @50 ,000 IU every week #4 with no refills and then plan to decrease to every other week thereafter. She will follow up for routine testing of vitamin D, at least 2-3 times per year. She was informed of the risk of over-replacement of vitamin D and agrees to not increase her dose unless she discusses this with [place] first. Shams agrees to follow up with our clinic in 3 to 4 weeks.  Depression with Anxiety We discussed behavior  modification techniques today to help Kristin Rasmussen deal with her depression. We discussed cognitive behavioral therapy to help decrease emotional eating. Kristin Rasmussen agrees to continue Lexapro as prescribed and will monitor closely. Kristin Rasmussen agrees to follow up with our clinic in 3 to 4 weeks.   Obesity Kristin Rasmussen is currently in the action stage of change. As such, her goal is to continue with weight loss efforts She has agreed to follow the Category 2 plan Kristin Rasmussen has been instructed to work up to a goal of 150 minutes of combined  cardio and strengthening exercise per week for weight loss and overall health benefits. We discussed the following Behavioral Modification Strategies today: increasing lean protein intake and emotional eating strategies   Kristin Rasmussen has agreed to follow up with our clinic in 3 to 4 weeks. She was informed of the importance of frequent follow up visits to maximize her success with intensive lifestyle modifications for her multiple health conditions.   OBESITY BEHAVIORAL INTERVENTION VISIT  Today's visit was # 14 out of 22.  Starting weight: 212 lbs Starting date: 06/03/16 Today's weight : 190 lbs  Today's date: 03/16/2017 Total lbs lost to date: 22 (Patients must lose 7 lbs in the first 6 months to continue with counseling)   ASK: We discussed the diagnosis of obesity with Kristin Rasmussen today and Kristin Rasmussen agreed to give [place] permission to discuss obesity behavioral modification therapy today.  ASSESS: Kristin Rasmussen has the diagnosis of obesity and her BMI today is 33.67 Kristin Rasmussen is in the action stage of change   ADVISE: Kristin Rasmussen was educated on the multiple health risks of obesity as well as the benefit of weight loss to improve her health. She was advised of the need for long term treatment and the importance of lifestyle modifications.  AGREE: Multiple dietary modification options and treatment options were discussed and  Kristin Rasmussen agreed to the above obesity treatment plan.  I, Trixie Dredge, am acting as transcriptionist for Dennard Nip, MD  I have reviewed the above documentation for accuracy and completeness, and I agree with the above. -Dennard Nip, MD

## 2017-03-22 IMAGING — MR MR SHOULDER*L* W/O CM
5 series · 34 of 40 positions shown · non-contrast
Comparison: Plain films left shoulder 03/24/2016.

CLINICAL DATA: Left shoulder pain, weakness and numbness in the
left arm and hand since 9140, worsening. No known injury.

EXAM:
MRI OF THE LEFT SHOULDER WITHOUT CONTRAST
TECHNIQUE: Multiplanar, multisequence MR imaging of the shoulder was performed.
No intravenous contrast was administered.

[Series 3: T2 fat-sat · oblique · 4.0mm · 0.62mm/px · 8 of 22 slices shown (1 of 3)]
[im 1/22]
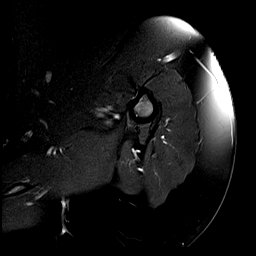
[im 4/22]
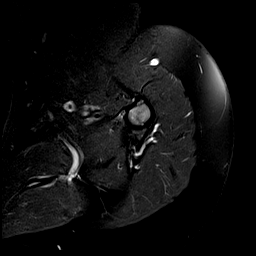
[im 7/22]
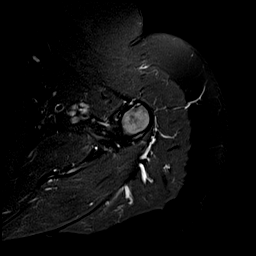
[im 10/22]
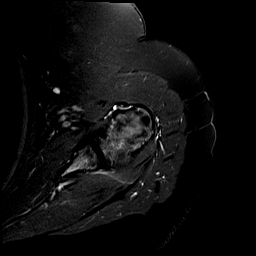
[im 13/22]
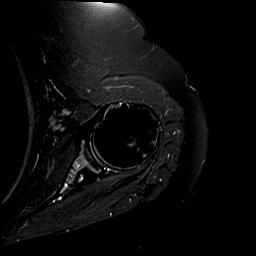
[im 16/22]
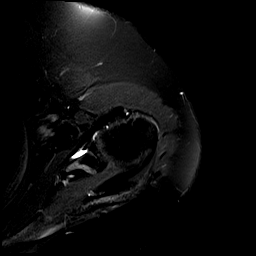
[im 19/22]
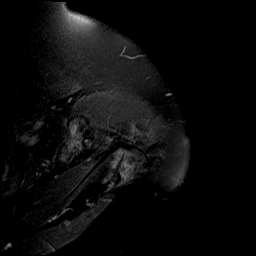
[im 22/22]
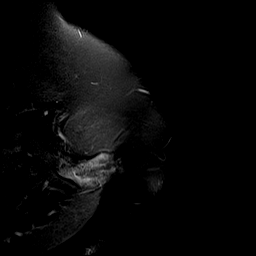

[Series 4: T2 fat-sat · oblique · 4.0mm · 0.62mm/px · 8 of 20 slices shown (2 of 3)]
[im 1/20]
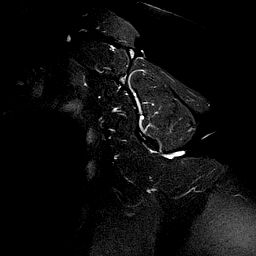
[im 3/20]
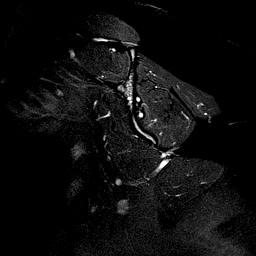
[im 6/20]
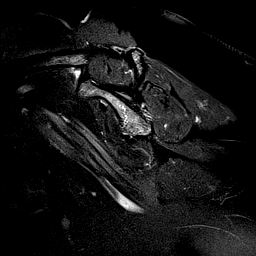
[im 9/20]
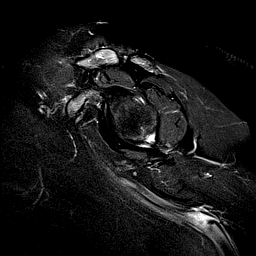
[im 11/20]
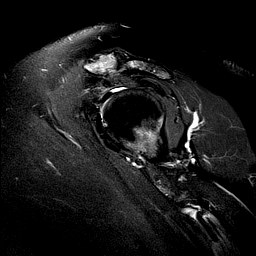
[im 14/20]
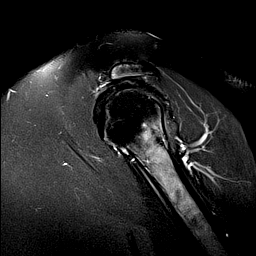
[im 17/20]
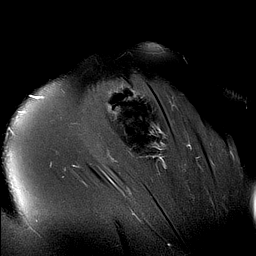
[im 20/20]
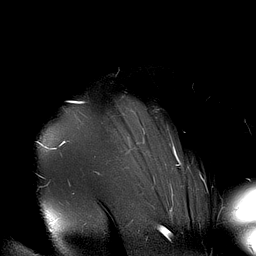

[Series 5: T1 · oblique · 4.0mm · 0.25mm/px · 2 of 20 slices shown]
[im 1/20]
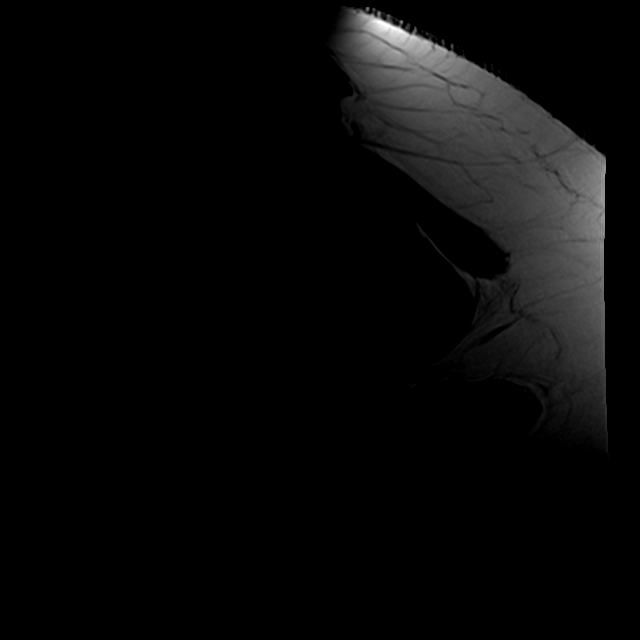
[im 3/20]
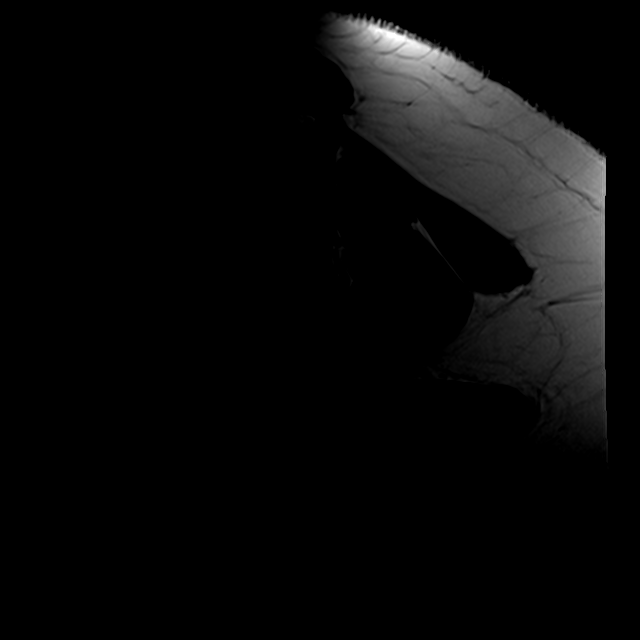

[Series 6: T2 fat-sat · oblique · 4.0mm · 0.31mm/px · 8 of 20 slices shown (3 of 3)]
[im 1/20]
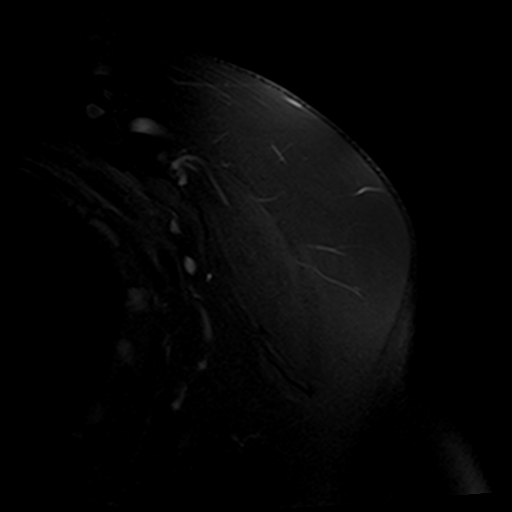
[im 3/20]
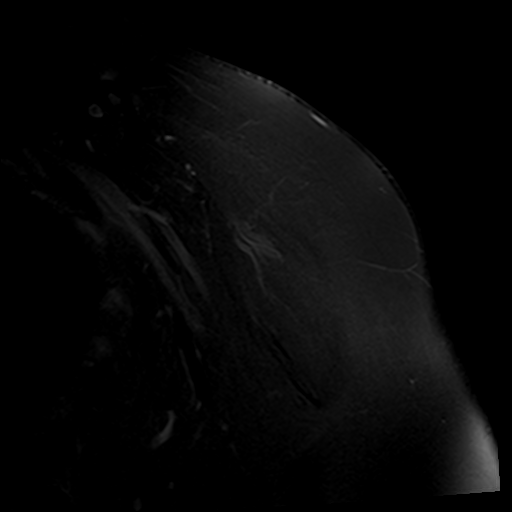
[im 6/20]
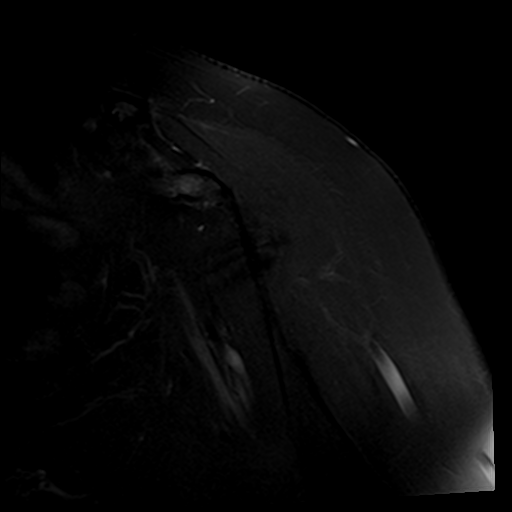
[im 9/20]
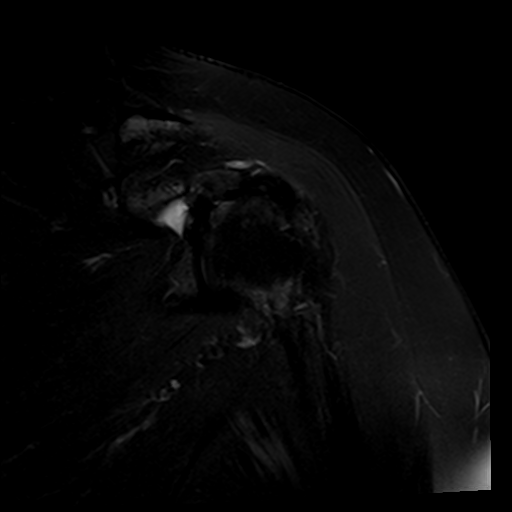
[im 11/20]
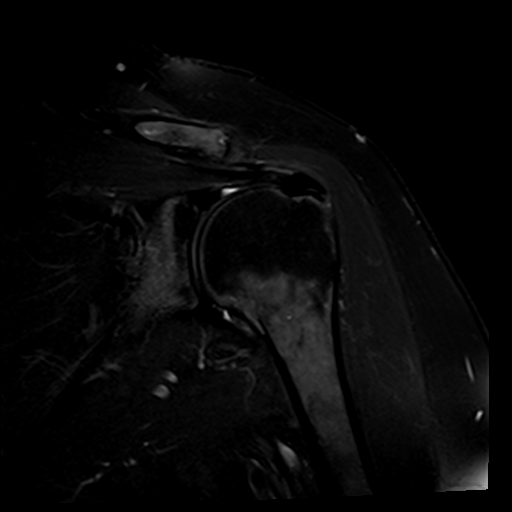
[im 14/20]
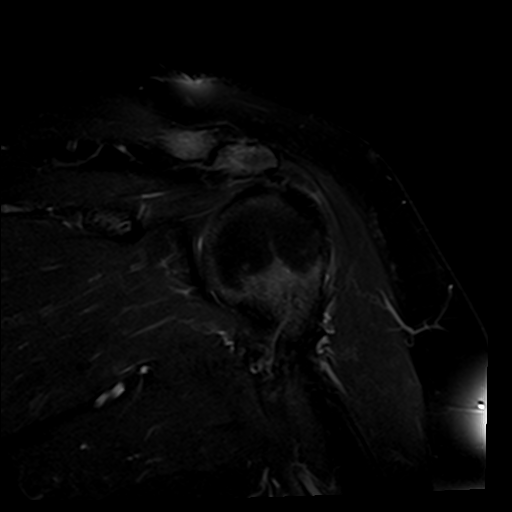
[im 17/20]
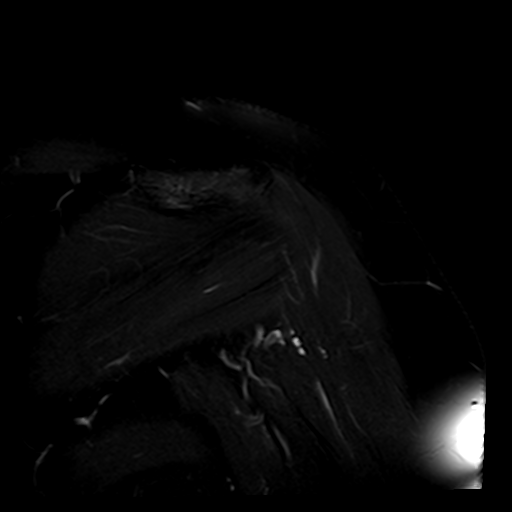
[im 20/20]
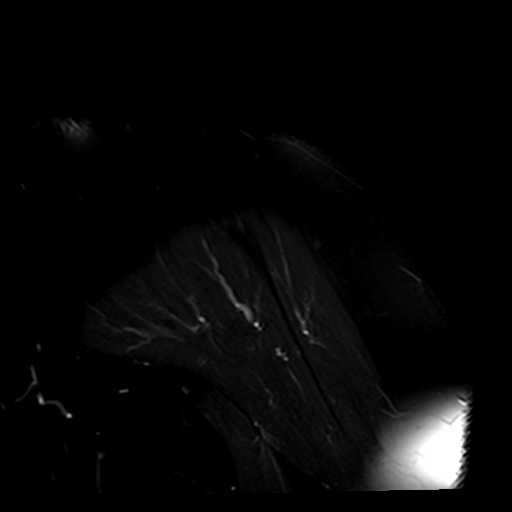

[Series 7: PD · oblique · 4.0mm · 0.50mm/px · 8 of 20 slices shown]
[im 1/20]
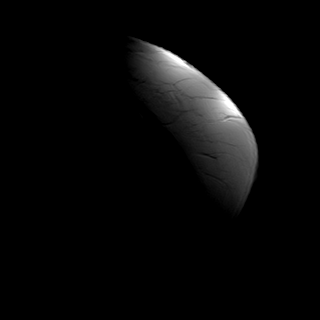
[im 3/20]
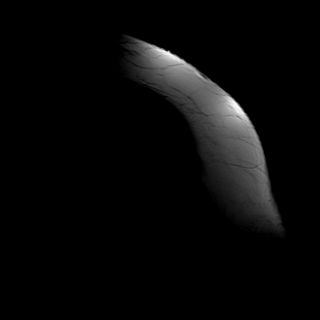
[im 6/20]
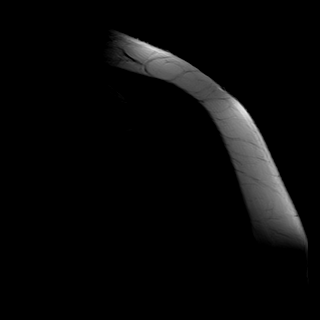
[im 9/20]
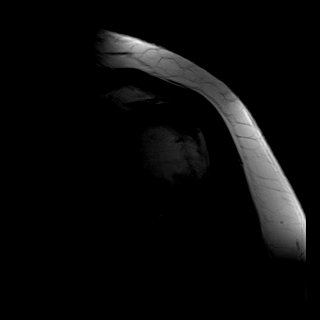
[im 11/20]
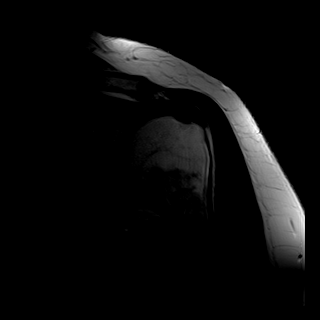
[im 14/20]
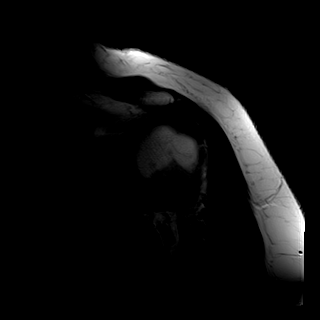
[im 17/20]
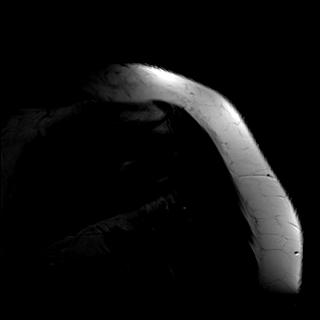
[im 20/20]
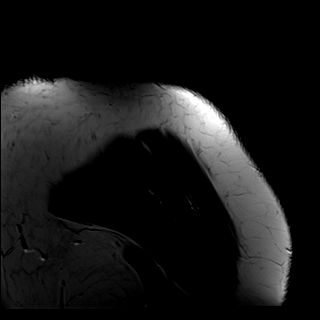

[34 of 40 positions shown; findings below may reference images not displayed]

FINDINGS: Rotator cuff: Hypointensity in the distal supraspinatus is
consistent with calcific tendinopathy as seen on prior plain films.
The rotator cuff is intact.

Muscles:  Intact and normal in appearance.

Biceps long head:  Intact and normal in appearance.

Acromioclavicular Joint: Moderate degenerative disease is
identified. Type 2 acromion. No subacromial/subdeltoid bursal fluid.

Glenohumeral Joint: Appears normal.

Labrum: Intact. Punctate focus of T2 hyperintensity in the posterior
labrum consistent with degeneration is noted.

Bones:  No fracture or worrisome lesion.

Other: None.
IMPRESSION: Supraspinatus and infraspinatus tendinopathy with calcification in
the supraspinatus identified as seen on prior plain films. Negative
for rotator cuff tear.

Moderate acromioclavicular osteoarthritis.

## 2017-04-09 ENCOUNTER — Ambulatory Visit (INDEPENDENT_AMBULATORY_CARE_PROVIDER_SITE_OTHER): Payer: PRIVATE HEALTH INSURANCE | Admitting: Family Medicine

## 2017-04-09 VITALS — BP 107/71 | HR 69 | Temp 98.1°F | Ht 63.0 in | Wt 188.0 lb

## 2017-04-09 DIAGNOSIS — E669 Obesity, unspecified: Secondary | ICD-10-CM

## 2017-04-09 DIAGNOSIS — E559 Vitamin D deficiency, unspecified: Secondary | ICD-10-CM

## 2017-04-09 DIAGNOSIS — Z6833 Body mass index (BMI) 33.0-33.9, adult: Secondary | ICD-10-CM

## 2017-04-09 MED ORDER — VITAMIN D (ERGOCALCIFEROL) 1.25 MG (50000 UNIT) PO CAPS
50000.0000 [IU] | ORAL_CAPSULE | ORAL | 0 refills | Status: DC
Start: 2017-04-09 — End: 2017-05-05

## 2017-04-09 NOTE — Progress Notes (Signed)
Office: [phone]  /  Fax: [phone]   HPI:   Chief Complaint: OBESITY Kristin Rasmussen is here to discuss her progress with her obesity treatment plan. She is on the Category 2 plan and is following her eating plan approximately 90 % of the time. She states she is exercising 0 minutes 0 times per week. [name] continues to do well with weight loss on the category 2 plan.and hunger is mostly controlled. [name] is still stress eating, especially with work stress. She is doing well making better snacking choices and planning her meals ahead of time. Her weight is 188 lb (85.3 kg) today and has had a weight loss of 2 pounds over a period of 3 weeks since her last visit. She has lost 24 lbs since starting treatment with [place].  Vitamin D deficiency [name] has a diagnosis of vitamin D deficiency. She is stable on vit D, but is not yet at goal. [name] denies nausea, vomiting or muscle weakness.   Ref. Range 02/23/2017 08:25  Vitamin D, 25-Hydroxy Latest Ref Range: 30.0 - 100.0 ng/mL 32.3   ALLERGIES: No Known Allergies  MEDICATIONS: Current Outpatient Medications on File Prior to Visit  Medication Sig Dispense Refill   acetaminophen (TYLENOL) 500 MG tablet Take 500 mg by mouth every 6 (six) hours as needed.     ALPRAZolam (XANAX) 0.25 MG tablet Take 12.5 mg by mouth 2 (two) times daily.       Ascorbic Acid (VITAMIN C) 500 MG CAPS Take by mouth.     calcium citrate-vitamin D (CITRACAL+D) 315-200 MG-UNIT tablet Take 1 tablet by mouth 2 (two) times daily.     cetirizine (ZYRTEC) 10 MG tablet Take 10 mg by mouth daily.     diphenhydrAMINE (BENADRYL) 25 mg capsule Take 25 mg by mouth every 6 (six) hours as needed.     docusate sodium (COLACE) 100 MG capsule Take 100 mg by mouth daily as needed for mild constipation.     escitalopram (LEXAPRO) 20 MG tablet Take 20 mg by mouth daily.     esomeprazole (NEXIUM) 40 MG capsule TAKE 1 CAPSULE BY MOUTH ONCE DAILY 30 capsule 5   estradiol  (VIVELLE-DOT) 0.075 MG/24HR Apply 1 patch externally 2 times a week  5   fish oil-omega-3 fatty acids 1000 MG capsule Take 1,200 mg by mouth daily.      magnesium hydroxide (MILK OF MAGNESIA) 400 MG/5ML suspension Take by mouth daily as needed for mild constipation.     Multiple Vitamin (MULTIVITAMIN) tablet Take 1 tablet by mouth daily.       oxybutynin (DITROPAN-XL) 5 MG 24 hr tablet Take 5 mg by mouth at bedtime.     pravastatin (PRAVACHOL) 20 MG tablet Take 1 tablet (20 mg total) by mouth daily. 90 tablet 1   Vitamin D, Ergocalciferol, (DRISDOL) 50000 units CAPS capsule Take 1 capsule (50,000 Units total) by mouth every 7 (seven) days. 4 capsule 0   No current facility-administered medications on file prior to visit.     PAST MEDICAL HISTORY: Past Medical History:  Diagnosis Date   Anemia 2004   HCT 34.7   Anxiety    follows with psyc for same   Back pain    Basal cell cancer    x3; Dr Tonia Brooms   Bladder leak    Chest pain 06/2010   Costochondritis   Diverticulosis    Gallbladder problem    GERD (gastroesophageal reflux disease)    Hyperlipidemia    IBS (irritable bowel syndrome)  Melanoma ([place]) 2006   x1   PCOS (polycystic ovarian syndrome)    Plantar fasciitis 08/13/2011   Left is significant and RT is mild by [place] criteria    Spasm of esophagus    Swelling    Thrombocytopenia ([place]) 2002   133,000 platelets    PAST SURGICAL HISTORY: Past Surgical History:  Procedure Laterality Date   CHOLECYSTECTOMY  2002   [facility]   COLONOSCOPY     MELANOMA EXCISION     LUE 2006  [facility]; Derm : Dr Crista Luria   SHOULDER SURGERY     Left   TONSILLECTOMY     TOTAL ABDOMINAL HYSTERECTOMY  1991   abnormal PAP hx    SOCIAL HISTORY: Social History   Tobacco Use   Smoking status: Never Smoker   Smokeless tobacco: Never Used  Substance Use Topics   Alcohol use: Yes    Comment: rarely, only holiday's   Drug use: No    FAMILY HISTORY: Family  History  Problem Relation Age of Onset   Hypertension Father    Colon polyps Father    GER disease Father    Skin cancer Father    Ovarian cancer Paternal Aunt    Diabetes Mother    Hypertension Mother    Obesity Mother    Heart attack Maternal Grandmother 76   Diabetes Maternal Grandmother    Heart attack Maternal Grandfather 25   Stroke Maternal Grandfather 51   Colon cancer Paternal Grandfather        unsure age    ROS: Review of Systems  Constitutional: Positive for weight loss.  Gastrointestinal: Negative for nausea and vomiting.  Musculoskeletal:       Negative for muscle weakness    PHYSICAL EXAM: Blood pressure 107/71, pulse 69, temperature 98.1 F (36.7 C), temperature source Oral, height 5\' 3"  (1.6 m), weight 188 lb (85.3 kg), SpO2 97 %. Body mass index is 33.3 kg/m. Physical Exam  Constitutional: She is oriented to person, place, and time. She appears well-developed and well-nourished.  Cardiovascular: Normal rate.  Pulmonary/Chest: Effort normal.  Musculoskeletal: Normal range of motion.  Neurological: She is oriented to person, place, and time.  Skin: Skin is warm and dry.  Psychiatric: She has a normal mood and affect. Her behavior is normal.  Vitals reviewed.   RECENT LABS AND TESTS: BMET    Component Value Date/Time   NA 140 02/23/2017 0825   K 4.6 02/23/2017 0825   CL 100 02/23/2017 0825   CO2 28 02/23/2017 0825   GLUCOSE 96 02/23/2017 0825   GLUCOSE 96 12/04/2015 0935   BUN 14 02/23/2017 0825   CREATININE 0.60 02/23/2017 0825   CALCIUM 9.6 02/23/2017 0825   GFRNONAA 102 02/23/2017 0825   GFRAA 118 02/23/2017 0825   Lab Results  Component Value Date   HGBA1C 5.5 02/23/2017   HGBA1C 5.4 10/08/2016   HGBA1C 5.4 06/03/2016   HGBA1C 5.6 12/04/2015   Lab Results  Component Value Date   INSULIN 4.8 02/23/2017   INSULIN 5.1 10/08/2016   INSULIN 7.4 06/03/2016   CBC    Component Value Date/Time   WBC 7.6 06/03/2016 1253     WBC 8.1 12/04/2015 0935   RBC 4.93 06/03/2016 1253   RBC 5.52 (H) 12/04/2015 0935   HGB 14.6 06/03/2016 1253   HGB 14.2 03/29/2010 1506   HCT 43.3 06/03/2016 1253   HCT 41.3 03/29/2010 1506   PLT 225.0 12/04/2015 0935   PLT 221 03/29/2010 1506  MCV 88 06/03/2016 1253   MCV 88.4 03/29/2010 1506   MCH 29.6 06/03/2016 1253   MCH 29.8 06/28/2010 0355   MCHC 33.7 06/03/2016 1253   MCHC 33.9 12/04/2015 0935   RDW 14.5 06/03/2016 1253   RDW 13.6 03/29/2010 1506   LYMPHSABS 1.5 06/03/2016 1253   LYMPHSABS 1.6 03/29/2010 1506   MONOABS 0.5 12/04/2015 0935   MONOABS 0.4 03/29/2010 1506   EOSABS 0.2 06/03/2016 1253   BASOSABS 0.0 06/03/2016 1253   BASOSABS 0.1 03/29/2010 1506   Iron/TIBC/Ferritin/ %Sat    Component Value Date/Time   IRON 65 01/22/2009 0000   IRONPCTSAT 21.8 01/22/2009 0000   Lipid Panel     Component Value Date/Time   CHOL 203 (H) 02/23/2017 0825   TRIG 136 02/23/2017 0825   HDL 44 02/23/2017 0825   CHOLHDL 5 12/04/2015 0935   VLDL 38.8 12/04/2015 0935   LDLCALC 132 (H) 02/23/2017 0825   LDLDIRECT 156.9 01/20/2008 0000   Hepatic Function Panel     Component Value Date/Time   PROT 6.7 02/23/2017 0825   ALBUMIN 4.3 02/23/2017 0825   AST 12 02/23/2017 0825   ALT 11 02/23/2017 0825   ALKPHOS 47 02/23/2017 0825   BILITOT 0.3 02/23/2017 0825   BILIDIR 0.1 12/01/2012 1035   IBILI NOT CALCULATED 06/28/2010 0355      Component Value Date/Time   TSH 1.550 06/03/2016 1253   TSH 2.27 12/04/2015 0935   TSH 1.97 12/04/2014 0958     Ref. Range 02/23/2017 08:25  Vitamin D, 25-Hydroxy Latest Ref Range: 30.0 - 100.0 ng/mL 32.3   ASSESSMENT AND PLAN: Class 1 obesity with serious comorbidity and body mass index (BMI) of 33.0 to 33.9 in adult, unspecified obesity type  Vitamin D deficiency - Plan: Vitamin D, Ergocalciferol, (DRISDOL) 50000 units CAPS capsule  PLAN:  Vitamin D Deficiency Ashlon was informed that low vitamin D levels contributes to fatigue  and are associated with obesity, breast, and colon cancer. She agrees to continue to take prescription Vit D @50 ,000 IU every week #4 with no refills and will follow up for routine testing of vitamin D, at least 2-3 times per year. She was informed of the risk of over-replacement of vitamin D and agrees to not increase her dose unless she discusses this with [place] first. Arizbeth agrees to follow up with our clinic in 4 weeks.  Obesity Lilla is currently in the action stage of change. As such, her goal is to continue with weight loss efforts She has agreed to follow the Category 2 plan Reagan has been instructed to work up to a goal of 150 minutes of combined cardio and strengthening exercise per week for weight loss and overall health benefits. We discussed the following Behavioral Modification Strategies today: better snacking choices, increasing lean protein intake and decreasing simple carbohydrates   Trinika has agreed to follow up with our clinic in 4 weeks. She was informed of the importance of frequent follow up visits to maximize her success with intensive lifestyle modifications for her multiple health conditions.   OBESITY BEHAVIORAL INTERVENTION VISIT  Today's visit was # 15 out of 22.  Starting weight: 212 lbs Starting date: 06/03/16 Today's weight : 188 lbs Today's date: 04/09/2017 Total lbs lost to date: 24 (Patients must lose 7 lbs in the first 6 months to continue with counseling)   ASK: We discussed the diagnosis of obesity with Ellin Goodie today and Virgia agreed to give [place] permission to discuss obesity behavioral modification  therapy today.  ASSESS: Tirzah has the diagnosis of obesity and her BMI today is 33.31 Aubri is in the action stage of change   ADVISE: Venecia was educated on the multiple health risks of obesity as well as the benefit of weight loss to improve her health. She was advised of the need for long term treatment and the importance of lifestyle  modifications.  AGREE: Multiple dietary modification options and treatment options were discussed and  Jazmine agreed to the above obesity treatment plan.  I, Doreene Nest, am acting as transcriptionist for Dennard Nip, MD  I have reviewed the above documentation for accuracy and completeness, and I agree with the above. -Dennard Nip, MD

## 2017-04-13 ENCOUNTER — Ambulatory Visit (INDEPENDENT_AMBULATORY_CARE_PROVIDER_SITE_OTHER): Payer: PRIVATE HEALTH INSURANCE | Admitting: Family Medicine

## 2017-05-05 ENCOUNTER — Ambulatory Visit (INDEPENDENT_AMBULATORY_CARE_PROVIDER_SITE_OTHER): Payer: PRIVATE HEALTH INSURANCE | Admitting: Family Medicine

## 2017-05-05 VITALS — BP 119/77 | HR 71 | Temp 97.8°F | Ht 63.0 in | Wt 189.0 lb

## 2017-05-05 DIAGNOSIS — Z6833 Body mass index (BMI) 33.0-33.9, adult: Secondary | ICD-10-CM | POA: Diagnosis not present

## 2017-05-05 DIAGNOSIS — E669 Obesity, unspecified: Secondary | ICD-10-CM

## 2017-05-05 DIAGNOSIS — E559 Vitamin D deficiency, unspecified: Secondary | ICD-10-CM

## 2017-05-05 MED ORDER — VITAMIN D (ERGOCALCIFEROL) 1.25 MG (50000 UNIT) PO CAPS: 50000.0000 [IU] | ORAL_CAPSULE | ORAL | 0 refills | Status: DC

## 2017-05-05 NOTE — Progress Notes (Signed)
Office: [phone]  /  Fax: [phone]   HPI:   Chief Complaint: OBESITY Kristin Rasmussen is here to discuss her progress with her obesity treatment plan. She is on the Category 2 plan and is following her eating plan approximately 90-95 % of the time. She states she is doing yard work. Samhita continues to work on [employer] loss, she states hunger is controlled and is not feeling deprived. She is doing mulching and pruning flowerbed for exercise.  Her weight is 189 lb (85.7 kg) today and has gained 1 pound since her last visit. She has lost 23 lbs since starting treatment with [place].  Vitamin D Deficiency Jeidi has a diagnosis of vitamin D deficiency. She is on Vit D prescription, last level not at goal. She denies nausea, vomiting or muscle weakness.  ALLERGIES: No Known Allergies  MEDICATIONS: Current Outpatient Medications on File Prior to Visit  Medication Sig Dispense Refill  . acetaminophen (TYLENOL) 500 MG tablet Take 500 mg by mouth every 6 (six) hours as needed.    . ALPRAZolam (XANAX) 0.25 MG tablet Take 12.5 mg by mouth 2 (two) times daily.      . Ascorbic Acid (VITAMIN C) 500 MG CAPS Take by mouth.    . calcium citrate-vitamin D (CITRACAL+D) 315-200 MG-UNIT tablet Take 1 tablet by mouth 2 (two) times daily.    . cetirizine (ZYRTEC) 10 MG tablet Take 10 mg by mouth daily.    . diphenhydrAMINE (BENADRYL) 25 mg capsule Take 25 mg by mouth every 6 (six) hours as needed.    . docusate sodium (COLACE) 100 MG capsule Take 100 mg by mouth daily as needed for mild constipation.    [name] escitalopram (LEXAPRO) 20 MG tablet Take 20 mg by mouth daily.    [name] esomeprazole (NEXIUM) 40 MG capsule TAKE 1 CAPSULE BY MOUTH ONCE DAILY 30 capsule 5  . estradiol (VIVELLE-DOT) 0.075 MG/24HR Apply 1 patch externally 2 times a week  5  . fish oil-omega-3 fatty acids 1000 MG capsule Take 1,200 mg by mouth daily.     . magnesium hydroxide (MILK OF MAGNESIA) 400 MG/5ML suspension Take by mouth daily as needed for  mild constipation.    . Multiple Vitamin (MULTIVITAMIN) tablet Take 1 tablet by mouth daily.      [name] oxybutynin (DITROPAN-XL) 5 MG 24 hr tablet Take 5 mg by mouth at bedtime.    . pravastatin (PRAVACHOL) 20 MG tablet Take 1 tablet (20 mg total) by mouth daily. 90 tablet 1  . Vitamin D, Ergocalciferol, (DRISDOL) 50000 units CAPS capsule Take 1 capsule (50,000 Units total) by mouth every 7 (seven) days. 4 capsule 0   No current facility-administered medications on file prior to visit.     PAST MEDICAL HISTORY: Past Medical History:  Diagnosis Date  . Anemia 2004   HCT 34.7  . Anxiety    follows with psyc for same  . Back pain   . Basal cell cancer    x3; Dr Tonia Brooms  . Bladder leak   . Chest pain 06/2010   Costochondritis  . Diverticulosis   . Gallbladder problem   . GERD (gastroesophageal reflux disease)   . Hyperlipidemia   . IBS (irritable bowel syndrome)   . Melanoma ([place]) 2006   x1  . PCOS (polycystic ovarian syndrome)   . Plantar fasciitis 08/13/2011   Left is significant and RT is mild by [place] criteria   . Spasm of esophagus   . Swelling   . Thrombocytopenia ([place])  2002   133,000 platelets    PAST SURGICAL HISTORY: Past Surgical History:  Procedure Laterality Date  . CHOLECYSTECTOMY  2002   [facility]  . COLONOSCOPY    . MELANOMA EXCISION     LUE 2006  [facility]; Derm : Dr Crista Luria  . SHOULDER SURGERY     Left  . TONSILLECTOMY    . TOTAL ABDOMINAL HYSTERECTOMY  1991   abnormal PAP hx    SOCIAL HISTORY: Social History   Tobacco Use  . Smoking status: Never Smoker  . Smokeless tobacco: Never Used  Substance Use Topics  . Alcohol use: Yes    Comment: rarely, only holiday's  . Drug use: No    FAMILY HISTORY: Family History  Problem Relation Age of Onset  . Hypertension Father   . Colon polyps Father   . GER disease Father   . Skin cancer Father   . Ovarian cancer Paternal Aunt   . Diabetes Mother   . Hypertension Mother   . Obesity Mother   . Heart attack  Maternal Grandmother 76  . Diabetes Maternal Grandmother   . Heart attack Maternal Grandfather 69  . Stroke Maternal Grandfather 27  . Colon cancer Paternal Grandfather        unsure age    ROS: Review of Systems  Constitutional: Negative for weight loss.  Gastrointestinal: Negative for nausea and vomiting.  Musculoskeletal:       Negative muscle weakness    PHYSICAL EXAM: Blood pressure 119/77, pulse 71, temperature 97.8 F (36.6 C), temperature source Oral, height 5\' 3"  (1.6 m), weight 189 lb (85.7 kg), SpO2 98 %. Body mass index is 33.48 kg/m. Physical Exam  Constitutional: She is oriented to person, place, and time. She appears well-developed and well-nourished.  Cardiovascular: Normal rate.  Pulmonary/Chest: Effort normal.  Musculoskeletal: Normal range of motion.  Neurological: She is oriented to person, place, and time.  Skin: Skin is warm and dry.  Psychiatric: She has a normal mood and affect. Her behavior is normal.  Vitals reviewed.   RECENT LABS AND TESTS: BMET    Component Value Date/Time   NA 140 02/23/2017 0825   K 4.6 02/23/2017 0825   CL 100 02/23/2017 0825   CO2 28 02/23/2017 0825   GLUCOSE 96 02/23/2017 0825   GLUCOSE 96 12/04/2015 0935   BUN 14 02/23/2017 0825   CREATININE 0.60 02/23/2017 0825   CALCIUM 9.6 02/23/2017 0825   GFRNONAA 102 02/23/2017 0825   GFRAA 118 02/23/2017 0825   Lab Results  Component Value Date   HGBA1C 5.5 02/23/2017   HGBA1C 5.4 10/08/2016   HGBA1C 5.4 06/03/2016   HGBA1C 5.6 12/04/2015   Lab Results  Component Value Date   INSULIN 4.8 02/23/2017   INSULIN 5.1 10/08/2016   INSULIN 7.4 06/03/2016   CBC    Component Value Date/Time   WBC 7.6 06/03/2016 1253   WBC 8.1 12/04/2015 0935   RBC 4.93 06/03/2016 1253   RBC 5.52 (H) 12/04/2015 0935   HGB 14.6 06/03/2016 1253   HGB 14.2 03/29/2010 1506   HCT 43.3 06/03/2016 1253   HCT 41.3 03/29/2010 1506   PLT 225.0 12/04/2015 0935   PLT 221 03/29/2010 1506    MCV 88 06/03/2016 1253   MCV 88.4 03/29/2010 1506   MCH 29.6 06/03/2016 1253   MCH 29.8 06/28/2010 0355   MCHC 33.7 06/03/2016 1253   MCHC 33.9 12/04/2015 0935   RDW 14.5 06/03/2016 1253   RDW 13.6 03/29/2010 1506  LYMPHSABS 1.5 06/03/2016 1253   LYMPHSABS 1.6 03/29/2010 1506   MONOABS 0.5 12/04/2015 0935   MONOABS 0.4 03/29/2010 1506   EOSABS 0.2 06/03/2016 1253   BASOSABS 0.0 06/03/2016 1253   BASOSABS 0.1 03/29/2010 1506   Iron/TIBC/Ferritin/ %Sat    Component Value Date/Time   IRON 65 01/22/2009 0000   IRONPCTSAT 21.8 01/22/2009 0000   Lipid Panel     Component Value Date/Time   CHOL 203 (H) 02/23/2017 0825   TRIG 136 02/23/2017 0825   HDL 44 02/23/2017 0825   CHOLHDL 5 12/04/2015 0935   VLDL 38.8 12/04/2015 0935   LDLCALC 132 (H) 02/23/2017 0825   LDLDIRECT 156.9 01/20/2008 0000   Hepatic Function Panel     Component Value Date/Time   PROT 6.7 02/23/2017 0825   ALBUMIN 4.3 02/23/2017 0825   AST 12 02/23/2017 0825   ALT 11 02/23/2017 0825   ALKPHOS 47 02/23/2017 0825   BILITOT 0.3 02/23/2017 0825   BILIDIR 0.1 12/01/2012 1035   IBILI NOT CALCULATED 06/28/2010 0355      Component Value Date/Time   TSH 1.550 06/03/2016 1253   TSH 2.27 12/04/2015 0935   TSH 1.97 12/04/2014 0958  Results for ARELENE, MORONI (MRN [id number]) as of 05/05/2017 15:48  Ref. Range 02/23/2017 08:25  Vitamin D, 25-Hydroxy Latest Ref Range: 30.0 - 100.0 ng/mL 32.3    ASSESSMENT AND PLAN: Vitamin D deficiency - Plan: Vitamin D, Ergocalciferol, (DRISDOL) 50000 units CAPS capsule  Class 1 obesity with serious comorbidity and body mass index (BMI) of 33.0 to 33.9 in adult, unspecified obesity type  PLAN:  Vitamin D Deficiency Journii was informed that low vitamin D levels contributes to fatigue and are associated with obesity, breast, and colon cancer. Nelma agrees to continue taking prescription Vit D @50 ,000 IU every week #4 and we will refill for 1 month. She will follow up for  routine testing of vitamin D, at least 2-3 times per year. She was informed of the risk of over-replacement of vitamin D and agrees to not increase her dose unless she discusses this with [place] first. We will recheck labs in 1 month and Kyley agrees to follow up with our clinic in 3 weeks.  Obesity Negin is currently in the action stage of change. As such, her goal is to continue with weight loss efforts She has agreed to keep a food journal with 350-500 calories and 35+ grams of protein at supper daily and follow the Category 2 plan Janaysia has been instructed to work up to a goal of 150 minutes of combined cardio and strengthening exercise per week for weight loss and overall health benefits. We discussed the following Behavioral Modification Strategies today: increasing lean protein intake, decreasing simple carbohydrates, and travel eating strategies    Kody has agreed to follow up with our clinic in 3 weeks. She was informed of the importance of frequent follow up visits to maximize her success with intensive lifestyle modifications for her multiple health conditions.   OBESITY BEHAVIORAL INTERVENTION VISIT  Today's visit was # 16 out of 22.  Starting weight: 212 lbs Starting date: 06/03/16 Today's weight : 189 lbs  Today's date: 05/05/2017 Total lbs lost to date: 23 (Patients must lose 7 lbs in the first 6 months to continue with counseling)   ASK: We discussed the diagnosis of obesity with Ellin Goodie today and Mayda agreed to give [place] permission to discuss obesity behavioral modification therapy today.  ASSESS: Gracilyn has the diagnosis of  obesity and her BMI today is 33.49 Ashima is in the action stage of change   ADVISE: Jenine was educated on the multiple health risks of obesity as well as the benefit of weight loss to improve her health. She was advised of the need for long term treatment and the importance of lifestyle modifications.  AGREE: Multiple dietary  modification options and treatment options were discussed and  Vega agreed to the above obesity treatment plan.  I, Trixie Dredge, am acting as transcriptionist for Dennard Nip, MD  I have reviewed the above documentation for accuracy and completeness, and I agree with the above. -Dennard Nip, MD

## 2017-05-06 ENCOUNTER — Telehealth (INDEPENDENT_AMBULATORY_CARE_PROVIDER_SITE_OTHER): Payer: Self-pay | Admitting: Family Medicine

## 2017-05-06 NOTE — Telephone Encounter (Signed)
As long as it is over 3 months her insurance will cover it. April CMA

## 2017-05-06 NOTE — Telephone Encounter (Signed)
Patient was seen 4/2.  Kristin Rasmussen is scheduled on 4/30 for a follow up with Dr Leafy Ro.  Dr. Leafy Ro told her they would do labs at this visit because it will be about 4 months since they have been done. Kristin Rasmussen states that labs were done 1/21 which is 3 months.  Because of insurance she is asking if she should have the labs done at this next visit or wait until May.  Please advise. Thank you Maudie Mercury

## 2017-05-07 NOTE — Telephone Encounter (Signed)
Called and informed patient. 

## 2017-05-31 ENCOUNTER — Other Ambulatory Visit (INDEPENDENT_AMBULATORY_CARE_PROVIDER_SITE_OTHER): Payer: Self-pay | Admitting: Family Medicine

## 2017-05-31 DIAGNOSIS — E559 Vitamin D deficiency, unspecified: Secondary | ICD-10-CM

## 2017-06-02 ENCOUNTER — Ambulatory Visit (INDEPENDENT_AMBULATORY_CARE_PROVIDER_SITE_OTHER): Payer: PRIVATE HEALTH INSURANCE | Admitting: Family Medicine

## 2017-06-02 VITALS — BP 104/68 | HR 73 | Temp 97.8°F | Ht 63.0 in | Wt 188.0 lb

## 2017-06-02 DIAGNOSIS — E782 Mixed hyperlipidemia: Secondary | ICD-10-CM | POA: Diagnosis not present

## 2017-06-02 DIAGNOSIS — Z6833 Body mass index (BMI) 33.0-33.9, adult: Secondary | ICD-10-CM

## 2017-06-02 DIAGNOSIS — E559 Vitamin D deficiency, unspecified: Secondary | ICD-10-CM | POA: Diagnosis not present

## 2017-06-02 DIAGNOSIS — E669 Obesity, unspecified: Secondary | ICD-10-CM

## 2017-06-02 MED ORDER — VITAMIN D (ERGOCALCIFEROL) 1.25 MG (50000 UNIT) PO CAPS: 50000.0000 [IU] | ORAL_CAPSULE | ORAL | 0 refills | Status: DC

## 2017-06-02 NOTE — Progress Notes (Signed)
Office: [phone]  /  Fax: [phone]   HPI:   Chief Complaint: OBESITY Kristin Rasmussen is here to discuss her progress with her obesity treatment plan. She is on the Category 2 plan and is following her eating plan approximately 90 to 95 % of the time. She states she is doing stretches for 30 minutes 2 to 3 times per week. Kristin Rasmussen continues to do well with weight loss on her category 2 plan. Hunger is controlled and she is doing well with meal planning, but she is getting bored. She has questions about how she will maintain her weight when she gets to goal. Her weight is 188 lb (85.3 kg) today and has had a weight loss of 1 pound over a period of 4 weeks since her last visit. She has lost 24 lbs since starting treatment with [place].  Vitamin D deficiency Kristin Rasmussen has a diagnosis of vitamin D deficiency. She is currently taking prescription vit D and denies nausea, vomiting or muscle weakness.  Hyperlipidemia Kristin Rasmussen has hyperlipidemia and is on pravastatin and diet prescription. She has been trying to improve her cholesterol levels with intensive lifestyle modification including a low saturated fat diet, exercise and weight loss. She denies any chest pain or myalgias.  ALLERGIES: No Known Allergies  MEDICATIONS: Current Outpatient Medications on File Prior to Visit  Medication Sig Dispense Refill  . acetaminophen (TYLENOL) 500 MG tablet Take 500 mg by mouth every 6 (six) hours as needed.    . ALPRAZolam (XANAX) 0.25 MG tablet Take 12.5 mg by mouth 2 (two) times daily.      . Ascorbic Acid (VITAMIN C) 500 MG CAPS Take by mouth.    . calcium citrate-vitamin D (CITRACAL+D) 315-200 MG-UNIT tablet Take 1 tablet by mouth 2 (two) times daily.    . cetirizine (ZYRTEC) 10 MG tablet Take 10 mg by mouth daily.    . diphenhydrAMINE (BENADRYL) 25 mg capsule Take 25 mg by mouth every 6 (six) hours as needed.    . docusate sodium (COLACE) 100 MG capsule Take 100 mg by mouth daily as needed for mild  constipation.    [name] escitalopram (LEXAPRO) 20 MG tablet Take 20 mg by mouth daily.    [name] esomeprazole (NEXIUM) 40 MG capsule TAKE 1 CAPSULE BY MOUTH ONCE DAILY 30 capsule 5  . estradiol (VIVELLE-DOT) 0.075 MG/24HR Apply 1 patch externally 2 times a week  5  . fish oil-omega-3 fatty acids 1000 MG capsule Take 1,200 mg by mouth daily.     . magnesium hydroxide (MILK OF MAGNESIA) 400 MG/5ML suspension Take by mouth daily as needed for mild constipation.    . Multiple Vitamin (MULTIVITAMIN) tablet Take 1 tablet by mouth daily.      [name] oxybutynin (DITROPAN-XL) 5 MG 24 hr tablet Take 5 mg by mouth at bedtime.    . pravastatin (PRAVACHOL) 20 MG tablet Take 1 tablet (20 mg total) by mouth daily. 90 tablet 1   No current facility-administered medications on file prior to visit.     PAST MEDICAL HISTORY: Past Medical History:  Diagnosis Date  . Anemia 2004   HCT 34.7  . Anxiety    follows with psyc for same  . Back pain   . Basal cell cancer    x3; Dr Tonia Brooms  . Bladder leak   . Chest pain 06/2010   Costochondritis  . Diverticulosis   . Gallbladder problem   . GERD (gastroesophageal reflux disease)   . Hyperlipidemia   . IBS (irritable  bowel syndrome)   . Melanoma ([place]) 2006   x1  . PCOS (polycystic ovarian syndrome)   . Plantar fasciitis 08/13/2011   Left is significant and RT is mild by [place] criteria   . Spasm of esophagus   . Swelling   . Thrombocytopenia ([place]) 2002   133,000 platelets    PAST SURGICAL HISTORY: Past Surgical History:  Procedure Laterality Date  . CHOLECYSTECTOMY  2002   [facility]  . COLONOSCOPY    . MELANOMA EXCISION     LUE 2006  [facility]; Derm : Dr Crista Luria  . SHOULDER SURGERY     Left  . TONSILLECTOMY    . TOTAL ABDOMINAL HYSTERECTOMY  1991   abnormal PAP hx    SOCIAL HISTORY: Social History   Tobacco Use  . Smoking status: Never Smoker  . Smokeless tobacco: Never Used  Substance Use Topics  . Alcohol use: Yes    Comment: rarely, only holiday's  . Drug  use: No    FAMILY HISTORY: Family History  Problem Relation Age of Onset  . Hypertension Father   . Colon polyps Father   . GER disease Father   . Skin cancer Father   . Ovarian cancer Paternal Aunt   . Diabetes Mother   . Hypertension Mother   . Obesity Mother   . Heart attack Maternal Grandmother 76  . Diabetes Maternal Grandmother   . Heart attack Maternal Grandfather 69  . Stroke Maternal Grandfather 37  . Colon cancer Paternal Grandfather        unsure age    ROS: Review of Systems  Constitutional: Positive for weight loss.  Cardiovascular: Negative for chest pain.  Gastrointestinal: Negative for nausea and vomiting.  Musculoskeletal: Negative for myalgias.       Negative for muscle weakness    PHYSICAL EXAM: Blood pressure 104/68, pulse 73, temperature 97.8 F (36.6 C), temperature source Oral, height 5\' 3"  (1.6 m), weight 188 lb (85.3 kg), SpO2 95 %. Body mass index is 33.3 kg/m. Physical Exam  Constitutional: She is oriented to person, place, and time. She appears well-developed and well-nourished.  Cardiovascular: Normal rate.  Pulmonary/Chest: Effort normal.  Musculoskeletal: Normal range of motion.  Neurological: She is oriented to person, place, and time.  Skin: Skin is warm and dry.  Psychiatric: She has a normal mood and affect. Her behavior is normal.  Vitals reviewed.   RECENT LABS AND TESTS: BMET    Component Value Date/Time   NA 140 02/23/2017 0825   K 4.6 02/23/2017 0825   CL 100 02/23/2017 0825   CO2 28 02/23/2017 0825   GLUCOSE 96 02/23/2017 0825   GLUCOSE 96 12/04/2015 0935   BUN 14 02/23/2017 0825   CREATININE 0.60 02/23/2017 0825   CALCIUM 9.6 02/23/2017 0825   GFRNONAA 102 02/23/2017 0825   GFRAA 118 02/23/2017 0825   Lab Results  Component Value Date   HGBA1C 5.5 02/23/2017   HGBA1C 5.4 10/08/2016   HGBA1C 5.4 06/03/2016   HGBA1C 5.6 12/04/2015   Lab Results  Component Value Date   INSULIN 4.8 02/23/2017   INSULIN 5.1  10/08/2016   INSULIN 7.4 06/03/2016   CBC    Component Value Date/Time   WBC 7.6 06/03/2016 1253   WBC 8.1 12/04/2015 0935   RBC 4.93 06/03/2016 1253   RBC 5.52 (H) 12/04/2015 0935   HGB 14.6 06/03/2016 1253   HGB 14.2 03/29/2010 1506   HCT 43.3 06/03/2016 1253   HCT 41.3 03/29/2010 1506  PLT 225.0 12/04/2015 0935   PLT 221 03/29/2010 1506   MCV 88 06/03/2016 1253   MCV 88.4 03/29/2010 1506   MCH 29.6 06/03/2016 1253   MCH 29.8 06/28/2010 0355   MCHC 33.7 06/03/2016 1253   MCHC 33.9 12/04/2015 0935   RDW 14.5 06/03/2016 1253   RDW 13.6 03/29/2010 1506   LYMPHSABS 1.5 06/03/2016 1253   LYMPHSABS 1.6 03/29/2010 1506   MONOABS 0.5 12/04/2015 0935   MONOABS 0.4 03/29/2010 1506   EOSABS 0.2 06/03/2016 1253   BASOSABS 0.0 06/03/2016 1253   BASOSABS 0.1 03/29/2010 1506   Iron/TIBC/Ferritin/ %Sat    Component Value Date/Time   IRON 65 01/22/2009 0000   IRONPCTSAT 21.8 01/22/2009 0000   Lipid Panel     Component Value Date/Time   CHOL 203 (H) 02/23/2017 0825   TRIG 136 02/23/2017 0825   HDL 44 02/23/2017 0825   CHOLHDL 5 12/04/2015 0935   VLDL 38.8 12/04/2015 0935   LDLCALC 132 (H) 02/23/2017 0825   LDLDIRECT 156.9 01/20/2008 0000   Hepatic Function Panel     Component Value Date/Time   PROT 6.7 02/23/2017 0825   ALBUMIN 4.3 02/23/2017 0825   AST 12 02/23/2017 0825   ALT 11 02/23/2017 0825   ALKPHOS 47 02/23/2017 0825   BILITOT 0.3 02/23/2017 0825   BILIDIR 0.1 12/01/2012 1035   IBILI NOT CALCULATED 06/28/2010 0355      Component Value Date/Time   TSH 1.550 06/03/2016 1253   TSH 2.27 12/04/2015 0935   TSH 1.97 12/04/2014 0958   Results for JERSEY, ESPINOZA (MRN [id number]) as of 06/02/2017 09:48  Ref. Range 02/23/2017 08:25  Vitamin D, 25-Hydroxy Latest Ref Range: 30.0 - 100.0 ng/mL 32.3   ASSESSMENT AND PLAN: Vitamin D deficiency - Plan: Vitamin D, Ergocalciferol, (DRISDOL) 50000 units CAPS capsule  Mixed hyperlipidemia  Class 1 obesity with serious  comorbidity and body mass index (BMI) of 33.0 to 33.9 in adult, unspecified obesity type  PLAN:  Vitamin D Deficiency Kristin Rasmussen was informed that low vitamin D levels contributes to fatigue and are associated with obesity, breast, and colon cancer. She agrees to continue to take prescription Vit D @50 ,000 IU every week #4 with no refills. We will check labs and she will follow up for routine testing of vitamin D, at least 2-3 times per year. She was informed of the risk of over-replacement of vitamin D and agrees to not increase her dose unless she discusses this with [place] first. Kristin Rasmussen agrees to follow up with our clinic in 4 weeks.  Hyperlipidemia Kristin Rasmussen was informed of the American Heart Association Guidelines emphasizing intensive lifestyle modifications as the first line treatment for hyperlipidemia. We discussed many lifestyle modifications today in depth, and Kristin Rasmussen will continue to work on decreasing saturated fats such as fatty red meat, butter and many fried foods. She will also increase vegetables and lean protein in her diet and continue to work on exercise and weight loss efforts. We will check labs and Kristin Rasmussen will follow up as directed.  Obesity Kristin Rasmussen is currently in the action stage of change. As such, her goal is to continue with weight loss efforts She has agreed to keep a food journal with 1200 to 1400 calories and 75+ grams of protein daily Kristin Rasmussen has been instructed to work up to a goal of 150 minutes of combined cardio and strengthening exercise per week for weight loss and overall health benefits. We discussed the following Behavioral Modification Strategies today: keeping healthy foods in  the home, increasing lean protein intake, decreasing simple carbohydrates  and work on meal planning and easy cooking plans  Kristin Rasmussen has agreed to follow up with our clinic in 4 weeks. She was informed of the importance of frequent follow up visits to maximize her success with intensive lifestyle  modifications for her multiple health conditions.   OBESITY BEHAVIORAL INTERVENTION VISIT  Today's visit was # 17 out of 22.  Starting weight: 212 lbs Starting date: 06/03/16 Today's weight : 188 lbs Today's date: 06/02/2017 Total lbs lost to date: 24 (Patients must lose 7 lbs in the first 6 months to continue with counseling)   ASK: We discussed the diagnosis of obesity with Kristin Rasmussen today and Kristin Rasmussen agreed to give [place] permission to discuss obesity behavioral modification therapy today.  ASSESS: Kristin Rasmussen has the diagnosis of obesity and her BMI today is 33.31 Kristin Rasmussen is in the action stage of change   ADVISE: Kristin Rasmussen was educated on the multiple health risks of obesity as well as the benefit of weight loss to improve her health. She was advised of the need for long term treatment and the importance of lifestyle modifications.  AGREE: Multiple dietary modification options and treatment options were discussed and  Kristin Rasmussen agreed to the above obesity treatment plan.  I, Doreene Nest, am acting as transcriptionist for Dennard Nip, MD  I have reviewed the above documentation for accuracy and completeness, and I agree with the above. -Dennard Nip, MD

## 2017-06-03 LAB — COMPREHENSIVE METABOLIC PANEL
A/G RATIO: 2 (ref 1.2–2.2)
ALK PHOS: 41 IU/L (ref 39–117)
ALT: 19 IU/L (ref 0–32)
AST: 17 IU/L (ref 0–40)
Albumin: 4.4 g/dL (ref 3.5–5.5)
BUN/Creatinine Ratio: 26 — ABNORMAL HIGH (ref 9–23)
BUN: 13 mg/dL (ref 6–24)
Bilirubin Total: 0.2 mg/dL (ref 0.0–1.2)
CALCIUM: 9.4 mg/dL (ref 8.7–10.2)
CO2: 27 mmol/L (ref 20–29)
CREATININE: 0.5 mg/dL — AB (ref 0.57–1.00)
Chloride: 97 mmol/L (ref 96–106)
GFR calc Af Amer: 125 mL/min/{1.73_m2} (ref >59)
GFR calc non Af Amer: 109 mL/min/{1.73_m2} (ref >59)
Globulin, Total: 2.2 g/dL (ref 1.5–4.5)
Glucose: 83 mg/dL (ref 65–99)
POTASSIUM: 4.1 mmol/L (ref 3.5–5.2)
Sodium: 140 mmol/L (ref 134–144)
Total Protein: 6.6 g/dL (ref 6.0–8.5)

## 2017-06-03 LAB — TSH: TSH: 1.79 u[IU]/mL (ref 0.450–4.500)

## 2017-06-03 LAB — LIPID PANEL WITH LDL/HDL RATIO
CHOLESTEROL TOTAL: 205 mg/dL — AB (ref 100–199)
HDL: 40 mg/dL (ref >39)
LDL Calculated: 131 mg/dL — ABNORMAL HIGH (ref 0–99)
LDl/HDL Ratio: 3.3 ratio — ABNORMAL HIGH (ref 0.0–3.2)
TRIGLYCERIDES: 170 mg/dL — AB (ref 0–149)
VLDL Cholesterol Cal: 34 mg/dL (ref 5–40)

## 2017-06-03 LAB — VITAMIN D 25 HYDROXY (VIT D DEFICIENCY, FRACTURES): Vit D, 25-Hydroxy: 62.5 ng/mL (ref 30.0–100.0)

## 2017-06-27 ENCOUNTER — Other Ambulatory Visit (INDEPENDENT_AMBULATORY_CARE_PROVIDER_SITE_OTHER): Payer: Self-pay | Admitting: Family Medicine

## 2017-06-27 DIAGNOSIS — E559 Vitamin D deficiency, unspecified: Secondary | ICD-10-CM

## 2017-07-01 ENCOUNTER — Ambulatory Visit (INDEPENDENT_AMBULATORY_CARE_PROVIDER_SITE_OTHER): Payer: PRIVATE HEALTH INSURANCE | Admitting: Family Medicine

## 2017-07-01 VITALS — BP 95/55 | HR 65 | Temp 97.5°F | Ht 63.0 in | Wt 192.0 lb

## 2017-07-01 DIAGNOSIS — E559 Vitamin D deficiency, unspecified: Secondary | ICD-10-CM

## 2017-07-01 DIAGNOSIS — E669 Obesity, unspecified: Secondary | ICD-10-CM

## 2017-07-01 DIAGNOSIS — Z6834 Body mass index (BMI) 34.0-34.9, adult: Secondary | ICD-10-CM

## 2017-07-01 MED ORDER — VITAMIN D (ERGOCALCIFEROL) 1.25 MG (50000 UNIT) PO CAPS: 50000.0000 [IU] | ORAL_CAPSULE | ORAL | 0 refills | Status: DC

## 2017-07-01 NOTE — Progress Notes (Signed)
Office: [phone]  /  Fax: [phone]   HPI:   Chief Complaint: OBESITY Kristin Rasmussen is here to discuss her progress with her obesity treatment plan. She is on the keep a food journal with 1200-1400 calories and 75+ grams of protein daily and is following her eating plan approximately 90 % of the time. She states she is doing yard work for 120 minutes 2 times per week. Jamyrah has been on vacation and increased celebration eating. She journals some for dinner on and off. She is mostly maintaining her weight.  Her weight is 192 lb (87.1 kg) today and has gained 4 pounds since her last visit. She has lost 20 lbs since starting treatment with [place].  Vitamin D Deficiency Kristin Rasmussen has a diagnosis of vitamin D deficiency. She is currently taking prescription Vit D. Kristin Rasmussen is now at goal, doesn't get sun due to history of 3 types of skin cancer including melanoma. She denies nausea, vomiting or muscle weakness.  ALLERGIES: No Known Allergies  MEDICATIONS: Current Outpatient Medications on File Prior to Visit  Medication Sig Dispense Refill  . acetaminophen (TYLENOL) 500 MG tablet Take 500 mg by mouth every 6 (six) hours as needed.    . ALPRAZolam (XANAX) 0.25 MG tablet Take 12.5 mg by mouth 2 (two) times daily.      . Ascorbic Acid (VITAMIN C) 500 MG CAPS Take by mouth.    . calcium citrate-vitamin D (CITRACAL+D) 315-200 MG-UNIT tablet Take 1 tablet by mouth 2 (two) times daily.    . cetirizine (ZYRTEC) 10 MG tablet Take 10 mg by mouth daily.    . diphenhydrAMINE (BENADRYL) 25 mg capsule Take 25 mg by mouth every 6 (six) hours as needed.    . docusate sodium (COLACE) 100 MG capsule Take 100 mg by mouth daily as needed for mild constipation.    [name] escitalopram (LEXAPRO) 20 MG tablet Take 20 mg by mouth daily.    [name] esomeprazole (NEXIUM) 40 MG capsule TAKE 1 CAPSULE BY MOUTH ONCE DAILY 30 capsule 5  . estradiol (VIVELLE-DOT) 0.075 MG/24HR Apply 1 patch externally 2 times a week  5  . fish oil-omega-3  fatty acids 1000 MG capsule Take 1,200 mg by mouth daily.     . magnesium hydroxide (MILK OF MAGNESIA) 400 MG/5ML suspension Take by mouth daily as needed for mild constipation.    . Multiple Vitamin (MULTIVITAMIN) tablet Take 1 tablet by mouth daily.      [name] oxybutynin (DITROPAN-XL) 5 MG 24 hr tablet Take 5 mg by mouth at bedtime.    . pravastatin (PRAVACHOL) 20 MG tablet Take 1 tablet (20 mg total) by mouth daily. 90 tablet 1  . Vitamin D, Ergocalciferol, (DRISDOL) 50000 units CAPS capsule Take 1 capsule (50,000 Units total) by mouth every 7 (seven) days. 4 capsule 0   No current facility-administered medications on file prior to visit.     PAST MEDICAL HISTORY: Past Medical History:  Diagnosis Date  . Anemia 2004   HCT 34.7  . Anxiety    follows with psyc for same  . Back pain   . Basal cell cancer    x3; Dr Tonia Brooms  . Bladder leak   . Chest pain 06/2010   Costochondritis  . Diverticulosis   . Gallbladder problem   . GERD (gastroesophageal reflux disease)   . Hyperlipidemia   . IBS (irritable bowel syndrome)   . Melanoma ([place]) 2006   x1  . PCOS (polycystic ovarian syndrome)   . Plantar  fasciitis 08/13/2011   Left is significant and RT is mild by [place] criteria   . Spasm of esophagus   . Swelling   . Thrombocytopenia ([place]) 2002   133,000 platelets    PAST SURGICAL HISTORY: Past Surgical History:  Procedure Laterality Date  . CHOLECYSTECTOMY  2002   [facility]  . COLONOSCOPY    . MELANOMA EXCISION     LUE 2006  [facility]; Derm : Dr Crista Luria  . SHOULDER SURGERY     Left  . TONSILLECTOMY    . TOTAL ABDOMINAL HYSTERECTOMY  1991   abnormal PAP hx    SOCIAL HISTORY: Social History   Tobacco Use  . Smoking status: Never Smoker  . Smokeless tobacco: Never Used  Substance Use Topics  . Alcohol use: Yes    Comment: rarely, only holiday's  . Drug use: No    FAMILY HISTORY: Family History  Problem Relation Age of Onset  . Hypertension Father   . Colon polyps Father   . GER  disease Father   . Skin cancer Father   . Ovarian cancer Paternal Aunt   . Diabetes Mother   . Hypertension Mother   . Obesity Mother   . Heart attack Maternal Grandmother 76  . Diabetes Maternal Grandmother   . Heart attack Maternal Grandfather 69  . Stroke Maternal Grandfather 62  . Colon cancer Paternal Grandfather        unsure age    ROS: Review of Systems  Constitutional: Negative for weight loss.  Gastrointestinal: Negative for nausea and vomiting.  Musculoskeletal:       Negative muscle weakness    PHYSICAL EXAM: Blood pressure (!) 95/55, pulse 65, temperature (!) 97.5 F (36.4 C), temperature source Oral, height 5\' 3"  (1.6 m), weight 192 lb (87.1 kg), SpO2 97 %. Body mass index is 34.01 kg/m. Physical Exam  Constitutional: She is oriented to person, place, and time. She appears well-developed and well-nourished.  Cardiovascular: Normal rate.  Pulmonary/Chest: Effort normal.  Musculoskeletal: Normal range of motion.  Neurological: She is oriented to person, place, and time.  Skin: Skin is warm and dry.  Psychiatric: She has a normal mood and affect. Her behavior is normal.  Vitals reviewed.   RECENT LABS AND TESTS: BMET    Component Value Date/Time   NA 140 06/02/2017 1124   K 4.1 06/02/2017 1124   CL 97 06/02/2017 1124   CO2 27 06/02/2017 1124   GLUCOSE 83 06/02/2017 1124   GLUCOSE 96 12/04/2015 0935   BUN 13 06/02/2017 1124   CREATININE 0.50 (L) 06/02/2017 1124   CALCIUM 9.4 06/02/2017 1124   GFRNONAA 109 06/02/2017 1124   GFRAA 125 06/02/2017 1124   Lab Results  Component Value Date   HGBA1C 5.5 02/23/2017   HGBA1C 5.4 10/08/2016   HGBA1C 5.4 06/03/2016   HGBA1C 5.6 12/04/2015   Lab Results  Component Value Date   INSULIN 4.8 02/23/2017   INSULIN 5.1 10/08/2016   INSULIN 7.4 06/03/2016   CBC    Component Value Date/Time   WBC 7.6 06/03/2016 1253   WBC 8.1 12/04/2015 0935   RBC 4.93 06/03/2016 1253   RBC 5.52 (H) 12/04/2015 0935    HGB 14.6 06/03/2016 1253   HGB 14.2 03/29/2010 1506   HCT 43.3 06/03/2016 1253   HCT 41.3 03/29/2010 1506   PLT 225.0 12/04/2015 0935   PLT 221 03/29/2010 1506   MCV 88 06/03/2016 1253   MCV 88.4 03/29/2010 1506   MCH 29.6 06/03/2016  1253   MCH 29.8 06/28/2010 0355   MCHC 33.7 06/03/2016 1253   MCHC 33.9 12/04/2015 0935   RDW 14.5 06/03/2016 1253   RDW 13.6 03/29/2010 1506   LYMPHSABS 1.5 06/03/2016 1253   LYMPHSABS 1.6 03/29/2010 1506   MONOABS 0.5 12/04/2015 0935   MONOABS 0.4 03/29/2010 1506   EOSABS 0.2 06/03/2016 1253   BASOSABS 0.0 06/03/2016 1253   BASOSABS 0.1 03/29/2010 1506   Iron/TIBC/Ferritin/ %Sat    Component Value Date/Time   IRON 65 01/22/2009 0000   IRONPCTSAT 21.8 01/22/2009 0000   Lipid Panel     Component Value Date/Time   CHOL 205 (H) 06/02/2017 1124   TRIG 170 (H) 06/02/2017 1124   HDL 40 06/02/2017 1124   CHOLHDL 5 12/04/2015 0935   VLDL 38.8 12/04/2015 0935   LDLCALC 131 (H) 06/02/2017 1124   LDLDIRECT 156.9 01/20/2008 0000   Hepatic Function Panel     Component Value Date/Time   PROT 6.6 06/02/2017 1124   ALBUMIN 4.4 06/02/2017 1124   AST 17 06/02/2017 1124   ALT 19 06/02/2017 1124   ALKPHOS 41 06/02/2017 1124   BILITOT 0.2 06/02/2017 1124   BILIDIR 0.1 12/01/2012 1035   IBILI NOT CALCULATED 06/28/2010 0355      Component Value Date/Time   TSH 1.790 06/02/2017 1124   TSH 1.550 06/03/2016 1253   TSH 2.27 12/04/2015 0935  Results for Kristin Rasmussen, Kristin Rasmussen (MRN [id number]) as of 07/01/2017 08:03  Ref. Range 06/02/2017 11:24  Vitamin D, 25-Hydroxy Latest Ref Range: 30.0 - 100.0 ng/mL 62.5    ASSESSMENT AND PLAN: Vitamin D deficiency - Plan: Vitamin D, Ergocalciferol, (DRISDOL) 50000 units CAPS capsule  Class 1 obesity with serious comorbidity and body mass index (BMI) of 34.0 to 34.9 in adult, unspecified obesity type  PLAN:  Vitamin D Deficiency [facility] was informed that low vitamin D levels contributes to fatigue and are associated with  obesity, breast, and colon cancer. Ajani agrees to continue taking prescription Vit D @50 ,000 IU every week #4, 90 days with no refill. She will follow up for routine testing of vitamin D, at least 2-3 times per year. She was informed of the risk of over-replacement of vitamin D and agrees to not increase her dose unless she discusses this with [place] first. Jazara agrees to follow up with our clinic in 3 months and we will recheck labs at that time.  We spent > than 50% of the 30 minute visit on the counseling as documented in the note.  Obesity Amelia is currently in the action stage of change. As such, her goal is to continue with weight loss efforts She has agreed to keep a food journal with 300-450 calories and 30+ grams of protein at supper daily and follow the Category 2 plan Jaskiran has been instructed to work up to a goal of 150 minutes of combined cardio and strengthening exercise per week for weight loss and overall health benefits. We discussed the following Behavioral Modification Strategies today: increasing lean protein intake, decreasing simple carbohydrates  and work on meal planning and easy cooking plans   Sharonna has agreed to follow up with our clinic in 3 months. She was informed of the importance of frequent follow up visits to maximize her success with intensive lifestyle modifications for her multiple health conditions.   OBESITY BEHAVIORAL INTERVENTION VISIT  Today's visit was # 18 out of 22.  Starting weight: 212 lbs Starting date: 06/03/16 Today's weight : 192 lbs  Today's date: 07/01/2017  Total lbs lost to date: 20 (Patients must lose 7 lbs in the first 6 months to continue with counseling)   ASK: We discussed the diagnosis of obesity with Joseph Pierini today and Sheilah agreed to give [place] permission to discuss obesity behavioral modification therapy today.  ASSESS: Keli has the diagnosis of obesity and her BMI today is 34.02 Evaline is in the action stage of  change   ADVISE: Evetta was educated on the multiple health risks of obesity as well as the benefit of weight loss to improve her health. She was advised of the need for long term treatment and the importance of lifestyle modifications.  AGREE: Multiple dietary modification options and treatment options were discussed and  [place] agreed to the above obesity treatment plan.  I, Trixie Dredge, am acting as transcriptionist for Dennard Nip, MD  I have reviewed the above documentation for accuracy and completeness, and I agree with the above. -Dennard Nip, MD

## 2017-07-27 ENCOUNTER — Other Ambulatory Visit: Payer: Self-pay | Admitting: Internal Medicine

## 2017-08-10 ENCOUNTER — Other Ambulatory Visit: Payer: Self-pay | Admitting: Internal Medicine

## 2017-09-17 ENCOUNTER — Other Ambulatory Visit (INDEPENDENT_AMBULATORY_CARE_PROVIDER_SITE_OTHER): Payer: Self-pay | Admitting: Family Medicine

## 2017-09-17 DIAGNOSIS — E559 Vitamin D deficiency, unspecified: Secondary | ICD-10-CM

## 2017-09-23 ENCOUNTER — Ambulatory Visit (INDEPENDENT_AMBULATORY_CARE_PROVIDER_SITE_OTHER): Payer: PRIVATE HEALTH INSURANCE | Admitting: Family Medicine

## 2017-09-23 ENCOUNTER — Encounter (INDEPENDENT_AMBULATORY_CARE_PROVIDER_SITE_OTHER): Payer: Self-pay

## 2017-11-17 LAB — HM MAMMOGRAPHY

## 2017-12-06 NOTE — Progress Notes (Signed)
Subjective:    Patient ID: Kristin Rasmussen, female    DOB: Dec 27, 1960, 57 y.o.   MRN: [id number]  HPI She is here for a physical exam.   She was following at the weight management clinic - her last visit was 06/2017.  She is no longer going. She has gained some weight back.    Lower back pain - she has chronic intermittent lower back pain.  She has taken baclofen in the past as needed depending on what she does.  It helps and wanted to get another prescription.  Right wrist pain - it started in April.  She is on the computer a lot.  She has medial wrist/distal forearm pain.  She bought a brace and is wearing it at night.  She has used icy hot.  There has not been improvement with conservative measures.   Medications and allergies reviewed with patient and updated if appropriate.  Patient Active Problem List   Diagnosis Date Noted  . Right wrist pain 12/07/2017  . Insulin resistance 10/08/2016  . Vitamin D deficiency 09/15/2016  . Class 1 obesity with serious comorbidity and body mass index (BMI) of 34.0 to 34.9 in adult 09/15/2016  . Osteopenia 04/19/2015  . Low back pain syndrome 09/19/2014  . Left supraspinatus tendonitis 10/18/2013  . Anxiety   . Metatarsalgia 08/13/2011  . DIVERTICULOSIS, COLON 01/20/2008  . SKIN CANCER, HX OF 01/20/2008  . Melanoma of skin ([place]) 10/28/2006  . HYPERLIPIDEMIA 10/28/2006  . GERD 10/28/2006    Current Outpatient Medications on File Prior to Visit  Medication Sig Dispense Refill  . ALPRAZolam (XANAX) 0.25 MG tablet Take 12.5 mg by mouth 2 (two) times daily.      . Ascorbic Acid (VITAMIN C) 500 MG CAPS Take by mouth.    . calcium citrate-vitamin D (CITRACAL+D) 315-200 MG-UNIT tablet Take 1 tablet by mouth 2 (two) times daily.    . cetirizine (ZYRTEC) 10 MG tablet Take 10 mg by mouth daily.    . diphenhydrAMINE (BENADRYL) 25 mg capsule Take 25 mg by mouth every 6 (six) hours as needed.    . docusate sodium (COLACE) 100 MG capsule Take 100 mg by  mouth daily as needed for mild constipation.    [name] escitalopram (LEXAPRO) 20 MG tablet Take 20 mg by mouth daily.    [name] esomeprazole (NEXIUM) 40 MG capsule TAKE 1 CAPSULE BY MOUTH ONCE DAILY 90 capsule 1  . estradiol (VIVELLE-DOT) 0.075 MG/24HR Apply 1 patch externally 2 times a week  5  . magnesium hydroxide (MILK OF MAGNESIA) 400 MG/5ML suspension Take by mouth daily as needed for mild constipation.    . Multiple Vitamin (MULTIVITAMIN) tablet Take 1 tablet by mouth daily.      [name] oxybutynin (DITROPAN-XL) 5 MG 24 hr tablet Take 5 mg by mouth at bedtime.    . pravastatin (PRAVACHOL) 20 MG tablet TAKE 1 TABLET BY MOUTH EVERY DAY 90 tablet 1   No current facility-administered medications on file prior to visit.     Past Medical History:  Diagnosis Date  . Anemia 2004   HCT 34.7  . Anxiety    follows with psyc for same  . Back pain   . Basal cell cancer    x3; Dr Tonia Brooms  . Bladder leak   . Chest pain 06/2010   Costochondritis  . Diverticulosis   . Gallbladder problem   . GERD (gastroesophageal reflux disease)   . Hyperlipidemia   . IBS (irritable bowel syndrome)   .  Melanoma ([place]) 2006   x1  . PCOS (polycystic ovarian syndrome)   . Plantar fasciitis 08/13/2011   Left is significant and RT is mild by [place] criteria   . Spasm of esophagus   . Swelling   . Thrombocytopenia ([place]) 2002   133,000 platelets    Past Surgical History:  Procedure Laterality Date  . CHOLECYSTECTOMY  2002   [facility]  . COLONOSCOPY    . MELANOMA EXCISION     LUE 2006  [facility]; Derm : Dr Crista Luria  . SHOULDER SURGERY     Left  . TONSILLECTOMY    . TOTAL ABDOMINAL HYSTERECTOMY  1991   abnormal PAP hx    Social History   Socioeconomic History  . Marital status: Married    Spouse name: Not on file  . Number of children: 3  . Years of education: 51  . Highest education level: Not on file  Occupational History  . 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  Financial resource strain: Not on file  . Food insecurity:    Worry: Not on file    Inability: Not on file  . Transportation needs:    Medical: Not on file    Non-medical: Not on file  Tobacco Use  . Smoking status: Never Smoker  . Smokeless tobacco: Never Used  Substance and Sexual Activity  . Alcohol use: Yes    Comment: rarely, only holiday's  . Drug use: No  . Sexual activity: Not on file  Lifestyle  . Physical activity:    Days per week: Not on file    Minutes per session: Not on file  . Stress: Not on file  Relationships  . Social connections:    Talks on phone: Not on file    Gets together: Not on file    Attends religious service: Not on file    Active member of club or organization: Not on file    Attends meetings of clubs or organizations: Not on file    Relationship status: Not on file  Other Topics Concern  . Not on file  Social History Narrative   Does some yard work and walking for exercise - couple of days a week    Family History  Problem Relation Age of Onset  . Hypertension Father   . Colon polyps Father   . GER disease Father   . Skin cancer Father   . Ovarian cancer Paternal Aunt   . Diabetes Mother   . Hypertension Mother   . Obesity Mother   . Heart attack Maternal Grandmother 76  . Diabetes Maternal Grandmother   . Heart attack Maternal Grandfather 69  . Stroke Maternal Grandfather 39  . Colon cancer Paternal Grandfather        unsure age    Review of Systems  Constitutional: Negative for chills and fever.  Eyes: Negative for visual disturbance.  Respiratory: Negative for cough, shortness of breath and wheezing.   Cardiovascular: Negative for chest pain, palpitations and leg swelling.  Gastrointestinal: Negative for abdominal pain, blood in stool, constipation, diarrhea and nausea.  Genitourinary: Negative for dysuria and hematuria.  Musculoskeletal: Positive for arthralgias (right wrist pain) and back pain (chronic, intermittent ).    Skin: Negative for color change and rash.  Neurological: Negative for light-headedness and headaches.  Psychiatric/Behavioral: Positive for dysphoric mood (controlled). The patient is nervous/anxious (controlled).        Objective:   Vitals:  12/07/17 0900  BP: 120/70  Pulse: 73  Resp: 16  Temp: 98 F (36.7 C)  SpO2: 98%   Filed Weights   12/07/17 0900  Weight: 197 lb (89.4 kg)   Body mass index is 34.9 kg/m.  BP Readings from Last 3 Encounters:  12/07/17 120/70  07/01/17 (!) 95/55  06/02/17 104/68    Wt Readings from Last 3 Encounters:  12/07/17 197 lb (89.4 kg)  07/01/17 192 lb (87.1 kg)  06/02/17 188 lb (85.3 kg)     Physical Exam Constitutional: She appears well-developed and well-nourished. No distress.  HENT:  Head: Normocephalic and atraumatic.  Right Ear: External ear normal. Normal ear canal and TM Left Ear: External ear normal.  Normal ear canal and TM Mouth/Throat: Oropharynx is clear and moist.  Eyes: Conjunctivae and EOM are normal.  Neck: Neck supple. No tracheal deviation present. No thyromegaly present.  No carotid bruit  Cardiovascular: Normal rate, regular rhythm and normal heart sounds.   No murmur heard.  No edema. Pulmonary/Chest: Effort normal and breath sounds normal. No respiratory distress. She has no wheezes. She has no rales.  Breast: deferred to Gyn Abdominal: Soft. She exhibits no distension. There is no tenderness.  Lymphadenopathy: She has no cervical adenopathy.  Msk; no right wrist deformity, FROM - some pain with certain movements Skin: Skin is warm and dry. She is not diaphoretic.  Psychiatric: She has a normal mood and affect. Her behavior is normal.        Assessment & Plan:   Physical exam: Screening blood work   ordered Immunizations   Flu vaccine up to date, discussed shingles Colonoscopy   Up to date  Mammogram   Up to date   Gyn  Up to date  Dexa     Due - ordered - schedule at check out - will do in one  year with CPE given very mild osteopenia Eye exams  Up to date  EKG      Done 06/2016 Exercise  Yard work -- advised to add in more regular cardio Weight - encouraged weight loss Skin  No concerns, sees derm annually Substance abuse    none  See Problem List for Assessment and Plan of chronic medical problems.   FU in one year

## 2017-12-06 NOTE — Patient Instructions (Addendum)
Tests ordered today. Your results will be released to [place] (or called to you) after review, usually within 72hours after test completion. If any changes need to be made, you will be notified at that same time.  All other Health Maintenance issues reviewed.   All recommended immunizations and age-appropriate screenings are up-to-date or discussed.  No immunizations administered today.   Medications reviewed and updated.  Changes include :   Try diclofenac gel on the wrist.   Your prescription(s) have been submitted to your pharmacy. Please take as directed and contact our office if you believe you are having problem(s) with the medication(s).  A referral was ordered for hand orthopedics  Please followup in one year   Health Maintenance, Female Adopting a healthy lifestyle and getting preventive care can go a long way to promote health and wellness. Talk with your health care provider about what schedule of regular examinations is right for you. This is a good chance for you to check in with your provider about disease prevention and staying healthy. In between checkups, there are plenty of things you can do on your own. Experts have done a lot of research about which lifestyle changes and preventive measures are most likely to keep you healthy. Ask your health care provider for more information. Weight and diet Eat a healthy diet  Be sure to include plenty of vegetables, fruits, low-fat dairy products, and lean protein.  Do not eat a lot of foods high in solid fats, added sugars, or salt.  Get regular exercise. This is one of the most important things you can do for your health. ? Most adults should exercise for at least 150 minutes each week. The exercise should increase your heart rate and make you sweat (moderate-intensity exercise). ? Most adults should also do strengthening exercises at least twice a week. This is in addition to the moderate-intensity exercise.  Maintain a  healthy weight  Body mass index (BMI) is a measurement that can be used to identify possible weight problems. It estimates body fat based on height and weight. Your health care provider can help determine your BMI and help you achieve or maintain a healthy weight.  For females 87 years of age and older: ? A BMI below 18.5 is considered underweight. ? A BMI of 18.5 to 24.9 is normal. ? A BMI of 25 to 29.9 is considered overweight. ? A BMI of 30 and above is considered obese.  Watch levels of cholesterol and blood lipids  You should start having your blood tested for lipids and cholesterol at 57 years of age, then have this test every 5 years.  You may need to have your cholesterol levels checked more often if: ? Your lipid or cholesterol levels are high. ? You are older than 57 years of age. ? You are at high risk for heart disease.  Cancer screening Lung Cancer  Lung cancer screening is recommended for adults 49-39 years old who are at high risk for lung cancer because of a history of smoking.  A yearly low-dose CT scan of the lungs is recommended for people who: ? Currently smoke. ? Have quit within the past 15 years. ? Have at least a 30-pack-year history of smoking. A pack year is smoking an average of one pack of cigarettes a day for 1 year.  Yearly screening should continue until it has been 15 years since you quit.  Yearly screening should stop if you develop a health problem that would prevent  you from having lung cancer treatment.  Breast Cancer  Practice breast self-awareness. This means understanding how your breasts normally appear and feel.  It also means doing regular breast self-exams. Let your health care provider know about any changes, no matter how small.  If you are in your 20s or 30s, you should have a clinical breast exam (CBE) by a health care provider every 1-3 years as part of a regular health exam.  If you are 61 or older, have a CBE every year. Also  consider having a breast X-ray (mammogram) every year.  If you have a family history of breast cancer, talk to your health care provider about genetic screening.  If you are at high risk for breast cancer, talk to your health care provider about having an MRI and a mammogram every year.  Breast cancer gene (BRCA) assessment is recommended for women who have family members with BRCA-related cancers. BRCA-related cancers include: ? Breast. ? Ovarian. ? Tubal. ? Peritoneal cancers.  Results of the assessment will determine the need for genetic counseling and BRCA1 and BRCA2 testing.  Cervical Cancer Your health care provider may recommend that you be screened regularly for cancer of the pelvic organs (ovaries, uterus, and vagina). This screening involves a pelvic examination, including checking for microscopic changes to the surface of your cervix (Pap test). You may be encouraged to have this screening done every 3 years, beginning at age 69.  For women ages 78-65, health care providers may recommend pelvic exams and Pap testing every 3 years, or they may recommend the Pap and pelvic exam, combined with testing for human papilloma virus (HPV), every 5 years. Some types of HPV increase your risk of cervical cancer. Testing for HPV may also be done on women of any age with unclear Pap test results.  Other health care providers may not recommend any screening for nonpregnant women who are considered low risk for pelvic cancer and who do not have symptoms. Ask your health care provider if a screening pelvic exam is right for you.  If you have had past treatment for cervical cancer or a condition that could lead to cancer, you need Pap tests and screening for cancer for at least 20 years after your treatment. If Pap tests have been discontinued, your risk factors (such as having a new sexual partner) need to be reassessed to determine if screening should resume. Some women have medical problems that  increase the chance of getting cervical cancer. In these cases, your health care provider may recommend more frequent screening and Pap tests.  Colorectal Cancer  This type of cancer can be detected and often prevented.  Routine colorectal cancer screening usually begins at 57 years of age and continues through 57 years of age.  Your health care provider may recommend screening at an earlier age if you have risk factors for colon cancer.  Your health care provider may also recommend using home test kits to check for hidden blood in the stool.  A small camera at the end of a tube can be used to examine your colon directly (sigmoidoscopy or colonoscopy). This is done to check for the earliest forms of colorectal cancer.  Routine screening usually begins at age 52.  Direct examination of the colon should be repeated every 5-10 years through 57 years of age. However, you may need to be screened more often if early forms of precancerous polyps or small growths are found.  Skin Cancer  Check your skin  from head to toe regularly.  Tell your health care provider about any new moles or changes in moles, especially if there is a change in a mole's shape or color.  Also tell your health care provider if you have a mole that is larger than the size of a pencil eraser.  Always use sunscreen. Apply sunscreen liberally and repeatedly throughout the day.  Protect yourself by wearing long sleeves, pants, a wide-brimmed hat, and sunglasses whenever you are outside.  Heart disease, diabetes, and high blood pressure  High blood pressure causes heart disease and increases the risk of stroke. High blood pressure is more likely to develop in: ? People who have blood pressure in the high end of the normal range (130-139/85-89 mm Hg). ? People who are overweight or obese. ? People who are African American.  If you are 64-67 years of age, have your blood pressure checked every 3-5 years. If you are 68  years of age or older, have your blood pressure checked every year. You should have your blood pressure measured twice-once when you are at a hospital or clinic, and once when you are not at a hospital or clinic. Record the average of the two measurements. To check your blood pressure when you are not at a hospital or clinic, you can use: ? An automated blood pressure machine at a pharmacy. ? A home blood pressure monitor.  If you are between 38 years and 26 years old, ask your health care provider if you should take aspirin to prevent strokes.  Have regular diabetes screenings. This involves taking a blood sample to check your fasting blood sugar level. ? If you are at a normal weight and have a low risk for diabetes, have this test once every three years after 57 years of age. ? If you are overweight and have a high risk for diabetes, consider being tested at a younger age or more often. Preventing infection Hepatitis B  If you have a higher risk for hepatitis B, you should be screened for this virus. You are considered at high risk for hepatitis B if: ? You were born in a country where hepatitis B is common. Ask your health care provider which countries are considered high risk. ? Your parents were born in a high-risk country, and you have not been immunized against hepatitis B (hepatitis B vaccine). ? You have HIV or AIDS. ? You use needles to inject street drugs. ? You live with someone who has hepatitis B. ? You have had sex with someone who has hepatitis B. ? You get hemodialysis treatment. ? You take certain medicines for conditions, including cancer, organ transplantation, and autoimmune conditions.  Hepatitis C  Blood testing is recommended for: ? Everyone born from [birth year] through 1965. ? Anyone with known risk factors for hepatitis C.  Sexually transmitted infections (STIs)  You should be screened for sexually transmitted infections (STIs) including gonorrhea and chlamydia  if: ? You are sexually active and are younger than 57 years of age. ? You are older than 57 years of age and your health care provider tells you that you are at risk for this type of infection. ? Your sexual activity has changed since you were last screened and you are at an increased risk for chlamydia or gonorrhea. Ask your health care provider if you are at risk.  If you do not have HIV, but are at risk, it may be recommended that you take a prescription medicine daily to  prevent HIV infection. This is called pre-exposure prophylaxis (PrEP). You are considered at risk if: ? You are sexually active and do not regularly use condoms or know the HIV status of your partner(s). ? You take drugs by injection. ? You are sexually active with a partner who has HIV.  Talk with your health care provider about whether you are at high risk of being infected with HIV. If you choose to begin PrEP, you should first be tested for HIV. You should then be tested every 3 months for as long as you are taking PrEP. Pregnancy  If you are premenopausal and you may become pregnant, ask your health care provider about preconception counseling.  If you may become pregnant, take 400 to 800 micrograms (mcg) of folic acid every day.  If you want to prevent pregnancy, talk to your health care provider about birth control (contraception). Osteoporosis and menopause  Osteoporosis is a disease in which the bones lose minerals and strength with aging. This can result in serious bone fractures. Your risk for osteoporosis can be identified using a bone density scan.  If you are 53 years of age or older, or if you are at risk for osteoporosis and fractures, ask your health care provider if you should be screened.  Ask your health care provider whether you should take a calcium or vitamin D supplement to lower your risk for osteoporosis.  Menopause may have certain physical symptoms and risks.  Hormone replacement therapy  may reduce some of these symptoms and risks. Talk to your health care provider about whether hormone replacement therapy is right for you. Follow these instructions at home:  Schedule regular health, dental, and eye exams.  Stay current with your immunizations.  Do not use any tobacco products including cigarettes, chewing tobacco, or electronic cigarettes.  If you are pregnant, do not drink alcohol.  If you are breastfeeding, limit how much and how often you drink alcohol.  Limit alcohol intake to no more than 1 drink per day for nonpregnant women. One drink equals 12 ounces of beer, 5 ounces of wine, or 1 ounces of hard liquor.  Do not use street drugs.  Do not share needles.  Ask your health care provider for help if you need support or information about quitting drugs.  Tell your health care provider if you often feel depressed.  Tell your health care provider if you have ever been abused or do not feel safe at home. This information is not intended to replace advice given to you by your health care provider. Make sure you discuss any questions you have with your health care provider. Document Released: 08/05/2010 Document Revised: 06/28/2015 Document Reviewed: 10/24/2014 Elsevier Interactive Patient Education  [employer].

## 2017-12-07 ENCOUNTER — Ambulatory Visit (INDEPENDENT_AMBULATORY_CARE_PROVIDER_SITE_OTHER): Payer: PRIVATE HEALTH INSURANCE | Admitting: Internal Medicine

## 2017-12-07 ENCOUNTER — Other Ambulatory Visit (INDEPENDENT_AMBULATORY_CARE_PROVIDER_SITE_OTHER): Payer: PRIVATE HEALTH INSURANCE

## 2017-12-07 ENCOUNTER — Encounter: Payer: Self-pay | Admitting: Internal Medicine

## 2017-12-07 VITALS — BP 120/70 | HR 73 | Temp 98.0°F | Resp 16 | Ht 63.0 in | Wt 197.0 lb

## 2017-12-07 DIAGNOSIS — E8881 Metabolic syndrome: Secondary | ICD-10-CM

## 2017-12-07 DIAGNOSIS — K219 Gastro-esophageal reflux disease without esophagitis: Secondary | ICD-10-CM

## 2017-12-07 DIAGNOSIS — E782 Mixed hyperlipidemia: Secondary | ICD-10-CM

## 2017-12-07 DIAGNOSIS — C439 Malignant melanoma of skin, unspecified: Secondary | ICD-10-CM

## 2017-12-07 DIAGNOSIS — M545 Low back pain, unspecified: Secondary | ICD-10-CM

## 2017-12-07 DIAGNOSIS — G8929 Other chronic pain: Secondary | ICD-10-CM

## 2017-12-07 DIAGNOSIS — M85852 Other specified disorders of bone density and structure, left thigh: Secondary | ICD-10-CM

## 2017-12-07 DIAGNOSIS — M25531 Pain in right wrist: Secondary | ICD-10-CM

## 2017-12-07 DIAGNOSIS — Z0000 Encounter for general adult medical examination without abnormal findings: Secondary | ICD-10-CM

## 2017-12-07 LAB — CBC WITH DIFFERENTIAL/PLATELET
BASOS ABS: 0 10*3/uL (ref 0.0–0.1)
BASOS PCT: 0.5 % (ref 0.0–3.0)
EOS ABS: 0.2 10*3/uL (ref 0.0–0.7)
Eosinophils Relative: 2.8 % (ref 0.0–5.0)
HEMATOCRIT: 47.4 % — AB (ref 36.0–46.0)
HEMOGLOBIN: 16.1 g/dL — AB (ref 12.0–15.0)
LYMPHS PCT: 20.6 % (ref 12.0–46.0)
Lymphs Abs: 1.5 10*3/uL (ref 0.7–4.0)
MCHC: 34 g/dL (ref 30.0–36.0)
MCV: 88.5 fl (ref 78.0–100.0)
MONOS PCT: 5.5 % (ref 3.0–12.0)
Monocytes Absolute: 0.4 10*3/uL (ref 0.1–1.0)
NEUTROS ABS: 5.1 10*3/uL (ref 1.4–7.7)
Neutrophils Relative %: 70.6 % (ref 43.0–77.0)
PLATELETS: 242 10*3/uL (ref 150.0–400.0)
RBC: 5.35 Mil/uL — ABNORMAL HIGH (ref 3.87–5.11)
RDW: 14.5 % (ref 11.5–15.5)
WBC: 7.2 10*3/uL (ref 4.0–10.5)

## 2017-12-07 LAB — COMPREHENSIVE METABOLIC PANEL
ALBUMIN: 4.2 g/dL (ref 3.5–5.2)
ALT: 14 U/L (ref 0–35)
AST: 13 U/L (ref 0–37)
Alkaline Phosphatase: 40 U/L (ref 39–117)
BUN: 9 mg/dL (ref 6–23)
CALCIUM: 9.3 mg/dL (ref 8.4–10.5)
CHLORIDE: 102 meq/L (ref 96–112)
CO2: 30 meq/L (ref 19–32)
Creatinine, Ser: 0.55 mg/dL (ref 0.40–1.20)
GFR: 120.99 mL/min (ref >60.00)
Glucose, Bld: 98 mg/dL (ref 70–99)
Potassium: 4.3 mEq/L (ref 3.5–5.1)
Sodium: 139 mEq/L (ref 135–145)
Total Bilirubin: 0.4 mg/dL (ref 0.2–1.2)
Total Protein: 6.9 g/dL (ref 6.0–8.3)

## 2017-12-07 LAB — TSH: TSH: 2.22 u[IU]/mL (ref 0.35–4.50)

## 2017-12-07 LAB — LIPID PANEL
CHOL/HDL RATIO: 4
CHOLESTEROL: 178 mg/dL (ref 0–200)
HDL: 41.5 mg/dL (ref >39.00)
LDL CALC: 104 mg/dL — AB (ref 0–99)
NonHDL: 136.99
Triglycerides: 164 mg/dL — ABNORMAL HIGH (ref 0.0–149.0)
VLDL: 32.8 mg/dL (ref 0.0–40.0)

## 2017-12-07 LAB — HEMOGLOBIN A1C: Hgb A1c MFr Bld: 5.6 % (ref 4.6–6.5)

## 2017-12-07 MED ORDER — BACLOFEN 10 MG PO TABS: 10.0000 mg | ORAL_TABLET | Freq: Three times a day (TID) | ORAL | 5 refills | Status: DC

## 2017-12-07 MED ORDER — DICLOFENAC SODIUM 1 % TD GEL: 4.0000 g | Freq: Four times a day (QID) | TRANSDERMAL | 5 refills | Status: DC

## 2017-12-07 NOTE — Assessment & Plan Note (Signed)
Sees derm annually 

## 2017-12-07 NOTE — Assessment & Plan Note (Addendum)
Takes baclofen as needed - ok to continue  Continue to take only as needed refilled

## 2017-12-07 NOTE — Assessment & Plan Note (Signed)
Likely tendinitis Trial diclofenac gel Refer to ortho Continue brace Revise activities

## 2017-12-07 NOTE — Assessment & Plan Note (Signed)
Check a1c 

## 2017-12-07 NOTE — Assessment & Plan Note (Signed)
GERD controlled Continue daily medication - symptomatic if she misses day Discussed negative side effects with long term use

## 2017-12-07 NOTE — Assessment & Plan Note (Signed)
dexa due - ordered Stressed regular exercise Taking calciuma and vitamin d

## 2017-12-07 NOTE — Assessment & Plan Note (Signed)
Check lipid panel  Continue daily statin Regular exercise and healthy diet encouraged  

## 2017-12-08 ENCOUNTER — Encounter: Payer: Self-pay | Admitting: Internal Medicine

## 2017-12-10 ENCOUNTER — Telehealth: Payer: Self-pay | Admitting: Internal Medicine

## 2017-12-10 NOTE — Telephone Encounter (Signed)
Copied from [place] [id number]. Topic: Quick Communication - Rx Refill/Question >> Dec 10, 2017  4:43 PM Kristin Rasmussen wrote: Medication: esomeprazole ([place]) 40 MG capsule  Pt received a letter from her insurance that this medication will no longer be covered as of February 03, 2018.  Pt called her insurance and they told her that it is possible for PCP could call and request an exception review (phone number: [phone]).  Pt wants to know if Dr. Quay Burow would be able to do that for her.  Pt can be reached at [phone]

## 2018-01-04 ENCOUNTER — Telehealth: Payer: Self-pay

## 2018-01-04 NOTE — Telephone Encounter (Signed)
FYI

## 2018-01-04 NOTE — Telephone Encounter (Signed)
Copied from [place] [id number]. Topic: General - Other >> Dec 09, 2017  9:15 AM Oneta Rack wrote: Relation to pt: self  Call back [phone]   Reason for call:  FYI-  Patient scheduled for her physical for 12/09/2018 and would like to have her bone density done on the same day.

## 2018-01-21 NOTE — Telephone Encounter (Signed)
Spoke with insurance and started an appeal for nexium to get it approved. Waiting on faxed response for approval.

## 2018-01-28 NOTE — Telephone Encounter (Signed)
[name] with [facility] states as of 02/03/18 this med with no longer be formulary for this pt. She states they do need documentation sent over where pt tried omeprazole and failed. Also pt will need try and fail at least one of the following: lansoprazole, pantoprazole, or rabetrazole. Fax#: [id number] CB#: [phone]

## 2018-02-01 ENCOUNTER — Other Ambulatory Visit: Payer: Self-pay | Admitting: Internal Medicine

## 2018-02-01 MED ORDER — PRAVASTATIN SODIUM 20 MG PO TABS: 20.0000 mg | ORAL_TABLET | Freq: Every day | ORAL | 1 refills | Status: DC

## 2018-02-01 NOTE — Telephone Encounter (Signed)
Requested Prescriptions  Pending Prescriptions Disp Refills  . pravastatin (PRAVACHOL) 20 MG tablet 90 tablet 1    Sig: Take 1 tablet (20 mg total) by mouth daily.     Cardiovascular:  Antilipid - Statins Failed - 02/01/2018  4:09 PM      Failed - LDL in normal range and within 360 days    LDL Calculated  Date Value Ref Range Status  06/02/2017 131 (H) 0 - 99 mg/dL Final   LDL Cholesterol  Date Value Ref Range Status  12/07/2017 104 (H) 0 - 99 mg/dL Final         Failed - Triglycerides in normal range and within 360 days    Triglycerides  Date Value Ref Range Status  12/07/2017 164.0 (H) 0.0 - 149.0 mg/dL Final    Comment:    Normal:  <150 mg/dLBorderline High:  150 - 199 mg/dL         Passed - Total Cholesterol in normal range and within 360 days    Cholesterol, Total  Date Value Ref Range Status  06/02/2017 205 (H) 100 - 199 mg/dL Final   Cholesterol  Date Value Ref Range Status  12/07/2017 178 0 - 200 mg/dL Final    Comment:    ATP III Classification       Desirable:  < 200 mg/dL               Borderline High:  200 - 239 mg/dL          High:  > = 240 mg/dL         Passed - HDL in normal range and within 360 days    HDL  Date Value Ref Range Status  12/07/2017 41.50 >39.00 mg/dL Final  06/02/2017 40 >39 mg/dL Final         Passed - Patient is not pregnant      Passed - Valid encounter within last 12 months    Recent Outpatient Visits          1 month ago Preventative health care   [place], Claudina Lick, MD   1 year ago Preventative health care   [place], Claudina Lick, MD   2 years ago Preventative health care   [place], MD   3 years ago Preventative health care   [place], MD   4 years ago Routine general medical examination at a health care facility   [place],  FNP      Future Appointments            In 10 months [place], Claudina Lick, MD [place]

## 2018-02-01 NOTE — Telephone Encounter (Signed)
Note stating pt has tried both omeprazole and pantoprazole has been printed and faxed.

## 2018-02-01 NOTE — Telephone Encounter (Unsigned)
Copied from [place] [id number]. Topic: Quick Communication - Rx Refill/Question >> Feb 01, 2018 10:16 AM Percell Belt A wrote: Medication: pravastatin (PRAVACHOL) 20 MG tablet [[id number]]  Has the patient contacted their pharmacy? No - pt transfer med to new pharmacy  (Agent: If no, request that the patient contact the pharmacy for the refill.) (Agent: If yes, when and what did the pharmacy advise?)  Preferred Pharmacy (with phone number or street name): [facility], [phone] (Phone)   Agent: Please be advised that RX refills may take up to 3 business days. We ask that you follow-up with your pharmacy.

## 2018-02-04 ENCOUNTER — Ambulatory Visit: Payer: PRIVATE HEALTH INSURANCE | Admitting: Sports Medicine

## 2018-02-04 ENCOUNTER — Encounter: Payer: Self-pay | Admitting: Sports Medicine

## 2018-02-04 VITALS — BP 129/53 | Ht 63.5 in | Wt 195.0 lb

## 2018-02-04 DIAGNOSIS — M25531 Pain in right wrist: Secondary | ICD-10-CM

## 2018-02-04 MED ORDER — METHYLPREDNISOLONE ACETATE 40 MG/ML IJ SUSP
20.0000 mg | Freq: Once | INTRAMUSCULAR | Status: AC
  Administered 2018-02-04: 20 mg via INTRA_ARTICULAR

## 2018-02-04 NOTE — Progress Notes (Signed)
  Kristin Rasmussen - 58 y.o. female MRN [id number]  Date of birth: [birth year]    SUBJECTIVE:      Chief Complaint:/ HPI:  58 year old female with right ulnar-sided wrist pain since May 2019.  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  In November 2019 she was seen by Dr. Fredna Dow.  She was started on Mobic, a prednisone taper, and a brace for 6 weeks.  Since that time she notes no improvement in her symptoms.  Her pain is aggravated with using her wrist, particularly with repeated extension.  She does get some temporary relief with a volar wrist splint that she has.  She denies any local swelling erythema.  No numbness or tingling.  No  skin changes   ROS:     See HPI  PERTINENT  PMH / PSH FH / / SH:  Past Medical, Surgical, Social, and Family History Reviewed & Updated in the EMR.    OBJECTIVE: BP (!) 129/53   Ht 5' 3.5" (1.613 m)   Wt 195 lb (88.5 kg)   BMI 34.00 kg/m   Physical Exam:  Vital signs are reviewed.  GEN: Alert and oriented, NAD Pulm: Breathing unlabored PSY: normal mood, congruent affect  MSK: Right wrist/hand: Inspection: No obvious deformity. No swelling, erythema or bruising Palpation: Tenderness along the course of the ECU tendon ROM: Full ROM of the digits and wrist without pain Strength: 5/5 strength in the forearm, wrist and interosseus muscles.  Mild pain with resisted wrist extension Neurovascular: NV intact  MSK [place]: Limited ultrasound of the right wrist shows significant tenosynovitis of the ECU.  No tears seen within the tendon.  There is also trace fluid seen within the fourth dorsal compartment but is asymptomatic.   ASSESSMENT & PLAN:  1.  Right wrist pain secondary to ECU tenosynovitis -Steroid injection performed under ultrasound guidance as described below - Continue volar wrist splint for the next 5 days then may transition to a soft the wrist wrap. - Follow-up in 4 to 6 weeks.  Consider  adding nitroglycerin patch if symptoms persist   Procedure performed: Right extensor carpi ulnaris tendon injection, US guided  Consent obtained and verified. Time-out conducted. Noted no overlying erythema, induration, or other signs of local infection. The  right ECU tendon was identified with ultrasound.  The overlying skin was prepped in a sterile fashion. Topical analgesic spray: Ethyl chloride. Needle: 30-gauge, 1 inch Using an in plane approach the needle was advanced into the tendon sheath and medications were injected.  Completed without difficulty. Meds: Depo-Medrol 20 mg, 0.5 cc lidocaine  I observed and examined the patient with the SM Fellow and agree with assessment and plan.  Note reviewed and modified by me. Stefanie Libel, MD

## 2018-02-04 NOTE — Assessment & Plan Note (Signed)
Splinting CSI into tendon sheath Reduce work stress  Softer but supportive splint provided

## 2018-02-08 NOTE — Telephone Encounter (Signed)
Pt calling to speak with Lovena Le about her Nexium please call pt at [phone]

## 2018-02-08 NOTE — Telephone Encounter (Signed)
LVM for pt to call back.

## 2018-02-08 NOTE — Telephone Encounter (Signed)
Patient returning call to [facility]. Please advise.

## 2018-02-10 NOTE — Telephone Encounter (Signed)
Spoke with pt. Will work with insurance company to see what I can do to get medication covered.

## 2018-02-11 ENCOUNTER — Other Ambulatory Visit: Payer: Self-pay | Admitting: Internal Medicine

## 2018-02-11 NOTE — Telephone Encounter (Signed)
Spoke with insurance company directly to let them know exactly what the patient has tried and failed. Pt had informed me of acid reflux medications that she has tried that were not listed on her chart. Did an appeal and was told they will fax me back with any extra questions if needed or the answer of if it is covered or not. LVM for pt letting her know.

## 2018-03-11 ENCOUNTER — Ambulatory Visit: Payer: PRIVATE HEALTH INSURANCE | Admitting: Sports Medicine

## 2018-03-11 ENCOUNTER — Encounter: Payer: Self-pay | Admitting: Sports Medicine

## 2018-03-11 VITALS — BP 116/78 | Ht 63.5 in | Wt 200.0 lb

## 2018-03-11 DIAGNOSIS — M25531 Pain in right wrist: Secondary | ICD-10-CM

## 2018-03-11 MED ORDER — NITROGLYCERIN 0.2 MG/HR TD PT24: MEDICATED_PATCH | TRANSDERMAL | 1 refills | Status: DC

## 2018-03-11 NOTE — Patient Instructions (Addendum)
I am glad to hear your pain is improving. We will start you on nitroglycerin patches. Do the home exercises for wrist extension using a soup can as we discussed Continue with the brace as needed with activity NSAIDs and ice as needed We will have you follow-up in about 2 months   Nitroglycerin Protocol   Apply 1/4 nitroglycerin patch to affected area daily.  Change position of patch within the affected area every 24 hours.  You may experience a headache during the first 1-2 weeks of using the patch, these should subside.  If you experience headaches after beginning nitroglycerin patch treatment, you may take your preferred over the counter pain reliever.  Another side effect of the nitroglycerin patch is skin irritation or rash related to patch adhesive.  Please notify our office if you develop more severe headaches or rash, and stop the patch.  Tendon healing with nitroglycerin patch may require 12 to 24 weeks depending on the extent of injury.  Men should not use if taking Viagra, Cialis, or Levitra.   Do not use if you have migraines or rosacea.

## 2018-03-11 NOTE — Assessment & Plan Note (Signed)
Improvement in symptoms  Add NTG

## 2018-03-11 NOTE — Progress Notes (Signed)
  Kristin Rasmussen - 58 y.o. female MRN [id number]  Date of birth: Jun 17, 1960    SUBJECTIVE:      Chief Complaint: Right wrist pain, ECU tendinitis  HPI:  58 year old female with right wrist pain secondary to ECU tendinitis.  She was seen about 4 weeks ago and treated with a steroid injection.  She reports good response to the injection.  Pain is very minimal with daily use.  She continues to wear wrist brace while at work and more active.  He does report some mild occasional swelling.  No erythema or bruising.  No weakness.   ROS:     See HPI  PERTINENT  PMH / PSH FH / / SH:  Past Medical, Surgical, Social, and Family History Reviewed & Updated in the EMR.    OBJECTIVE: BP 116/78   Ht 5' 3.5" (1.613 m)   Wt 200 lb (90.7 kg)   BMI 34.87 kg/m   Physical Exam:  Vital signs are reviewed.  GEN: Alert and oriented, NAD Pulm: Breathing unlabored PSY: normal mood, congruent affect  MSK: Right wrist: No obvious swelling or erythema Full range of motion with 5/5 strength.  No significant pain with strength testing She continues to be very tender over the ECU. N/V intact  MSK [place]: Limited ultrasound of the right wrist.  ECU visualized.  She continues to have tenosynovitis.  Ultimately this appears relatively unchanged from previous.  She does have some slight hypoechoic change in the tendon but no obvious tears.  ASSESSMENT & PLAN:  1.  Right wrist pain secondary to ECU tendinitis-overall she is improving symptomatically.  She continues to have evidence of tenosynovitis on ultrasound. -We will start on nitroglycerin patches - Home exercises for wrist extension -Continue brace as needed -NSAIDs and ice as needed -Follow-up in about 2 months  I observed and examined the patient with the SM Fellow and agree with assessment and plan.  Note reviewed and modified by me. Stefanie Libel, MD

## 2018-06-24 ENCOUNTER — Encounter: Payer: Self-pay | Admitting: Sports Medicine

## 2018-06-24 ENCOUNTER — Ambulatory Visit: Payer: PRIVATE HEALTH INSURANCE | Admitting: Sports Medicine

## 2018-06-24 ENCOUNTER — Other Ambulatory Visit: Payer: Self-pay

## 2018-06-24 VITALS — BP 124/78 | Ht 63.5 in | Wt 200.0 lb

## 2018-06-24 DIAGNOSIS — M25531 Pain in right wrist: Secondary | ICD-10-CM

## 2018-06-24 MED ORDER — METHYLPREDNISOLONE ACETATE 40 MG/ML IJ SUSP
20.0000 mg | Freq: Once | INTRAMUSCULAR | Status: AC
  Administered 2018-06-24: 20 mg via INTRA_ARTICULAR

## 2018-06-24 NOTE — Progress Notes (Signed)
  Kristin Rasmussen - 58 y.o. female MRN [id number]  Date of birth: 08-24-1960    SUBJECTIVE:      Chief Complaint: Right wrist pain  HPI:  58 year old female presents with right wrist pain.  Pain is similar as her previous visit.  She was seen 3 months ago.  We were treating her for an ECU tenosynovitis.  Steroid injection done in January seem to help quite a bit but pain persistent due to repetitive movements at work.  She did get about 2 months of resolution of her symptoms, but they have now returned for about the past 3 weeks.  She reports 5/10 pain which is worse at the end of the day.  She does note that lately she has been busier at work.  She was using nitroglycerin patches but now due to increased handwashing she has a difficult time keeping them adhered.  She continues to wear the brace at night.  She denies any numbness or tingling.  No swelling or erythema.  No skin changes   ROS:     See HPI. All other reviewed systems negative.  PERTINENT  PMH / PSH FH / / SH:  Past Medical, Surgical, Social, and Family History Reviewed & Updated in the EMR.    OBJECTIVE: BP 124/78   Ht 5' 3.5" (1.613 m)   Wt 200 lb (90.7 kg)   BMI 34.87 kg/m   Physical Exam:  Vital signs are reviewed.  GEN: Alert and oriented, NAD Pulm: Breathing unlabored PSY: normal mood, congruent affect  MSK: Right Wrist/Hand: Inspection: No obvious deformity. No swelling, erythema or bruising Palpation: Ulnar-sided tenderness over the ECU ROM: Full ROM of the digits and wrist Strength: 5/5 strength in the forearm, wrist, and interosseus muscles.  Mild pain with ulnar deviation and wrist extension Neurovascular: NV intact   MSK [place]: Ultrasound of the sixth extensor compartment shows tenosynovitis.  Thickening of the tendon capsule also noted.  No focal tears within the ECU tendon    ASSESSMENT & PLAN:  1.  Right wrist pain secondary to ECU tenosynovitis - Steroid injection performed today as described below  - Consider restarting bursitis - restart NG at night - OTC Arnica cream - wrist brace at night - Recommend making adjustments to work space to place the lower pad in front of the mouse as opposed to next to it. - f/u 6 weeks prn   Procedure performed: RIGHT ECU tendon injection, US guided Consent obtained and verified. Time-out conducted. Noted no overlying erythema, induration, or other signs of local infection. The right ECU tendon was identified with ultrasound.  The overlying skin was prepped in a sterile fashion. Topical analgesic spray: Ethyl chloride. Needle: 25-gauge, 5/8 inch Using an in plane approach the needle was advanced into the tendon sheath and medications were injected.  Meds: depomedrol 20mg , lidocaine 0.5cc  I observed and examined the patient with Dr Okey Dupre and agree with assessment and plan.  Note reviewed and modified by me.  Ila Mcgill, MD

## 2018-06-24 NOTE — Patient Instructions (Signed)
Your pain is from tendinitis of the extensor carpi ulnaris tendon.  Recommend making adjustments to work space to place the lower pad in front of the mouse as opposed to next week. Consider giving the Voltaren gel another try.  You may use it up to 4 times per day You may also try over-the-counter Arnica cream.  You can apply this several times per day as needed Restart the nitroglycerin patches at night Continue the wrist brace as needed at night We will see you back in about 6 weeks if needed.

## 2018-07-02 ENCOUNTER — Encounter

## 2018-08-15 ENCOUNTER — Other Ambulatory Visit: Payer: Self-pay | Admitting: Internal Medicine

## 2018-11-02 ENCOUNTER — Encounter: Payer: Self-pay | Admitting: Adult Health

## 2018-11-02 ENCOUNTER — Other Ambulatory Visit: Payer: Self-pay

## 2018-11-02 ENCOUNTER — Ambulatory Visit: Payer: PRIVATE HEALTH INSURANCE | Admitting: Adult Health

## 2018-11-02 ENCOUNTER — Ambulatory Visit (INDEPENDENT_AMBULATORY_CARE_PROVIDER_SITE_OTHER): Payer: PRIVATE HEALTH INSURANCE | Admitting: Adult Health

## 2018-11-02 DIAGNOSIS — F331 Major depressive disorder, recurrent, moderate: Secondary | ICD-10-CM

## 2018-11-02 DIAGNOSIS — F411 Generalized anxiety disorder: Secondary | ICD-10-CM | POA: Diagnosis not present

## 2018-11-02 DIAGNOSIS — G4700 Insomnia, unspecified: Secondary | ICD-10-CM

## 2018-11-02 DIAGNOSIS — N951 Menopausal and female climacteric states: Secondary | ICD-10-CM | POA: Insufficient documentation

## 2018-11-02 MED ORDER — ALPRAZOLAM 0.5 MG PO TABS: 0.5000 mg | ORAL_TABLET | Freq: Three times a day (TID) | ORAL | 2 refills | Status: DC | PRN

## 2018-11-02 MED ORDER — ESCITALOPRAM OXALATE 20 MG PO TABS: 20.0000 mg | ORAL_TABLET | Freq: Every day | ORAL | 1 refills | Status: DC

## 2018-11-02 NOTE — Progress Notes (Signed)
Kristin Rasmussen OX:[id number] 12-Oct-1960 58 y.o.  Subjective:   Patient ID:  Kristin Rasmussen is a 58 y.o. (DOB 02-Mar-1960) female.  Chief Complaint: No chief complaint on file.   HPI Kathleene Basilone presents to the office today for follow-up of   Describes mood today as "so-so". Pleasant. Mood symptoms - reports depression, anxiety, and irritability. Stating "I haven't been doing well". Having "problems". Started having issues in August. More "work related". [place] did a recent Network engineer". Her superior did not like the results and they have had "some words". Reports increased panic attacks and anxiety with all the "uproar". Feels like "it's caused a lot of rifts". Work setting now "very stressful". Mother needing to be placed in a nursing facility. Stating "i'm not ready to say goodbye to her. Son and his wife have decided to divorce - woried she may not see grandchild. Decreased interest and motivation. Taking medications as prescribed.  Energy levels Active, does not have a regular exercise routine. 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 Enjoys some usual interests and activities. Spending time with family - husband. 2 sons local. Appetite adequate. Weight stable. Sleeping difficulties. Averages 4 hours a night. Focus and concentration stable. Completing tasks. Managing aspects of household.  Denies SI or HI. Denies AH or VH.   Review of Systems:  Review of Systems  Musculoskeletal: Negative for gait problem.  Neurological: Negative for tremors.  Psychiatric/Behavioral: Positive for sleep disturbance. The patient is nervous/anxious.        Please refer to HPI    Medications: I have reviewed the patient's current medications.  Current Outpatient Medications  Medication Sig Dispense Refill  . ALPRAZolam (XANAX) 0.25 MG tablet Take 12.5 mg by mouth 2 (two) times daily.      . Ascorbic Acid (VITAMIN C) 500 MG CAPS Take by mouth.    . baclofen (LIORESAL) 10 MG tablet Take 1 tablet  (10 mg total) by mouth 3 (three) times daily. 30 each 5  . calcium citrate-vitamin D (CITRACAL+D) 315-200 MG-UNIT tablet Take 1 tablet by mouth 2 (two) times daily.    . cetirizine (ZYRTEC) 10 MG tablet Take 10 mg by mouth daily.    . diclofenac sodium (VOLTAREN) 1 % GEL Apply 4 g topically 4 (four) times daily. (Patient not taking: Reported on 03/11/2018) 100 g 5  . diphenhydrAMINE (BENADRYL) 25 mg capsule Take 25 mg by mouth every 6 (six) hours as needed.    . docusate sodium (COLACE) 100 MG capsule Take 100 mg by mouth daily as needed for mild constipation.    [name] escitalopram (LEXAPRO) 20 MG tablet Take 20 mg by mouth daily.    [name] esomeprazole (NEXIUM) 40 MG capsule TAKE 1 CAPSULE BY MOUTH ONCE DAILY 90 capsule 1  . estradiol (VIVELLE-DOT) 0.075 MG/24HR Apply 1 patch externally 2 times a week  5  . ketoconazole (NIZORAL) 2 % cream APPLY 1 APPLICATION ONCE DAILY FOR 30 DAYS    . magnesium hydroxide (MILK OF MAGNESIA) 400 MG/5ML suspension Take by mouth daily as needed for mild constipation.    . Multiple Vitamin (MULTIVITAMIN) tablet Take 1 tablet by mouth daily.      . nitroGLYCERIN (NITRODUR - DOSED IN MG/24 HR) 0.2 mg/hr patch Use 1/4 patch daily to the affected area. 30 patch 1  . oxybutynin (DITROPAN XL) 15 MG 24 hr tablet     . oxybutynin (DITROPAN-XL) 5 MG 24 hr tablet Take 5 mg by mouth at bedtime.    . pravastatin (PRAVACHOL)  20 MG tablet Take 1 tablet by mouth once daily 90 tablet 0   No current facility-administered medications for this visit.     Medication Side Effects: None  Allergies: No Known Allergies  Past Medical History:  Diagnosis Date  . Anemia 2004   HCT 34.7  . Anxiety    follows with psyc for same  . Back pain   . Basal cell cancer    x3; Dr Tonia Brooms  . Bladder leak   . Chest pain 06/2010   Costochondritis  . Diverticulosis   . Gallbladder problem   . GERD (gastroesophageal reflux disease)   . Hyperlipidemia   . IBS (irritable bowel syndrome)   . Melanoma  ([place]) 2006   x1  . PCOS (polycystic ovarian syndrome)   . Plantar fasciitis 08/13/2011   Left is significant and RT is mild by [place] criteria   . Spasm of esophagus   . Swelling   . Thrombocytopenia ([place]) 2002   133,000 platelets    Family History  Problem Relation Age of Onset  . Hypertension Father   . Colon polyps Father   . GER disease Father   . Skin cancer Father   . Ovarian cancer Paternal Aunt   . Diabetes Mother   . Hypertension Mother   . Obesity Mother   . Heart attack Maternal Grandmother 76  . Diabetes Maternal Grandmother   . Heart attack Maternal Grandfather 69  . Stroke Maternal Grandfather 23  . Colon cancer Paternal Grandfather        unsure age    Social History   Socioeconomic History  . Marital status: Married    Spouse name: Not on file  . Number of children: 3  . Years of education: 55  . Highest education level: Not on file  Occupational History  . 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 Financial resource strain: Not on file  . Food insecurity    Worry: Not on file    Inability: Not on file  . Transportation needs    Medical: Not on file    Non-medical: Not on file  Tobacco Use  . Smoking status: Never Smoker  . Smokeless tobacco: Never Used  Substance and Sexual Activity  . Alcohol use: Yes    Comment: rarely, only holiday's  . Drug use: No  . Sexual activity: Not on file  Lifestyle  . Physical activity    Days per week: Not on file    Minutes per session: Not on file  . Stress: Not on file  Relationships  . Social Herbalist on phone: Not on file    Gets together: Not on file    Attends religious service: Not on file    Active member of club or organization: Not on file    Attends meetings of clubs or organizations: Not on file    Relationship status: Not on file  . Intimate partner violence    Fear of current or ex partner: Not on file    Emotionally abused: Not on  file    Physically abused: Not on file    Forced sexual activity: Not on file  Other Topics Concern  . Not on file  Social History Narrative   Does some yard work and walking for exercise - couple of days a week    Past Medical History, Surgical history, Social history, and Family history were reviewed and updated as  appropriate.   Please see review of systems for further details on the patient's review from today.   Objective:   Physical Exam:  There were no vitals taken for this visit.  Physical Exam Constitutional:      General: She is not in acute distress.    Appearance: She is well-developed.  Musculoskeletal:        General: No deformity.  Neurological:     Mental Status: She is alert and oriented to person, place, and time.     Coordination: Coordination normal.  Psychiatric:        Attention and Perception: Attention and perception normal. She does not perceive auditory or visual hallucinations.        Mood and Affect: Mood is anxious and depressed. Affect is not labile, blunt, angry or inappropriate.        Speech: Speech normal.        Behavior: Behavior normal.        Thought Content: Thought content normal. Thought content is not paranoid or delusional. Thought content does not include homicidal or suicidal ideation. Thought content does not include homicidal or suicidal plan.        Cognition and Memory: Cognition and memory normal.        Judgment: Judgment normal.     Comments: Insight intact    Lab Review:     Component Value Date/Time   NA 139 12/07/2017 1003   NA 140 06/02/2017 1124   K 4.3 12/07/2017 1003   CL 102 12/07/2017 1003   CO2 30 12/07/2017 1003   GLUCOSE 98 12/07/2017 1003   BUN 9 12/07/2017 1003   BUN 13 06/02/2017 1124   CREATININE 0.55 12/07/2017 1003   CALCIUM 9.3 12/07/2017 1003   PROT 6.9 12/07/2017 1003   PROT 6.6 06/02/2017 1124   ALBUMIN 4.2 12/07/2017 1003   ALBUMIN 4.4 06/02/2017 1124   AST 13 12/07/2017 1003   ALT 14  12/07/2017 1003   ALKPHOS 40 12/07/2017 1003   BILITOT 0.4 12/07/2017 1003   BILITOT 0.2 06/02/2017 1124   GFRNONAA 109 06/02/2017 1124   GFRAA 125 06/02/2017 1124       Component Value Date/Time   WBC 7.2 12/07/2017 1003   RBC 5.35 (H) 12/07/2017 1003   HGB 16.1 (H) 12/07/2017 1003   HGB 14.6 06/03/2016 1253   HGB 14.2 03/29/2010 1506   HCT 47.4 (H) 12/07/2017 1003   HCT 43.3 06/03/2016 1253   HCT 41.3 03/29/2010 1506   PLT 242.0 12/07/2017 1003   PLT 221 03/29/2010 1506   MCV 88.5 12/07/2017 1003   MCV 88 06/03/2016 1253   MCV 88.4 03/29/2010 1506   MCH 29.6 06/03/2016 1253   MCH 29.8 06/28/2010 0355   MCHC 34.0 12/07/2017 1003   RDW 14.5 12/07/2017 1003   RDW 14.5 06/03/2016 1253   RDW 13.6 03/29/2010 1506   LYMPHSABS 1.5 12/07/2017 1003   LYMPHSABS 1.5 06/03/2016 1253   LYMPHSABS 1.6 03/29/2010 1506   MONOABS 0.4 12/07/2017 1003   MONOABS 0.4 03/29/2010 1506   EOSABS 0.2 12/07/2017 1003   EOSABS 0.2 06/03/2016 1253   BASOSABS 0.0 12/07/2017 1003   BASOSABS 0.0 06/03/2016 1253   BASOSABS 0.1 03/29/2010 1506    No results found for: POCLITH, LITHIUM   No results found for: PHENYTOIN, PHENOBARB, VALPROATE, CBMZ   .res Assessment: Plan:    Plan:  1. Lexapro 20mg  daily 2. Xanax 0.25mg  to 0.5mg  TID - temporary increase - 3 months while transitioning  into retirement.   RTC 4 weeks  Patient advised to contact office with any questions, adverse effects, or acute worsening in signs and symptoms.  Diagnoses and all orders for this visit:  Insomnia, unspecified type  Generalized anxiety disorder  Major depressive disorder, recurrent episode, moderate ([place])     Please see After Visit Summary for patient specific instructions.  Future Appointments  Date Time Provider [place]  12/09/2018  9:00 AM Binnie Rail, MD [facility]  12/15/2018  9:00 AM Erma Raiche, Berdie Ogren, NP [facility] None    No orders of the defined types were placed in this  encounter.   -------------------------------

## 2018-11-17 ENCOUNTER — Other Ambulatory Visit: Payer: Self-pay | Admitting: Internal Medicine

## 2018-11-23 LAB — HM MAMMOGRAPHY

## 2018-12-01 ENCOUNTER — Encounter: Payer: Self-pay | Admitting: Internal Medicine

## 2018-12-08 NOTE — Patient Instructions (Addendum)
Tests ordered today. Your results will be released to MyChart (or called to you) after review.  If any changes need to be made, you will be notified at that same time.  All other Health Maintenance issues reviewed.   All recommended immunizations and age-appropriate screenings are up-to-date or discussed.  No immunization administered today.   Medications reviewed and updated.  Changes include :   none   Please followup in 1 year   Health Maintenance, Female Adopting a healthy lifestyle and getting preventive care are important in promoting health and wellness. Ask your health care provider about:  The right schedule for you to have regular tests and exams.  Things you can do on your own to prevent diseases and keep yourself healthy. What should I know about diet, weight, and exercise? Eat a healthy diet   Eat a diet that includes plenty of vegetables, fruits, low-fat dairy products, and lean protein.  Do not eat a lot of foods that are high in solid fats, added sugars, or sodium. Maintain a healthy weight Body mass index (BMI) is used to identify weight problems. It estimates body fat based on height and weight. Your health care provider can help determine your BMI and help you achieve or maintain a healthy weight. Get regular exercise Get regular exercise. This is one of the most important things you can do for your health. Most adults should:  Exercise for at least 150 minutes each week. The exercise should increase your heart rate and make you sweat (moderate-intensity exercise).  Do strengthening exercises at least twice a week. This is in addition to the moderate-intensity exercise.  Spend less time sitting. Even light physical activity can be beneficial. Watch cholesterol and blood lipids Have your blood tested for lipids and cholesterol at 58 years of age, then have this test every 5 years. Have your cholesterol levels checked more often if:  Your lipid or cholesterol  levels are high.  You are older than 58 years of age.  You are at high risk for heart disease. What should I know about cancer screening? Depending on your health history and family history, you may need to have cancer screening at various ages. This may include screening for:  Breast cancer.  Cervical cancer.  Colorectal cancer.  Skin cancer.  Lung cancer. What should I know about heart disease, diabetes, and high blood pressure? Blood pressure and heart disease  High blood pressure causes heart disease and increases the risk of stroke. This is more likely to develop in people who have high blood pressure readings, are of African descent, or are overweight.  Have your blood pressure checked: ? Every 3-5 years if you are 18-39 years of age. ? Every year if you are 40 years old or older. Diabetes Have regular diabetes screenings. This checks your fasting blood sugar level. Have the screening done:  Once every three years after age 40 if you are at a normal weight and have a low risk for diabetes.  More often and at a younger age if you are overweight or have a high risk for diabetes. What should I know about preventing infection? Hepatitis B If you have a higher risk for hepatitis B, you should be screened for this virus. Talk with your health care provider to find out if you are at risk for hepatitis B infection. Hepatitis C Testing is recommended for:  Everyone born from 1945 through 1965.  Anyone with known risk factors for hepatitis C. Sexually transmitted   infections (STIs)  Get screened for STIs, including gonorrhea and chlamydia, if: ? You are sexually active and are younger than 58 years of age. ? You are older than 58 years of age and your health care provider tells you that you are at risk for this type of infection. ? Your sexual activity has changed since you were last screened, and you are at increased risk for chlamydia or gonorrhea. Ask your health care  provider if you are at risk.  Ask your health care provider about whether you are at high risk for HIV. Your health care provider may recommend a prescription medicine to help prevent HIV infection. If you choose to take medicine to prevent HIV, you should first get tested for HIV. You should then be tested every 3 months for as long as you are taking the medicine. Pregnancy  If you are about to stop having your period (premenopausal) and you may become pregnant, seek counseling before you get pregnant.  Take 400 to 800 micrograms (mcg) of folic acid every day if you become pregnant.  Ask for birth control (contraception) if you want to prevent pregnancy. Osteoporosis and menopause Osteoporosis is a disease in which the bones lose minerals and strength with aging. This can result in bone fractures. If you are 65 years old or older, or if you are at risk for osteoporosis and fractures, ask your health care provider if you should:  Be screened for bone loss.  Take a calcium or vitamin D supplement to lower your risk of fractures.  Be given hormone replacement therapy (HRT) to treat symptoms of menopause. Follow these instructions at home: Lifestyle  Do not use any products that contain nicotine or tobacco, such as cigarettes, e-cigarettes, and chewing tobacco. If you need help quitting, ask your health care provider.  Do not use street drugs.  Do not share needles.  Ask your health care provider for help if you need support or information about quitting drugs. Alcohol use  Do not drink alcohol if: ? Your health care provider tells you not to drink. ? You are pregnant, may be pregnant, or are planning to become pregnant.  If you drink alcohol: ? Limit how much you use to 0-1 drink a day. ? Limit intake if you are breastfeeding.  Be aware of how much alcohol is in your drink. In the [place] one drink equals one 12 oz bottle of beer (355 mL), one 5 oz glass of wine (148 mL), or one 1  oz glass of hard liquor (44 mL). General instructions  Schedule regular health, dental, and eye exams.  Stay current with your vaccines.  Tell your health care provider if: ? You often feel depressed. ? You have ever been abused or do not feel safe at home. Summary  Adopting a healthy lifestyle and getting preventive care are important in promoting health and wellness.  Follow your health care provider's instructions about healthy diet, exercising, and getting tested or screened for diseases.  Follow your health care provider's instructions on monitoring your cholesterol and blood pressure. This information is not intended to replace advice given to you by your health care provider. Make sure you discuss any questions you have with your health care provider. Document Released: 08/05/2010 Document Revised: 01/13/2018 Document Reviewed: 01/13/2018 Elsevier Patient Education  2020 [employer]  

## 2018-12-08 NOTE — Assessment & Plan Note (Addendum)
Will have dexa today Taking calcium and vitamin d daily Stressed regular exercise

## 2018-12-08 NOTE — Assessment & Plan Note (Addendum)
Sees derm annually 

## 2018-12-08 NOTE — Progress Notes (Signed)
Subjective:    Patient ID: Kristin Rasmussen, female    DOB: 1960/05/13, 58 y.o.   MRN: [id number]  HPI She is here for a physical exam.   She has had increased stress this year - work was stressed, but she just retired.   Her son and daughter-in-law area also separating.    Medications and allergies reviewed with patient and updated if appropriate.  Patient Active Problem List   Diagnosis Date Noted  . Menopausal symptom 11/02/2018  . Right wrist pain 12/07/2017  . Insulin resistance 10/08/2016  . Vitamin D deficiency 09/15/2016  . Obesity 09/15/2016  . Osteopenia 04/19/2015  . Low back pain syndrome 09/19/2014  . Left supraspinatus tendonitis 10/18/2013  . Anxiety   . Metatarsalgia 08/13/2011  . DIVERTICULOSIS, COLON 01/20/2008  . SKIN CANCER, HX OF 01/20/2008  . Melanoma of skin ([place]) 10/28/2006  . HYPERLIPIDEMIA 10/28/2006  . GERD 10/28/2006    Current Outpatient Medications on File Prior to Visit  Medication Sig Dispense Refill  . ALPRAZolam (XANAX) 0.25 MG tablet alprazolam 0.25 mg tablet  TAKE 1 TABLET BY MOUTH THREE TIMES DAILY AS NEEDED FOR ANXIETY    . ALPRAZolam (XANAX) 0.5 MG tablet Take 1 tablet (0.5 mg total) by mouth 3 (three) times daily as needed for anxiety. 90 tablet 2  . Ascorbic Acid (VITAMIN C) 500 MG CAPS Take by mouth.    . baclofen (LIORESAL) 10 MG tablet Take 1 tablet (10 mg total) by mouth 3 (three) times daily. 30 each 5  . calcium citrate-vitamin D (CITRACAL+D) 315-200 MG-UNIT tablet Take 1 tablet by mouth 2 (two) times daily.    . cetirizine (ZYRTEC) 10 MG tablet Take 10 mg by mouth daily.    . diphenhydrAMINE (BENADRYL) 25 mg capsule Take 25 mg by mouth every 6 (six) hours as needed.    . docusate sodium (COLACE) 100 MG capsule Take 100 mg by mouth daily as needed for mild constipation.    [name] escitalopram (LEXAPRO) 20 MG tablet Take 1 tablet (20 mg total) by mouth daily. 90 tablet 1  . esomeprazole (NEXIUM) 40 MG capsule TAKE 1 CAPSULE BY MOUTH  ONCE DAILY 90 capsule 1  . estradiol (VIVELLE-DOT) 0.075 MG/24HR Apply 1 patch externally 2 times a week  5  . ketoconazole (NIZORAL) 2 % cream APPLY 1 APPLICATION ONCE DAILY FOR 30 DAYS    . magnesium hydroxide (MILK OF MAGNESIA) 400 MG/5ML suspension Take by mouth daily as needed for mild constipation.    . Multiple Vitamin (MULTIVITAMIN) tablet Take 1 tablet by mouth daily.      . nitroGLYCERIN (NITRODUR - DOSED IN MG/24 HR) 0.2 mg/hr patch Use 1/4 patch daily to the affected area. 30 patch 1  . nystatin cream (MYCOSTATIN) APPLY CREAM TOPICALLY TO AFFECTED AREA TWICE DAILY    . oxybutynin (DITROPAN XL) 15 MG 24 hr tablet     . oxybutynin (DITROPAN-XL) 5 MG 24 hr tablet Take 5 mg by mouth at bedtime.    . pravastatin (PRAVACHOL) 20 MG tablet Take 1 tablet by mouth once daily 90 tablet 0  . diclofenac sodium (VOLTAREN) 1 % GEL Apply 4 g topically 4 (four) times daily. (Patient not taking: Reported on 03/11/2018) 100 g 5   No current facility-administered medications on file prior to visit.     Past Medical History:  Diagnosis Date  . Anemia 2004   HCT 34.7  . Anxiety    follows with psyc for same  . Back pain   .  Basal cell cancer    x3; Dr Tonia Brooms  . Bladder leak   . Chest pain 06/2010   Costochondritis  . Diverticulosis   . Gallbladder problem   . GERD (gastroesophageal reflux disease)   . Hyperlipidemia   . IBS (irritable bowel syndrome)   . Melanoma ([place]) 2006   x1  . PCOS (polycystic ovarian syndrome)   . Plantar fasciitis 08/13/2011   Left is significant and RT is mild by [place] criteria   . Spasm of esophagus   . Swelling   . Thrombocytopenia ([place]) 2002   133,000 platelets    Past Surgical History:  Procedure Laterality Date  . CHOLECYSTECTOMY  2002   [facility]  . COLONOSCOPY    . MELANOMA EXCISION     LUE 2006  [facility]; Derm : Dr Crista Luria  . SHOULDER SURGERY     Left  . TONSILLECTOMY    . TOTAL ABDOMINAL HYSTERECTOMY  1991   abnormal PAP hx    Social History    Socioeconomic History  . Marital status: Married    Spouse name: Not on file  . Number of children: 3  . Years of education: 48  . Highest education level: Not on file  Occupational History  . 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 Financial resource strain: Not on file  . Food insecurity    Worry: Not on file    Inability: Not on file  . Transportation needs    Medical: Not on file    Non-medical: Not on file  Tobacco Use  . Smoking status: Never Smoker  . Smokeless tobacco: Never Used  Substance and Sexual Activity  . Alcohol use: Yes    Comment: rarely, only holiday's  . Drug use: No  . Sexual activity: Not on file  Lifestyle  . Physical activity    Days per week: Not on file    Minutes per session: Not on file  . Stress: Not on file  Relationships  . Social Herbalist on phone: Not on file    Gets together: Not on file    Attends religious service: Not on file    Active member of club or organization: Not on file    Attends meetings of clubs or organizations: Not on file    Relationship status: Not on file  Other Topics Concern  . Not on file  Social History Narrative   Does some yard work and walking for exercise - couple of days a week    Family History  Problem Relation Age of Onset  . Hypertension Father   . Colon polyps Father   . GER disease Father   . Skin cancer Father   . Ovarian cancer Paternal Aunt   . Diabetes Mother   . Hypertension Mother   . Obesity Mother   . Heart attack Maternal Grandmother 76  . Diabetes Maternal Grandmother   . Heart attack Maternal Grandfather 69  . Stroke Maternal Grandfather 52  . Colon cancer Paternal Grandfather        unsure age    Review of Systems  Constitutional: Negative for chills and fever.  Eyes: Negative for visual disturbance.  Respiratory: Negative for cough, shortness of breath and wheezing.   Cardiovascular: Positive for palpitations  (with anxiety). Negative for chest pain and leg swelling.  Gastrointestinal: Positive for constipation (mild). Negative for abdominal pain, blood in stool, diarrhea and nausea.  Genitourinary:  Negative for dysuria and hematuria.  Musculoskeletal: Positive for back pain (chronic). Negative for arthralgias.  Skin: Negative for rash.  Neurological: Negative for light-headedness and headaches.  Psychiatric/Behavioral: Negative for dysphoric mood. The patient is nervous/anxious.        Objective:   Vitals:   12/09/18 0856  BP: 128/76  Pulse: 74  Temp: 97.9 F (36.6 C)  SpO2: 99%   Filed Weights   12/09/18 0856  Weight: 207 lb 12.8 oz (94.3 kg)   Body mass index is 36.23 kg/m.  BP Readings from Last 3 Encounters:  12/09/18 128/76  06/24/18 124/78  03/11/18 116/78    Wt Readings from Last 3 Encounters:  12/09/18 207 lb 12.8 oz (94.3 kg)  06/24/18 200 lb (90.7 kg)  03/11/18 200 lb (90.7 kg)     Physical Exam Constitutional: She appears well-developed and well-nourished. No distress.  HENT:  Head: Normocephalic and atraumatic.  Right Ear: External ear normal. Normal ear canal and TM Left Ear: External ear normal.  Normal ear canal and TM Mouth/Throat: Oropharynx is clear and moist.  Eyes: Conjunctivae and EOM are normal.  Neck: Neck supple. No tracheal deviation present. No thyromegaly present.  No carotid bruit  Cardiovascular: Normal rate, regular rhythm and normal heart sounds.   No murmur heard.  No edema. Pulmonary/Chest: Effort normal and breath sounds normal. No respiratory distress. She has no wheezes. She has no rales.  Breast: deferred   Abdominal: Soft. She exhibits no distension. There is no tenderness.  Lymphadenopathy: She has no cervical adenopathy.  Skin: Skin is warm and dry. She is not diaphoretic.  Psychiatric: She has a normal mood and affect. Her behavior is normal.        Assessment & Plan:   Physical exam: Screening blood work    ordered  Immunizations   All up to date Colonoscopy  Up to date  Mammogram  Up to date  Gyn  Up to date  Dexa  Done today  Eye exams  Due - will schedule Exercise  A little walking Weight  Encouraged weight loss Substance abuse   None Sees derm annually - up to date  See Problem List for Assessment and Plan of chronic medical problems.   FU in one year

## 2018-12-09 ENCOUNTER — Other Ambulatory Visit (INDEPENDENT_AMBULATORY_CARE_PROVIDER_SITE_OTHER): Payer: PRIVATE HEALTH INSURANCE

## 2018-12-09 ENCOUNTER — Ambulatory Visit (INDEPENDENT_AMBULATORY_CARE_PROVIDER_SITE_OTHER)
Admission: RE | Admit: 2018-12-09 | Discharge: 2018-12-09 | Disposition: A | Discharge status: Home / Self Care | Payer: PRIVATE HEALTH INSURANCE | Source: Ambulatory Visit | Attending: Internal Medicine | Admitting: Internal Medicine

## 2018-12-09 ENCOUNTER — Other Ambulatory Visit: Payer: Self-pay

## 2018-12-09 ENCOUNTER — Encounter: Payer: Self-pay | Admitting: Internal Medicine

## 2018-12-09 ENCOUNTER — Ambulatory Visit (INDEPENDENT_AMBULATORY_CARE_PROVIDER_SITE_OTHER): Payer: PRIVATE HEALTH INSURANCE | Admitting: Internal Medicine

## 2018-12-09 VITALS — BP 128/76 | HR 74 | Temp 97.9°F | Ht 63.5 in | Wt 207.8 lb

## 2018-12-09 DIAGNOSIS — Z6836 Body mass index (BMI) 36.0-36.9, adult: Secondary | ICD-10-CM

## 2018-12-09 DIAGNOSIS — E782 Mixed hyperlipidemia: Secondary | ICD-10-CM

## 2018-12-09 DIAGNOSIS — M85852 Other specified disorders of bone density and structure, left thigh: Secondary | ICD-10-CM

## 2018-12-09 DIAGNOSIS — E88819 Insulin resistance, unspecified: Secondary | ICD-10-CM

## 2018-12-09 DIAGNOSIS — Z0000 Encounter for general adult medical examination without abnormal findings: Secondary | ICD-10-CM

## 2018-12-09 DIAGNOSIS — N3281 Overactive bladder: Secondary | ICD-10-CM | POA: Insufficient documentation

## 2018-12-09 DIAGNOSIS — F419 Anxiety disorder, unspecified: Secondary | ICD-10-CM | POA: Diagnosis not present

## 2018-12-09 DIAGNOSIS — E8881 Metabolic syndrome: Secondary | ICD-10-CM

## 2018-12-09 DIAGNOSIS — C439 Malignant melanoma of skin, unspecified: Secondary | ICD-10-CM | POA: Diagnosis not present

## 2018-12-09 DIAGNOSIS — E669 Obesity, unspecified: Secondary | ICD-10-CM

## 2018-12-09 DIAGNOSIS — K219 Gastro-esophageal reflux disease without esophagitis: Secondary | ICD-10-CM

## 2018-12-09 LAB — COMPREHENSIVE METABOLIC PANEL
ALT: 16 U/L (ref 0–35)
AST: 14 U/L (ref 0–37)
Albumin: 4.2 g/dL (ref 3.5–5.2)
Alkaline Phosphatase: 44 U/L (ref 39–117)
BUN: 7 mg/dL (ref 6–23)
CO2: 30 mEq/L (ref 19–32)
Calcium: 8.9 mg/dL (ref 8.4–10.5)
Chloride: 102 mEq/L (ref 96–112)
Creatinine, Ser: 0.55 mg/dL (ref 0.40–1.20)
GFR: 113.43 mL/min (ref >60.00)
Glucose, Bld: 96 mg/dL (ref 70–99)
Potassium: 3.9 mEq/L (ref 3.5–5.1)
Sodium: 138 mEq/L (ref 135–145)
Total Bilirubin: 0.3 mg/dL (ref 0.2–1.2)
Total Protein: 6.7 g/dL (ref 6.0–8.3)

## 2018-12-09 LAB — LIPID PANEL
Cholesterol: 205 mg/dL — ABNORMAL HIGH (ref 0–200)
HDL: 39.8 mg/dL (ref >39.00)
LDL Cholesterol: 126 mg/dL — ABNORMAL HIGH (ref 0–99)
NonHDL: 165.12
Total CHOL/HDL Ratio: 5
Triglycerides: 197 mg/dL — ABNORMAL HIGH (ref 0.0–149.0)
VLDL: 39.4 mg/dL (ref 0.0–40.0)

## 2018-12-09 LAB — CBC WITH DIFFERENTIAL/PLATELET
Basophils Absolute: 0 10*3/uL (ref 0.0–0.1)
Basophils Relative: 0.6 % (ref 0.0–3.0)
Eosinophils Absolute: 0.2 10*3/uL (ref 0.0–0.7)
Eosinophils Relative: 2.5 % (ref 0.0–5.0)
HCT: 45.4 % (ref 36.0–46.0)
Hemoglobin: 15.3 g/dL — ABNORMAL HIGH (ref 12.0–15.0)
Lymphocytes Relative: 23.1 % (ref 12.0–46.0)
Lymphs Abs: 1.6 10*3/uL (ref 0.7–4.0)
MCHC: 33.7 g/dL (ref 30.0–36.0)
MCV: 87.9 fl (ref 78.0–100.0)
Monocytes Absolute: 0.4 10*3/uL (ref 0.1–1.0)
Monocytes Relative: 5.4 % (ref 3.0–12.0)
Neutro Abs: 4.7 10*3/uL (ref 1.4–7.7)
Neutrophils Relative %: 68.4 % (ref 43.0–77.0)
Platelets: 214 10*3/uL (ref 150.0–400.0)
RBC: 5.16 Mil/uL — ABNORMAL HIGH (ref 3.87–5.11)
RDW: 14.6 % (ref 11.5–15.5)
WBC: 6.8 10*3/uL (ref 4.0–10.5)

## 2018-12-09 LAB — TSH: TSH: 2.24 u[IU]/mL (ref 0.35–4.50)

## 2018-12-09 LAB — HEMOGLOBIN A1C: Hgb A1c MFr Bld: 5.6 % (ref 4.6–6.5)

## 2018-12-09 NOTE — Assessment & Plan Note (Signed)
occ GERD - likely from increased stress Overall controlled Continue nexium daily

## 2018-12-09 NOTE — Assessment & Plan Note (Signed)
Encouraged weight loss 

## 2018-12-09 NOTE — Assessment & Plan Note (Signed)
a1c

## 2018-12-09 NOTE — Assessment & Plan Note (Signed)
Following psychiatry  Management per above

## 2018-12-09 NOTE — Assessment & Plan Note (Signed)
Taking oxybutynin Managed by gyn

## 2018-12-09 NOTE — Assessment & Plan Note (Signed)
Check lipid panel,cmp ,tsh Continue daily statin Regular exercise and healthy diet encouraged  

## 2018-12-12 ENCOUNTER — Encounter: Payer: Self-pay | Admitting: Internal Medicine

## 2018-12-15 ENCOUNTER — Ambulatory Visit (INDEPENDENT_AMBULATORY_CARE_PROVIDER_SITE_OTHER): Payer: PRIVATE HEALTH INSURANCE | Admitting: Adult Health

## 2018-12-15 ENCOUNTER — Encounter: Payer: Self-pay | Admitting: Adult Health

## 2018-12-15 ENCOUNTER — Other Ambulatory Visit: Payer: Self-pay

## 2018-12-15 DIAGNOSIS — F411 Generalized anxiety disorder: Secondary | ICD-10-CM

## 2018-12-15 DIAGNOSIS — F331 Major depressive disorder, recurrent, moderate: Secondary | ICD-10-CM

## 2018-12-15 DIAGNOSIS — G4700 Insomnia, unspecified: Secondary | ICD-10-CM | POA: Diagnosis not present

## 2018-12-15 MED ORDER — ESCITALOPRAM OXALATE 20 MG PO TABS: 20.0000 mg | ORAL_TABLET | Freq: Every day | ORAL | 1 refills | Status: DC

## 2018-12-15 MED ORDER — ALPRAZOLAM 0.5 MG PO TABS: 0.5000 mg | ORAL_TABLET | Freq: Three times a day (TID) | ORAL | 2 refills | Status: DC | PRN

## 2018-12-15 NOTE — Progress Notes (Signed)
Kristin Rasmussen [id number] 06/03/1960 58 y.o.  Subjective:   Patient ID:  Kristin Rasmussen is a 58 y.o. (DOB 05/10/1960) female.  Chief Complaint: No chief complaint on file.   HPI Makynli Stills presents to the office today for follow-up of   Describes mood today as "ok". Pleasant. Mood symptoms - reports decreased depression, anxiety, and irritability. Stating "I decided to go ahead and retire". Taking one 0.'5mg'$  Xanax in the morning and one 0.25 at bedtime for sleep - "that seems to be working "pretty good". Feels like it was the "best" decision for her. Still having issues with son - going through a separation/divorce. Having to meet son's wife and pick up grandson for visitation. Mother doing "fair". Has dementia - family pitching in to help with her care. Improved interest and motivation. Taking medications as prescribed.  Energy levels stable. Active, does not have a regular exercise routine. Retired.  Enjoys some usual interests and activities. Married. Lives with husband. Spending time with family.  Appetite adequate. Weight stable. Sleep has improved. Averages 6 hours a night. Focus and concentration stable. Completing tasks. Managing aspects of household.  Denies SI or HI. Denies AH or VH.   Review of Systems:  Review of Systems  Musculoskeletal: Negative for gait problem.  Neurological: Negative for tremors.  Psychiatric/Behavioral: Negative for sleep disturbance. The patient is not nervous/anxious.        Please refer to HPI    Medications: I have reviewed the patient's current medications.  Current Outpatient Medications  Medication Sig Dispense Refill  . ALPRAZolam (XANAX) 0.5 MG tablet Take 1 tablet (0.5 mg total) by mouth 3 (three) times daily as needed for anxiety. 90 tablet 2  . Ascorbic Acid (VITAMIN C) 500 MG CAPS Take by mouth.    . baclofen (LIORESAL) 10 MG tablet Take 1 tablet (10 mg total) by mouth 3 (three) times daily. 30 each 5  . calcium citrate-vitamin D  (CITRACAL+D) 315-200 MG-UNIT tablet Take 1 tablet by mouth 2 (two) times daily.    . cetirizine (ZYRTEC) 10 MG tablet Take 10 mg by mouth daily.    . diphenhydrAMINE (BENADRYL) 25 mg capsule Take 25 mg by mouth every 6 (six) hours as needed.    . docusate sodium (COLACE) 100 MG capsule Take 100 mg by mouth daily as needed for mild constipation.    [name] escitalopram (LEXAPRO) 20 MG tablet Take 1 tablet (20 mg total) by mouth daily. 90 tablet 1  . esomeprazole (NEXIUM) 40 MG capsule TAKE 1 CAPSULE BY MOUTH ONCE DAILY 90 capsule 1  . estradiol (VIVELLE-DOT) 0.075 MG/24HR Apply 1 patch externally 2 times a week  5  . ketoconazole (NIZORAL) 2 % cream APPLY 1 APPLICATION ONCE DAILY FOR 30 DAYS    . Multiple Vitamin (MULTIVITAMIN) tablet Take 1 tablet by mouth daily.      [name] nystatin cream (MYCOSTATIN) APPLY CREAM TOPICALLY TO AFFECTED AREA TWICE DAILY    . oxybutynin (DITROPAN XL) 15 MG 24 hr tablet     . oxybutynin (DITROPAN-XL) 5 MG 24 hr tablet Take 5 mg by mouth at bedtime.    . pravastatin (PRAVACHOL) 20 MG tablet Take 1 tablet by mouth once daily 90 tablet 0  . Zoster Vaccine Adjuvanted (SHINGRIX) injection Shingrix (PF) 50 mcg/0.5 mL intramuscular suspension, kit  PHARMACIST ADMINISTERED IMMUNIZATION ADMINISTERED AT TIME OF DISPENSING     No current facility-administered medications for this visit.     Medication Side Effects: None  Allergies: No Known Allergies  Past Medical History:  Diagnosis Date  . Anemia 2004   HCT 34.7  . Anxiety    follows with psyc for same  . Back pain   . Basal cell cancer    x3; Dr Tonia Brooms  . Bladder leak   . Chest pain 06/2010   Costochondritis  . Diverticulosis   . Gallbladder problem   . GERD (gastroesophageal reflux disease)   . Hyperlipidemia   . IBS (irritable bowel syndrome)   . Melanoma ([place]) 2006   x1  . PCOS (polycystic ovarian syndrome)   . Plantar fasciitis 08/13/2011   Left is significant and RT is mild by [place] criteria   . Spasm of  esophagus   . Swelling   . Thrombocytopenia ([place]) 2002   133,000 platelets    Family History  Problem Relation Age of Onset  . Hypertension Father   . Colon polyps Father   . GER disease Father   . Skin cancer Father   . Ovarian cancer Paternal Aunt   . Diabetes Mother   . Hypertension Mother   . Obesity Mother   . Heart attack Maternal Grandmother 76  . Diabetes Maternal Grandmother   . Heart attack Maternal Grandfather 69  . Stroke Maternal Grandfather 53  . Colon cancer Paternal Grandfather        unsure age    Social History   Socioeconomic History  . Marital status: Married    Spouse name: Not on file  . Number of children: 3  . Years of education: 36  . Highest education level: Not on file  Occupational History  . 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 Financial resource strain: Not on file  . Food insecurity    Worry: Not on file    Inability: Not on file  . Transportation needs    Medical: Not on file    Non-medical: Not on file  Tobacco Use  . Smoking status: Never Smoker  . Smokeless tobacco: Never Used  Substance and Sexual Activity  . Alcohol use: Yes    Comment: rarely, only holiday's  . Drug use: No  . Sexual activity: Not on file  Lifestyle  . Physical activity    Days per week: Not on file    Minutes per session: Not on file  . Stress: Not on file  Relationships  . Social Herbalist on phone: Not on file    Gets together: Not on file    Attends religious service: Not on file    Active member of club or organization: Not on file    Attends meetings of clubs or organizations: Not on file    Relationship status: Not on file  . Intimate partner violence    Fear of current or ex partner: Not on file    Emotionally abused: Not on file    Physically abused: Not on file    Forced sexual activity: Not on file  Other Topics Concern  . Not on file  Social History Narrative   Does some  yard work and walking for exercise - couple of days a week    Past Medical History, Surgical history, Social history, and Family history were reviewed and updated as appropriate.   Please see review of systems for further details on the patient's review from today.   Objective:   Physical Exam:  There were no vitals taken for this visit.  Physical Exam  Constitutional:      General: She is not in acute distress.    Appearance: She is well-developed.  Musculoskeletal:        General: No deformity.  Neurological:     Mental Status: She is alert and oriented to person, place, and time.     Coordination: Coordination normal.  Psychiatric:        Attention and Perception: Attention and perception normal. She does not perceive auditory or visual hallucinations.        Mood and Affect: Affect is not labile, blunt, angry or inappropriate.        Speech: Speech normal.        Behavior: Behavior normal.        Thought Content: Thought content normal. Thought content is not paranoid or delusional. Thought content does not include homicidal or suicidal ideation. Thought content does not include homicidal or suicidal plan.        Cognition and Memory: Cognition and memory normal.        Judgment: Judgment normal.     Comments: Insight intact    Lab Review:     Component Value Date/Time   NA 138 12/09/2018 0935   NA 140 06/02/2017 1124   K 3.9 12/09/2018 0935   CL 102 12/09/2018 0935   CO2 30 12/09/2018 0935   GLUCOSE 96 12/09/2018 0935   BUN 7 12/09/2018 0935   BUN 13 06/02/2017 1124   CREATININE 0.55 12/09/2018 0935   CALCIUM 8.9 12/09/2018 0935   PROT 6.7 12/09/2018 0935   PROT 6.6 06/02/2017 1124   ALBUMIN 4.2 12/09/2018 0935   ALBUMIN 4.4 06/02/2017 1124   AST 14 12/09/2018 0935   ALT 16 12/09/2018 0935   ALKPHOS 44 12/09/2018 0935   BILITOT 0.3 12/09/2018 0935   BILITOT 0.2 06/02/2017 1124   GFRNONAA 109 06/02/2017 1124   GFRAA 125 06/02/2017 1124       Component  Value Date/Time   WBC 6.8 12/09/2018 0935   RBC 5.16 (H) 12/09/2018 0935   HGB 15.3 (H) 12/09/2018 0935   HGB 14.6 06/03/2016 1253   HGB 14.2 03/29/2010 1506   HCT 45.4 12/09/2018 0935   HCT 43.3 06/03/2016 1253   HCT 41.3 03/29/2010 1506   PLT 214.0 12/09/2018 0935   PLT 221 03/29/2010 1506   MCV 87.9 12/09/2018 0935   MCV 88 06/03/2016 1253   MCV 88.4 03/29/2010 1506   MCH 29.6 06/03/2016 1253   MCH 29.8 06/28/2010 0355   MCHC 33.7 12/09/2018 0935   RDW 14.6 12/09/2018 0935   RDW 14.5 06/03/2016 1253   RDW 13.6 03/29/2010 1506   LYMPHSABS 1.6 12/09/2018 0935   LYMPHSABS 1.5 06/03/2016 1253   LYMPHSABS 1.6 03/29/2010 1506   MONOABS 0.4 12/09/2018 0935   MONOABS 0.4 03/29/2010 1506   EOSABS 0.2 12/09/2018 0935   EOSABS 0.2 06/03/2016 1253   BASOSABS 0.0 12/09/2018 0935   BASOSABS 0.0 06/03/2016 1253   BASOSABS 0.1 03/29/2010 1506    No results found for: POCLITH, LITHIUM   No results found for: PHENYTOIN, PHENOBARB, VALPROATE, CBMZ   .res Assessment: Plan:    Plan:  1. Lexapro '20mg'$  daily 2. Xanax 0.'25mg'$  to 0.'5mg'$  TID - temporary increase - 3 months while transitioning into retirement.   RTC 6 months  Patient advised to contact office with any questions, adverse effects, or acute worsening in signs and symptoms.  Discussed potential benefits, risk, and side effects of benzodiazepines to include potential risk of tolerance and  dependence, as well as possible drowsiness. Advised patient not to drive if experiencing drowsiness and to take lowest possible effective dose to minimize risk of dependence and tolerance.  There are no diagnoses linked to this encounter.   Please see After Visit Summary for patient specific instructions.  Future Appointments  Date Time Provider [place]  12/12/2019  9:00 AM Burns, Claudina Lick, MD [id number]    No orders of the defined types were placed in this encounter.   -------------------------------

## 2019-01-13 ENCOUNTER — Other Ambulatory Visit: Payer: Self-pay | Admitting: Internal Medicine

## 2019-02-19 ENCOUNTER — Other Ambulatory Visit: Payer: Self-pay | Admitting: Internal Medicine

## 2019-03-28 ENCOUNTER — Other Ambulatory Visit: Payer: Self-pay | Admitting: Internal Medicine

## 2019-04-15 ENCOUNTER — Other Ambulatory Visit: Payer: Self-pay

## 2019-04-15 ENCOUNTER — Telehealth: Payer: Self-pay | Admitting: Adult Health

## 2019-04-15 DIAGNOSIS — F411 Generalized anxiety disorder: Secondary | ICD-10-CM

## 2019-04-15 DIAGNOSIS — G4700 Insomnia, unspecified: Secondary | ICD-10-CM

## 2019-04-15 NOTE — Telephone Encounter (Signed)
Pt needs refill on XANAX. Pharmacy told her to call because she wants to refill the [facility] .25 MG. Please sent to [facility].

## 2019-04-15 NOTE — Telephone Encounter (Signed)
She wants it this weekend I will not agree to #270.  She can have alprazolam 0.25 mg 3 times daily as needed #90 no refills.  Could you pend that for me?

## 2019-04-15 NOTE — Telephone Encounter (Signed)
RX pended Xanax 0.25 mg tid #90 no refills

## 2019-04-18 MED ORDER — ALPRAZOLAM 0.25 MG PO TABS: 0.2500 mg | ORAL_TABLET | Freq: Three times a day (TID) | ORAL | 0 refills | Status: DC | PRN

## 2019-05-26 ENCOUNTER — Other Ambulatory Visit: Payer: Self-pay | Admitting: Internal Medicine

## 2019-06-14 ENCOUNTER — Encounter: Payer: Self-pay | Admitting: Adult Health

## 2019-06-14 ENCOUNTER — Other Ambulatory Visit: Payer: Self-pay

## 2019-06-14 ENCOUNTER — Ambulatory Visit (INDEPENDENT_AMBULATORY_CARE_PROVIDER_SITE_OTHER): Payer: PRIVATE HEALTH INSURANCE | Admitting: Adult Health

## 2019-06-14 DIAGNOSIS — G4700 Insomnia, unspecified: Secondary | ICD-10-CM | POA: Diagnosis not present

## 2019-06-14 DIAGNOSIS — F411 Generalized anxiety disorder: Secondary | ICD-10-CM | POA: Diagnosis not present

## 2019-06-14 DIAGNOSIS — F331 Major depressive disorder, recurrent, moderate: Secondary | ICD-10-CM | POA: Diagnosis not present

## 2019-06-14 MED ORDER — ALPRAZOLAM 0.25 MG PO TABS: 0.2500 mg | ORAL_TABLET | Freq: Three times a day (TID) | ORAL | 2 refills | Status: DC | PRN

## 2019-06-14 MED ORDER — ESCITALOPRAM OXALATE 20 MG PO TABS: 20.0000 mg | ORAL_TABLET | Freq: Every day | ORAL | 1 refills | Status: DC

## 2019-06-14 NOTE — Progress Notes (Signed)
Kristin Rasmussen [id number] [birth year] 59 y.o.  Subjective:   Patient ID:  Kristin Rasmussen is a 59 y.o. (DOB 06/27/1960) female.  Chief Complaint: No chief complaint on file.   HPI Kristin Rasmussen presents to the office today for follow-up of MDD, GAD, and insomnia.  Describes mood today as "ok". Pleasant. Mood symptoms - reports decreased depression, anxiety, and irritability.   Stating "I'm getting there". Having "good and bad days". Is doing "much better" than she was. Things have gotten better with time. Is enjoying retirement - "I love it". Having issues with son - he and wife have separated with no plans to resolve differences. Sharing custody of son. Son living in their home - married 15 years. Seeing grandson - age 64 - taking him back and forth to school. Mother with dementia - now in a nursing home. Visiting regularly. Improved interest and motivation. Taking medications as prescribed.  Energy levels stable. Active, does not have a regular exercise routine. Retired.  Enjoys some usual interests and activities. Married. Lives with husband of 77 years. Spending time with family.  Appetite adequate. Weight gain. Sleeps well most nights. Averages 6 to 7 hours a night. Focus and concentration stable. Completing tasks. Managing aspects of household.  Denies SI or HI. Denies AH or VH.   PHQ2-9     Office Visit from 12/07/2017 in [place] Office Visit from 06/03/2016 in [place] Office Visit from 03/24/2016 in [place] Office Visit from 09/19/2014 in [place] Office Visit from 08/01/2014 in [place]  PHQ-2 Total Score  0  2  0  0  0  PHQ-9 Total Score  0  6  --  --  --       Review of Systems:  Review of Systems  Musculoskeletal: Negative for gait problem.  Neurological: Negative for tremors.  Psychiatric/Behavioral:       Please refer to HPI    Medications: I have  reviewed the patient's current medications.  Current Outpatient Medications  Medication Sig Dispense Refill  . ALPRAZolam (XANAX) 0.25 MG tablet Take 1 tablet (0.25 mg total) by mouth 3 (three) times daily as needed for anxiety. 90 tablet 2  . Ascorbic Acid (VITAMIN C) 500 MG CAPS Take by mouth.    . baclofen (LIORESAL) 10 MG tablet TAKE 1 TABLET BY MOUTH THREE TIMES DAILY 30 tablet 1  . calcium citrate-vitamin D (CITRACAL+D) 315-200 MG-UNIT tablet Take 1 tablet by mouth 2 (two) times daily.    . cetirizine (ZYRTEC) 10 MG tablet Take 10 mg by mouth daily.    . diphenhydrAMINE (BENADRYL) 25 mg capsule Take 25 mg by mouth every 6 (six) hours as needed.    . docusate sodium (COLACE) 100 MG capsule Take 100 mg by mouth daily as needed for mild constipation.    [name] escitalopram (LEXAPRO) 20 MG tablet Take 1 tablet (20 mg total) by mouth daily. 90 tablet 1  . esomeprazole (NEXIUM) 40 MG capsule Take 1 capsule by mouth once daily 90 capsule 1  . estradiol (VIVELLE-DOT) 0.075 MG/24HR Apply 1 patch externally 2 times a week  5  . ketoconazole (NIZORAL) 2 % cream APPLY 1 APPLICATION ONCE DAILY FOR 30 DAYS    . Multiple Vitamin (MULTIVITAMIN) tablet Take 1 tablet by mouth daily.      [name] nystatin cream (MYCOSTATIN) APPLY CREAM TOPICALLY TO AFFECTED AREA TWICE DAILY    . oxybutynin (DITROPAN  XL) 15 MG 24 hr tablet     . oxybutynin (DITROPAN-XL) 5 MG 24 hr tablet Take 5 mg by mouth at bedtime.    . pravastatin (PRAVACHOL) 20 MG tablet Take 1 tablet by mouth once daily 90 tablet 1  . Zoster Vaccine Adjuvanted (SHINGRIX) injection Shingrix (PF) 50 mcg/0.5 mL intramuscular suspension, kit  PHARMACIST ADMINISTERED IMMUNIZATION ADMINISTERED AT TIME OF DISPENSING     No current facility-administered medications for this visit.    Medication Side Effects: None  Allergies: No Known Allergies  Past Medical History:  Diagnosis Date  . Anemia 2004   HCT 34.7  . Anxiety    follows with psyc for same  . Back  pain   . Basal cell cancer    x3; Dr Tonia Brooms  . Bladder leak   . Chest pain 06/2010   Costochondritis  . Diverticulosis   . Gallbladder problem   . GERD (gastroesophageal reflux disease)   . Hyperlipidemia   . IBS (irritable bowel syndrome)   . Melanoma ([place]) 2006   x1  . PCOS (polycystic ovarian syndrome)   . Plantar fasciitis 08/13/2011   Left is significant and RT is mild by [place] criteria   . Spasm of esophagus   . Swelling   . Thrombocytopenia ([place]) 2002   133,000 platelets    Family History  Problem Relation Age of Onset  . Hypertension Father   . Colon polyps Father   . GER disease Father   . Skin cancer Father   . Ovarian cancer Paternal Aunt   . Diabetes Mother   . Hypertension Mother   . Obesity Mother   . Heart attack Maternal Grandmother 76  . Diabetes Maternal Grandmother   . Heart attack Maternal Grandfather 69  . Stroke Maternal Grandfather 64  . Colon cancer Paternal Grandfather        unsure age    Social History   Socioeconomic History  . Marital status: Married    Spouse name: Not on file  . Number of children: 3  . Years of education: 56  . Highest education level: Not on file  Occupational History  . 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 Smoking status: Never Smoker  . Smokeless tobacco: Never Used  Substance and Sexual Activity  . Alcohol use: Yes    Comment: rarely, only holiday's  . Drug use: No  . Sexual activity: Not on file  Other Topics Concern  . Not on file  Social History Narrative   Does some yard work and walking for exercise - couple of days a week   Social Determinants of Radio broadcast assistant Strain:   . Difficulty of Paying Living Expenses:   Food Insecurity:   . Worried About Charity fundraiser in the Last Year:   . Arboriculturist in the Last Year:   Transportation Needs:   . Film/video editor (Medical):   [name] Lack of Transportation (Non-Medical):    Physical Activity:   . Days of Exercise per Week:   . Minutes of Exercise per Session:   Stress:   . Feeling of Stress :   Social Connections:   . Frequency of Communication with Friends and Family:   . Frequency of Social Gatherings with Friends and Family:   . Attends Religious Services:   . Active Member of Clubs or Organizations:   . Attends Archivist Meetings:   .  Marital Status:   Intimate Partner Violence:   . Fear of Current or Ex-Partner:   . Emotionally Abused:   [name] Physically Abused:   . Sexually Abused:     Past Medical History, Surgical history, Social history, and Family history were reviewed and updated as appropriate.   Please see review of systems for further details on the patient's review from today.   Objective:   Physical Exam:  There were no vitals taken for this visit.  Physical Exam Constitutional:      General: She is not in acute distress. Musculoskeletal:        General: No deformity.  Neurological:     Mental Status: She is alert and oriented to person, place, and time.     Coordination: Coordination normal.  Psychiatric:        Attention and Perception: Attention and perception normal. She does not perceive auditory or visual hallucinations.        Mood and Affect: Mood normal. Mood is not anxious or depressed. Affect is not labile, blunt, angry or inappropriate.        Speech: Speech normal.        Behavior: Behavior normal.        Thought Content: Thought content normal. Thought content is not paranoid or delusional. Thought content does not include homicidal or suicidal ideation. Thought content does not include homicidal or suicidal plan.        Cognition and Memory: Cognition and memory normal.        Judgment: Judgment normal.     Comments: Insight intact     Lab Review:     Component Value Date/Time   NA 138 12/09/2018 0935   NA 140 06/02/2017 1124   K 3.9 12/09/2018 0935   CL 102 12/09/2018 0935   CO2 30  12/09/2018 0935   GLUCOSE 96 12/09/2018 0935   BUN 7 12/09/2018 0935   BUN 13 06/02/2017 1124   CREATININE 0.55 12/09/2018 0935   CALCIUM 8.9 12/09/2018 0935   PROT 6.7 12/09/2018 0935   PROT 6.6 06/02/2017 1124   ALBUMIN 4.2 12/09/2018 0935   ALBUMIN 4.4 06/02/2017 1124   AST 14 12/09/2018 0935   ALT 16 12/09/2018 0935   ALKPHOS 44 12/09/2018 0935   BILITOT 0.3 12/09/2018 0935   BILITOT 0.2 06/02/2017 1124   GFRNONAA 109 06/02/2017 1124   GFRAA 125 06/02/2017 1124       Component Value Date/Time   WBC 6.8 12/09/2018 0935   RBC 5.16 (H) 12/09/2018 0935   HGB 15.3 (H) 12/09/2018 0935   HGB 14.6 06/03/2016 1253   HGB 14.2 03/29/2010 1506   HCT 45.4 12/09/2018 0935   HCT 43.3 06/03/2016 1253   HCT 41.3 03/29/2010 1506   PLT 214.0 12/09/2018 0935   PLT 221 03/29/2010 1506   MCV 87.9 12/09/2018 0935   MCV 88 06/03/2016 1253   MCV 88.4 03/29/2010 1506   MCH 29.6 06/03/2016 1253   MCH 29.8 06/28/2010 0355   MCHC 33.7 12/09/2018 0935   RDW 14.6 12/09/2018 0935   RDW 14.5 06/03/2016 1253   RDW 13.6 03/29/2010 1506   LYMPHSABS 1.6 12/09/2018 0935   LYMPHSABS 1.5 06/03/2016 1253   LYMPHSABS 1.6 03/29/2010 1506   MONOABS 0.4 12/09/2018 0935   MONOABS 0.4 03/29/2010 1506   EOSABS 0.2 12/09/2018 0935   EOSABS 0.2 06/03/2016 1253   BASOSABS 0.0 12/09/2018 0935   BASOSABS 0.0 06/03/2016 1253   BASOSABS 0.1 03/29/2010 1506  No results found for: POCLITH, LITHIUM   No results found for: PHENYTOIN, PHENOBARB, VALPROATE, CBMZ   .res Assessment: Plan:    Plan:  1. Lexapro '20mg'$  daily 2. Decrease Xanax 0.'5mg'$  to 0.'25mg'$  TID  RTC 6 months  Patient advised to contact office with any questions, adverse effects, or acute worsening in signs and symptoms.  Discussed potential benefits, risk, and side effects of benzodiazepines to include potential risk of tolerance and dependence, as well as possible drowsiness. Advised patient not to drive if experiencing drowsiness and to  take lowest possible effective dose to minimize risk of dependence and tolerance.   Diagnoses and all orders for this visit:  Generalized anxiety disorder -     escitalopram (LEXAPRO) 20 MG tablet; Take 1 tablet (20 mg total) by mouth daily. -     ALPRAZolam (XANAX) 0.25 MG tablet; Take 1 tablet (0.25 mg total) by mouth 3 (three) times daily as needed for anxiety.  Major depressive disorder, recurrent episode, moderate (HCC) -     escitalopram (LEXAPRO) 20 MG tablet; Take 1 tablet (20 mg total) by mouth daily.  Insomnia, unspecified type -     ALPRAZolam (XANAX) 0.25 MG tablet; Take 1 tablet (0.25 mg total) by mouth 3 (three) times daily as needed for anxiety.     Please see After Visit Summary for patient specific instructions.  Future Appointments  Date Time Provider [place]  12/12/2019  9:00 AM Binnie Rail, MD [facility] None    No orders of the defined types were placed in this encounter.   -------------------------------

## 2019-08-28 ENCOUNTER — Other Ambulatory Visit: Payer: Self-pay | Admitting: Internal Medicine

## 2019-09-22 ENCOUNTER — Other Ambulatory Visit: Payer: Self-pay | Admitting: Internal Medicine

## 2019-11-07 ENCOUNTER — Other Ambulatory Visit: Payer: Self-pay | Admitting: Adult Health

## 2019-11-07 DIAGNOSIS — F411 Generalized anxiety disorder: Secondary | ICD-10-CM

## 2019-11-07 DIAGNOSIS — G4700 Insomnia, unspecified: Secondary | ICD-10-CM

## 2019-11-24 ENCOUNTER — Other Ambulatory Visit: Payer: Self-pay | Admitting: Internal Medicine

## 2019-12-06 LAB — HM MAMMOGRAPHY

## 2019-12-12 ENCOUNTER — Encounter: Payer: PRIVATE HEALTH INSURANCE | Admitting: Internal Medicine

## 2019-12-15 ENCOUNTER — Ambulatory Visit (INDEPENDENT_AMBULATORY_CARE_PROVIDER_SITE_OTHER): Payer: PRIVATE HEALTH INSURANCE | Admitting: Adult Health

## 2019-12-15 ENCOUNTER — Encounter: Payer: Self-pay | Admitting: Adult Health

## 2019-12-15 ENCOUNTER — Other Ambulatory Visit: Payer: Self-pay

## 2019-12-15 DIAGNOSIS — F411 Generalized anxiety disorder: Secondary | ICD-10-CM

## 2019-12-15 DIAGNOSIS — F331 Major depressive disorder, recurrent, moderate: Secondary | ICD-10-CM

## 2019-12-15 DIAGNOSIS — G4700 Insomnia, unspecified: Secondary | ICD-10-CM | POA: Diagnosis not present

## 2019-12-15 MED ORDER — ALPRAZOLAM 0.25 MG PO TABS: 0.2500 mg | ORAL_TABLET | Freq: Three times a day (TID) | ORAL | 2 refills | Status: DC | PRN

## 2019-12-15 MED ORDER — ESCITALOPRAM OXALATE 20 MG PO TABS: 20.0000 mg | ORAL_TABLET | Freq: Every day | ORAL | 3 refills | Status: DC

## 2019-12-15 NOTE — Progress Notes (Signed)
Kristin Rasmussen [id number] 1960-09-22 59 y.o.  Subjective:   Patient ID:  Kristin Rasmussen is a 59 y.o. (DOB 24-Dec-1960) female.  Chief Complaint: No chief complaint on file.   HPI Kristin Rasmussen presents to the office today for follow-up of MDD, GAD, and insomnia.  Describes mood today as "ok". Pleasant. Mood symptoms - reports decreased depression, anxiety, and irritability. Stating "overall I'm doing better". Still feels pulled in a lot of directions, but not as bad as it was. Concerned about younger son  - going through a divorce. Helping out with grandson on the weeks her son has him. Has started dating again. Recent vacation to [place] with older son and his family. Mother with dementia - in nursing home. Visiting regularly. Improved interest and motivation. Taking medications as prescribed.  Energy levels stable. Active, does not have a regular exercise routine.  Enjoys some usual interests and activities. Married. Lives with husband of 45 years. Spending time with family.  Appetite adequate. Weight gain. Sleeps well most nights. Averages 6 to 7 hours a night. Focus and concentration stable. Completing tasks. Managing aspects of household. Retied - both she and husband. Denies SI or HI. Denies AH or VH.    PHQ2-9     Office Visit from 12/07/2017 in [place] Office Visit from 06/03/2016 in [place] Office Visit from 03/24/2016 in [place] Office Visit from 09/19/2014 in [place] Office Visit from 08/01/2014 in [place]  PHQ-2 Total Score 0 2 0 0 0  PHQ-9 Total Score 0 6 -- -- --       Review of Systems:  Review of Systems  Musculoskeletal: Negative for gait problem.  Neurological: Negative for tremors.  Psychiatric/Behavioral:       Please refer to HPI    Medications: I have reviewed the patient's current medications.  Current Outpatient Medications   Medication Sig Dispense Refill  . ALPRAZolam (XANAX) 0.25 MG tablet Take 1 tablet (0.25 mg total) by mouth 3 (three) times daily as needed. for anxiety 90 tablet 2  . Ascorbic Acid (VITAMIN C) 500 MG CAPS Take by mouth.    . baclofen (LIORESAL) 10 MG tablet TAKE 1 TABLET BY MOUTH THREE TIMES DAILY 30 tablet 1  . calcium citrate-vitamin D (CITRACAL+D) 315-200 MG-UNIT tablet Take 1 tablet by mouth 2 (two) times daily.    . cetirizine (ZYRTEC) 10 MG tablet Take 10 mg by mouth daily.    . diphenhydrAMINE (BENADRYL) 25 mg capsule Take 25 mg by mouth every 6 (six) hours as needed.    . docusate sodium (COLACE) 100 MG capsule Take 100 mg by mouth daily as needed for mild constipation.    [name] escitalopram (LEXAPRO) 20 MG tablet Take 1 tablet (20 mg total) by mouth daily. 90 tablet 3  . esomeprazole (NEXIUM) 40 MG capsule Take 1 capsule by mouth once daily 90 capsule 0  . estradiol (VIVELLE-DOT) 0.075 MG/24HR Apply 1 patch externally 2 times a week  5  . ketoconazole (NIZORAL) 2 % cream APPLY 1 APPLICATION ONCE DAILY FOR 30 DAYS    . Multiple Vitamin (MULTIVITAMIN) tablet Take 1 tablet by mouth daily.      [name] nystatin cream (MYCOSTATIN) APPLY CREAM TOPICALLY TO AFFECTED AREA TWICE DAILY    . oxybutynin (DITROPAN XL) 15 MG 24 hr tablet     . oxybutynin (DITROPAN-XL) 5 MG 24 hr tablet Take 5 mg by mouth at bedtime.    [name]  pravastatin (PRAVACHOL) 20 MG tablet Take 1 tablet by mouth once daily 90 tablet 0  . Zoster Vaccine Adjuvanted (SHINGRIX) injection Shingrix (PF) 50 mcg/0.5 mL intramuscular suspension, kit  PHARMACIST ADMINISTERED IMMUNIZATION ADMINISTERED AT TIME OF DISPENSING     No current facility-administered medications for this visit.    Medication Side Effects: None  Allergies: No Known Allergies  Past Medical History:  Diagnosis Date  . Anemia 2004   HCT 34.7  . Anxiety    follows with psyc for same  . Back pain   . Basal cell cancer    x3; Dr Tonia Brooms  . Bladder leak   . Chest pain  06/2010   Costochondritis  . Diverticulosis   . Gallbladder problem   . GERD (gastroesophageal reflux disease)   . Hyperlipidemia   . IBS (irritable bowel syndrome)   . Melanoma ([place]) 2006   x1  . PCOS (polycystic ovarian syndrome)   . Plantar fasciitis 08/13/2011   Left is significant and RT is mild by [place] criteria   . Spasm of esophagus   . Swelling   . Thrombocytopenia ([place]) 2002   133,000 platelets    Family History  Problem Relation Age of Onset  . Hypertension Father   . Colon polyps Father   . GER disease Father   . Skin cancer Father   . Ovarian cancer Paternal Aunt   . Diabetes Mother   . Hypertension Mother   . Obesity Mother   . Heart attack Maternal Grandmother 76  . Diabetes Maternal Grandmother   . Heart attack Maternal Grandfather 69  . Stroke Maternal Grandfather 52  . Colon cancer Paternal Grandfather        unsure age    Social History   Socioeconomic History  . Marital status: Married    Spouse name: Not on file  . Number of children: 3  . Years of education: 35  . Highest education level: Not on file  Occupational History  . 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 Smoking status: Never Smoker  . Smokeless tobacco: Never Used  Substance and Sexual Activity  . Alcohol use: Yes    Comment: rarely, only holiday's  . Drug use: No  . Sexual activity: Not on file  Other Topics Concern  . Not on file  Social History Narrative   Does some yard work and walking for exercise - couple of days a week   Social Determinants of Radio broadcast assistant Strain:   . Difficulty of Paying Living Expenses: Not on file  Food Insecurity:   . Worried About Charity fundraiser in the Last Year: Not on file  . Ran Out of Food in the Last Year: Not on file  Transportation Needs:   . Lack of Transportation (Medical): Not on file  . Lack of Transportation (Non-Medical): Not on file  Physical Activity:   . Days  of Exercise per Week: Not on file  . Minutes of Exercise per Session: Not on file  Stress:   . Feeling of Stress : Not on file  Social Connections:   . Frequency of Communication with Friends and Family: Not on file  . Frequency of Social Gatherings with Friends and Family: Not on file  . Attends Religious Services: Not on file  . Active Member of Clubs or Organizations: Not on file  . Attends Archivist Meetings: Not on file  . Marital Status:  Not on file  Intimate Partner Violence:   . Fear of Current or Ex-Partner: Not on file  . Emotionally Abused: Not on file  . Physically Abused: Not on file  . Sexually Abused: Not on file    Past Medical History, Surgical history, Social history, and Family history were reviewed and updated as appropriate.   Please see review of systems for further details on the patient's review from today.   Objective:   Physical Exam:  There were no vitals taken for this visit.  Physical Exam Constitutional:      General: She is not in acute distress. Musculoskeletal:        General: No deformity.  Neurological:     Mental Status: She is alert and oriented to person, place, and time.     Coordination: Coordination normal.  Psychiatric:        Attention and Perception: Attention and perception normal. She does not perceive auditory or visual hallucinations.        Mood and Affect: Mood normal. Mood is not anxious or depressed. Affect is not labile, blunt, angry or inappropriate.        Speech: Speech normal.        Behavior: Behavior normal.        Thought Content: Thought content normal. Thought content is not paranoid or delusional. Thought content does not include homicidal or suicidal ideation. Thought content does not include homicidal or suicidal plan.        Cognition and Memory: Cognition and memory normal.        Judgment: Judgment normal.     Comments: Insight intact     Lab Review:     Component Value Date/Time   NA 138  12/09/2018 0935   NA 140 06/02/2017 1124   K 3.9 12/09/2018 0935   CL 102 12/09/2018 0935   CO2 30 12/09/2018 0935   GLUCOSE 96 12/09/2018 0935   BUN 7 12/09/2018 0935   BUN 13 06/02/2017 1124   CREATININE 0.55 12/09/2018 0935   CALCIUM 8.9 12/09/2018 0935   PROT 6.7 12/09/2018 0935   PROT 6.6 06/02/2017 1124   ALBUMIN 4.2 12/09/2018 0935   ALBUMIN 4.4 06/02/2017 1124   AST 14 12/09/2018 0935   ALT 16 12/09/2018 0935   ALKPHOS 44 12/09/2018 0935   BILITOT 0.3 12/09/2018 0935   BILITOT 0.2 06/02/2017 1124   GFRNONAA 109 06/02/2017 1124   GFRAA 125 06/02/2017 1124       Component Value Date/Time   WBC 6.8 12/09/2018 0935   RBC 5.16 (H) 12/09/2018 0935   HGB 15.3 (H) 12/09/2018 0935   HGB 14.6 06/03/2016 1253   HGB 14.2 03/29/2010 1506   HCT 45.4 12/09/2018 0935   HCT 43.3 06/03/2016 1253   HCT 41.3 03/29/2010 1506   PLT 214.0 12/09/2018 0935   PLT 221 03/29/2010 1506   MCV 87.9 12/09/2018 0935   MCV 88 06/03/2016 1253   MCV 88.4 03/29/2010 1506   MCH 29.6 06/03/2016 1253   MCH 29.8 06/28/2010 0355   MCHC 33.7 12/09/2018 0935   RDW 14.6 12/09/2018 0935   RDW 14.5 06/03/2016 1253   RDW 13.6 03/29/2010 1506   LYMPHSABS 1.6 12/09/2018 0935   LYMPHSABS 1.5 06/03/2016 1253   LYMPHSABS 1.6 03/29/2010 1506   MONOABS 0.4 12/09/2018 0935   MONOABS 0.4 03/29/2010 1506   EOSABS 0.2 12/09/2018 0935   EOSABS 0.2 06/03/2016 1253   BASOSABS 0.0 12/09/2018 0935   BASOSABS 0.0 06/03/2016 1253  BASOSABS 0.1 03/29/2010 1506    No results found for: POCLITH, LITHIUM   No results found for: PHENYTOIN, PHENOBARB, VALPROATE, CBMZ   .res Assessment: Plan:    Plan:  1. Lexapro 50m daily 2. Xanax 0.275mTID  RTC 6 months  Patient advised to contact office with any questions, adverse effects, or acute worsening in signs and symptoms.  Discussed potential benefits, risk, and side effects of benzodiazepines to include potential risk of tolerance and dependence, as well as  possible drowsiness. Advised patient not to drive if experiencing drowsiness and to take lowest possible effective dose to minimize risk of dependence and tolerance.    Diagnoses and all orders for this visit:  Generalized anxiety disorder -     escitalopram (LEXAPRO) 20 MG tablet; Take 1 tablet (20 mg total) by mouth daily. -     ALPRAZolam (XANAX) 0.25 MG tablet; Take 1 tablet (0.25 mg total) by mouth 3 (three) times daily as needed. for anxiety  Major depressive disorder, recurrent episode, moderate (HCC) -     escitalopram (LEXAPRO) 20 MG tablet; Take 1 tablet (20 mg total) by mouth daily.  Insomnia, unspecified type -     ALPRAZolam (XANAX) 0.25 MG tablet; Take 1 tablet (0.25 mg total) by mouth 3 (three) times daily as needed. for anxiety     Please see After Visit Summary for patient specific instructions.  Future Appointments  Date Time Provider [place]11/16/2021 10:00 AM BuBinnie RailMD [facility] None    No orders of the defined types were placed in this encounter.   -------------------------------

## 2019-12-18 NOTE — Progress Notes (Signed)
Subjective:    Patient ID: Kristin Rasmussen, female    DOB: [birth year], 59 y.o.   MRN: [id number]   This visit occurred during the SARS-CoV-2 public health emergency.  Safety protocols were in place, including screening questions prior to the visit, additional usage of staff PPE, and extensive cleaning of exam room while observing appropriate contact time as indicated for disinfecting solutions.    HPI She is here for a physical exam.   She had just retired when I saw her last year.  Her son is still going through a divorce.  She is seeing Dr Deloria Lair for depression and anxiety-the divorce has made that much worse.  She feels like her medication is helping.  She is still experiencing her chronic lower back pain.  She feels pain on a daily basis and sometimes it feels like it is in her hips and coming down her upper legs.    Medications and allergies reviewed with patient and updated if appropriate.  Patient Active Problem List   Diagnosis Date Noted  . Overactive bladder 12/09/2018  . Menopausal symptom 11/02/2018  . Right wrist pain 12/07/2017  . Insulin resistance 10/08/2016  . Vitamin D deficiency 09/15/2016  . Obesity 09/15/2016  . Osteopenia 04/19/2015  . Low back pain syndrome 09/19/2014  . Anxiety   . Metatarsalgia 08/13/2011  . DIVERTICULOSIS, COLON 01/20/2008  . SKIN CANCER, HX OF 01/20/2008  . History of melanoma in situ 10/28/2006  . HYPERLIPIDEMIA 10/28/2006  . GERD 10/28/2006    Current Outpatient Medications on File Prior to Visit  Medication Sig Dispense Refill  . ALPRAZolam (XANAX) 0.25 MG tablet Take 1 tablet (0.25 mg total) by mouth 3 (three) times daily as needed. for anxiety 90 tablet 2  . Ascorbic Acid (VITAMIN C) 500 MG CAPS Take by mouth.    . baclofen (LIORESAL) 10 MG tablet TAKE 1 TABLET BY MOUTH THREE TIMES DAILY 30 tablet 1  . calcium citrate-vitamin D (CITRACAL+D) 315-200 MG-UNIT tablet Take 1 tablet by mouth 2 (two) times daily.    .  cetirizine (ZYRTEC) 10 MG tablet Take 10 mg by mouth daily.    . Cholecalciferol (VITAMIN D3) 50 MCG (2000 UT) capsule Take 2,000 Units by mouth daily.    . diphenhydrAMINE (BENADRYL) 25 mg capsule Take 25 mg by mouth every 6 (six) hours as needed.    . docusate sodium (COLACE) 100 MG capsule Take 100 mg by mouth daily as needed for mild constipation.    . DOTTI 0.1 MG/24HR patch Place onto the skin.    [name] escitalopram (LEXAPRO) 20 MG tablet Take 1 tablet (20 mg total) by mouth daily. 90 tablet 3  . esomeprazole (NEXIUM) 40 MG capsule Take 1 capsule by mouth once daily 90 capsule 0  . estradiol (VIVELLE-DOT) 0.075 MG/24HR Apply 1 patch externally 2 times a week  5  . Multiple Vitamin (MULTIVITAMIN) tablet Take 1 tablet by mouth daily.      [name] nystatin cream (MYCOSTATIN) APPLY CREAM TOPICALLY TO AFFECTED AREA TWICE DAILY    . oxybutynin (DITROPAN XL) 15 MG 24 hr tablet     . pravastatin (PRAVACHOL) 20 MG tablet Take 1 tablet by mouth once daily 90 tablet 0  . oxybutynin (DITROPAN-XL) 5 MG 24 hr tablet Take 5 mg by mouth at bedtime. (Patient not taking: Reported on 12/20/2019)     No current facility-administered medications on file prior to visit.    Past Medical History:  Diagnosis Date  . Anemia  2004   HCT 34.7  . Anxiety    follows with psyc for same  . Back pain   . Basal cell cancer    x3; Dr Tonia Brooms  . Bladder leak   . Chest pain 06/2010   Costochondritis  . Diverticulosis   . Gallbladder problem   . GERD (gastroesophageal reflux disease)   . Hyperlipidemia   . IBS (irritable bowel syndrome)   . Melanoma ([place]) 2006   x1  . PCOS (polycystic ovarian syndrome)   . Plantar fasciitis 08/13/2011   Left is significant and RT is mild by [place] criteria   . Spasm of esophagus   . Swelling   . Thrombocytopenia ([place]) 2002   133,000 platelets    Past Surgical History:  Procedure Laterality Date  . CHOLECYSTECTOMY  2002   [facility]  . COLONOSCOPY    . MELANOMA EXCISION     LUE 2006  [facility];  Derm : Dr Crista Luria  . SHOULDER SURGERY     Left  . TONSILLECTOMY    . TOTAL ABDOMINAL HYSTERECTOMY  1991   abnormal PAP hx    Social History   Socioeconomic History  . Marital status: Married    Spouse name: Not on file  . Number of children: 3  . Years of education: 34  . Highest education level: Not on file  Occupational History  . 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 Smoking status: Never Smoker  . Smokeless tobacco: Never Used  Substance and Sexual Activity  . Alcohol use: Yes    Comment: rarely, only holiday's  . Drug use: No  . Sexual activity: Not on file  Other Topics Concern  . Not on file  Social History Narrative   Does some yard work and walking for exercise - couple of days a week   Social Determinants of Radio broadcast assistant Strain:   . Difficulty of Paying Living Expenses: Not on file  Food Insecurity:   . Worried About Charity fundraiser in the Last Year: Not on file  . Ran Out of Food in the Last Year: Not on file  Transportation Needs:   . Lack of Transportation (Medical): Not on file  . Lack of Transportation (Non-Medical): Not on file  Physical Activity:   . Days of Exercise per Week: Not on file  . Minutes of Exercise per Session: Not on file  Stress:   . Feeling of Stress : Not on file  Social Connections:   . Frequency of Communication with Friends and Family: Not on file  . Frequency of Social Gatherings with Friends and Family: Not on file  . Attends Religious Services: Not on file  . Active Member of Clubs or Organizations: Not on file  . Attends Archivist Meetings: Not on file  . Marital Status: Not on file    Family History  Problem Relation Age of Onset  . Hypertension Father   . Colon polyps Father   . GER disease Father   . Skin cancer Father   . Ovarian cancer Paternal Aunt   . Diabetes Mother   . Hypertension Mother   . Obesity Mother   . Heart attack  Maternal Grandmother 76  . Diabetes Maternal Grandmother   . Heart attack Maternal Grandfather 69  . Stroke Maternal Grandfather 60  . Colon cancer Paternal Grandfather        unsure age  Review of Systems  Constitutional: Negative for chills and fever.  Eyes: Negative for visual disturbance.  Respiratory: Negative for cough, shortness of breath and wheezing.   Cardiovascular: Positive for leg swelling (mild). Negative for chest pain and palpitations.  Gastrointestinal: Negative for abdominal pain, blood in stool, constipation, diarrhea and nausea.       No gerd  Genitourinary: Negative for dysuria and hematuria.  Musculoskeletal: Positive for back pain. Negative for arthralgias.  Skin: Negative for rash.  Neurological: Negative for light-headedness and headaches.  Psychiatric/Behavioral: Positive for dysphoric mood. The patient is nervous/anxious.        Objective:   Vitals:   12/20/19 1011  BP: 128/74  Pulse: 78  Temp: 98.2 F (36.8 C)  SpO2: 98%   Filed Weights   12/20/19 1011  Weight: 217 lb 3.2 oz (98.5 kg)   Body mass index is 37.87 kg/m.  BP Readings from Last 3 Encounters:  12/20/19 128/74  12/09/18 128/76  06/24/18 124/78    Wt Readings from Last 3 Encounters:  12/20/19 217 lb 3.2 oz (98.5 kg)  12/09/18 207 lb 12.8 oz (94.3 kg)  06/24/18 200 lb (90.7 kg)   Depression screen [facility] 2/9 12/20/2019 12/07/2017 12/07/2017 06/03/2016 03/24/2016  Decreased Interest 0 0 0 1 0  Down, Depressed, Hopeless 1 0 0 1 0  PHQ - 2 Score 1 0 0 2 0  Altered sleeping 0 0 - 1 -  Tired, decreased energy 0 0 - 1 -  Change in appetite 0 0 - 1 -  Feeling bad or failure about yourself  0 - - 1 -  Trouble concentrating 0 0 - 0 -  Moving slowly or fidgety/restless 0 0 - 0 -  Suicidal thoughts 0 0 - 0 -  PHQ-9 Score 1 0 - 6 -  Difficult doing work/chores Not difficult at all - - - -     Physical Exam Constitutional: She appears well-developed and well-nourished. No distress.   HENT:  Head: Normocephalic and atraumatic.  Right Ear: External ear normal. Normal ear canal and TM Left Ear: External ear normal.  Normal ear canal and TM Mouth/Throat: Oropharynx is clear and moist.  Eyes: Conjunctivae and EOM are normal.  Neck: Neck supple. No tracheal deviation present. No thyromegaly present.  No carotid bruit  Cardiovascular: Normal rate, regular rhythm and normal heart sounds.   No murmur heard.  No edema. Pulmonary/Chest: Effort normal and breath sounds normal. No respiratory distress. She has no wheezes. She has no rales.  Breast: deferred   Abdominal: Soft. She exhibits no distension. There is no tenderness.  Lymphadenopathy: She has no cervical adenopathy.  Skin: Skin is warm and dry. She is not diaphoretic.  Psychiatric: She has a normal mood and affect. Her behavior is normal.        Assessment & Plan:   Physical exam: Screening blood work    ordered Immunizations  tdap today, Had covid, flu vac today Colonoscopy  Up to date  Mammogram  Up to date  Gyn  Up to date - Dr Herbie Baltimore wein Dexa  Up to date  Eye exams  Up to date  Exercise  Yard work [employer]  Advised weight loss Substance abuse    none Sees derm annually   Screened for depression using the PHQ 9 scale.  No evidence of active depression.  She is currently on Lexapro, which is managed by psychiatry.  Anxiety is well controlled   See Problem List for Assessment and Plan  of chronic medical problems.

## 2019-12-18 NOTE — Patient Instructions (Addendum)
Blood work was ordered.    All other Health Maintenance issues reviewed.   All recommended immunizations and age-appropriate screenings are up-to-date or discussed.  Tetanus immunization administered today.   Medications reviewed and updated.  Changes include :   none    Please followup in 1 year    Health Maintenance, Female Adopting a healthy lifestyle and getting preventive care are important in promoting health and wellness. Ask your health care provider about:  The right schedule for you to have regular tests and exams.  Things you can do on your own to prevent diseases and keep yourself healthy. What should I know about diet, weight, and exercise? Eat a healthy diet   Eat a diet that includes plenty of vegetables, fruits, low-fat dairy products, and lean protein.  Do not eat a lot of foods that are high in solid fats, added sugars, or sodium. Maintain a healthy weight Body mass index (BMI) is used to identify weight problems. It estimates body fat based on height and weight. Your health care provider can help determine your BMI and help you achieve or maintain a healthy weight. Get regular exercise Get regular exercise. This is one of the most important things you can do for your health. Most adults should:  Exercise for at least 150 minutes each week. The exercise should increase your heart rate and make you sweat (moderate-intensity exercise).  Do strengthening exercises at least twice a week. This is in addition to the moderate-intensity exercise.  Spend less time sitting. Even light physical activity can be beneficial. Watch cholesterol and blood lipids Have your blood tested for lipids and cholesterol at 59 years of age, then have this test every 5 years. Have your cholesterol levels checked more often if:  Your lipid or cholesterol levels are high.  You are older than 59 years of age.  You are at high risk for heart disease. What should I know about cancer  screening? Depending on your health history and family history, you may need to have cancer screening at various ages. This may include screening for:  Breast cancer.  Cervical cancer.  Colorectal cancer.  Skin cancer.  Lung cancer. What should I know about heart disease, diabetes, and high blood pressure? Blood pressure and heart disease  High blood pressure causes heart disease and increases the risk of stroke. This is more likely to develop in people who have high blood pressure readings, are of African descent, or are overweight.  Have your blood pressure checked: ? Every 3-5 years if you are 16-71 years of age. ? Every year if you are 72 years old or older. Diabetes Have regular diabetes screenings. This checks your fasting blood sugar level. Have the screening done:  Once every three years after age 73 if you are at a normal weight and have a low risk for diabetes.  More often and at a younger age if you are overweight or have a high risk for diabetes. What should I know about preventing infection? Hepatitis B If you have a higher risk for hepatitis B, you should be screened for this virus. Talk with your health care provider to find out if you are at risk for hepatitis B infection. Hepatitis C Testing is recommended for:  Everyone born from [birth year] through 1965.  Anyone with known risk factors for hepatitis C. Sexually transmitted infections (STIs)  Get screened for STIs, including gonorrhea and chlamydia, if: ? You are sexually active and are younger than 59 years of  age. ? You are older than 59 years of age and your health care provider tells you that you are at risk for this type of infection. ? Your sexual activity has changed since you were last screened, and you are at increased risk for chlamydia or gonorrhea. Ask your health care provider if you are at risk.  Ask your health care provider about whether you are at high risk for HIV. Your health care provider may  recommend a prescription medicine to help prevent HIV infection. If you choose to take medicine to prevent HIV, you should first get tested for HIV. You should then be tested every 3 months for as long as you are taking the medicine. Pregnancy  If you are about to stop having your period (premenopausal) and you may become pregnant, seek counseling before you get pregnant.  Take 400 to 800 micrograms (mcg) of folic acid every day if you become pregnant.  Ask for birth control (contraception) if you want to prevent pregnancy. Osteoporosis and menopause Osteoporosis is a disease in which the bones lose minerals and strength with aging. This can result in bone fractures. If you are 7 years old or older, or if you are at risk for osteoporosis and fractures, ask your health care provider if you should:  Be screened for bone loss.  Take a calcium or vitamin D supplement to lower your risk of fractures.  Be given hormone replacement therapy (HRT) to treat symptoms of menopause. Follow these instructions at home: Lifestyle  Do not use any products that contain nicotine or tobacco, such as cigarettes, e-cigarettes, and chewing tobacco. If you need help quitting, ask your health care provider.  Do not use street drugs.  Do not share needles.  Ask your health care provider for help if you need support or information about quitting drugs. Alcohol use  Do not drink alcohol if: ? Your health care provider tells you not to drink. ? You are pregnant, may be pregnant, or are planning to become pregnant.  If you drink alcohol: ? Limit how much you use to 0-1 drink a day. ? Limit intake if you are breastfeeding.  Be aware of how much alcohol is in your drink. In the [place] one drink equals one 12 oz bottle of beer (355 mL), one 5 oz glass of wine (148 mL), or one 1 oz glass of hard liquor (44 mL). General instructions  Schedule regular health, dental, and eye exams.  Stay current with your  vaccines.  Tell your health care provider if: ? You often feel depressed. ? You have ever been abused or do not feel safe at home. Summary  Adopting a healthy lifestyle and getting preventive care are important in promoting health and wellness.  Follow your health care provider's instructions about healthy diet, exercising, and getting tested or screened for diseases.  Follow your health care provider's instructions on monitoring your cholesterol and blood pressure. This information is not intended to replace advice given to you by your health care provider. Make sure you discuss any questions you have with your health care provider. Document Revised: 01/13/2018 Document Reviewed: 01/13/2018 Elsevier Patient Education  2020 [employer].

## 2019-12-20 ENCOUNTER — Encounter: Payer: Self-pay | Admitting: Internal Medicine

## 2019-12-20 ENCOUNTER — Other Ambulatory Visit: Payer: Self-pay

## 2019-12-20 ENCOUNTER — Telehealth: Payer: Self-pay | Admitting: Internal Medicine

## 2019-12-20 ENCOUNTER — Ambulatory Visit (INDEPENDENT_AMBULATORY_CARE_PROVIDER_SITE_OTHER): Payer: PRIVATE HEALTH INSURANCE | Admitting: Internal Medicine

## 2019-12-20 VITALS — BP 128/74 | HR 78 | Temp 98.2°F | Ht 63.5 in | Wt 217.2 lb

## 2019-12-20 DIAGNOSIS — E782 Mixed hyperlipidemia: Secondary | ICD-10-CM

## 2019-12-20 DIAGNOSIS — Z86006 Personal history of melanoma in-situ: Secondary | ICD-10-CM

## 2019-12-20 DIAGNOSIS — E559 Vitamin D deficiency, unspecified: Secondary | ICD-10-CM | POA: Diagnosis not present

## 2019-12-20 DIAGNOSIS — E8881 Metabolic syndrome: Secondary | ICD-10-CM | POA: Diagnosis not present

## 2019-12-20 DIAGNOSIS — Z0000 Encounter for general adult medical examination without abnormal findings: Secondary | ICD-10-CM

## 2019-12-20 DIAGNOSIS — E669 Obesity, unspecified: Secondary | ICD-10-CM | POA: Insufficient documentation

## 2019-12-20 DIAGNOSIS — M545 Low back pain, unspecified: Secondary | ICD-10-CM

## 2019-12-20 DIAGNOSIS — Z23 Encounter for immunization: Secondary | ICD-10-CM

## 2019-12-20 DIAGNOSIS — F419 Anxiety disorder, unspecified: Secondary | ICD-10-CM

## 2019-12-20 DIAGNOSIS — Z6837 Body mass index (BMI) 37.0-37.9, adult: Secondary | ICD-10-CM

## 2019-12-20 DIAGNOSIS — G8929 Other chronic pain: Secondary | ICD-10-CM

## 2019-12-20 DIAGNOSIS — M85852 Other specified disorders of bone density and structure, left thigh: Secondary | ICD-10-CM

## 2019-12-20 DIAGNOSIS — K219 Gastro-esophageal reflux disease without esophagitis: Secondary | ICD-10-CM

## 2019-12-20 DIAGNOSIS — E88819 Insulin resistance, unspecified: Secondary | ICD-10-CM

## 2019-12-20 LAB — LIPID PANEL
Cholesterol: 187 mg/dL (ref 0–200)
HDL: 42.9 mg/dL (ref >39.00)
LDL Cholesterol: 104 mg/dL — ABNORMAL HIGH (ref 0–99)
NonHDL: 143.89
Total CHOL/HDL Ratio: 4
Triglycerides: 197 mg/dL — ABNORMAL HIGH (ref 0.0–149.0)
VLDL: 39.4 mg/dL (ref 0.0–40.0)

## 2019-12-20 LAB — CBC WITH DIFFERENTIAL/PLATELET
Basophils Absolute: 0.1 10*3/uL (ref 0.0–0.1)
Basophils Relative: 0.7 % (ref 0.0–3.0)
Eosinophils Absolute: 0.1 10*3/uL (ref 0.0–0.7)
Eosinophils Relative: 1.9 % (ref 0.0–5.0)
HCT: 44.8 % (ref 36.0–46.0)
Hemoglobin: 15.1 g/dL — ABNORMAL HIGH (ref 12.0–15.0)
Lymphocytes Relative: 22.2 % (ref 12.0–46.0)
Lymphs Abs: 1.7 10*3/uL (ref 0.7–4.0)
MCHC: 33.6 g/dL (ref 30.0–36.0)
MCV: 85.6 fl (ref 78.0–100.0)
Monocytes Absolute: 0.5 10*3/uL (ref 0.1–1.0)
Monocytes Relative: 6.1 % (ref 3.0–12.0)
Neutro Abs: 5.2 10*3/uL (ref 1.4–7.7)
Neutrophils Relative %: 69.1 % (ref 43.0–77.0)
Platelets: 191 10*3/uL (ref 150.0–400.0)
RBC: 5.24 Mil/uL — ABNORMAL HIGH (ref 3.87–5.11)
RDW: 14.6 % (ref 11.5–15.5)
WBC: 7.5 10*3/uL (ref 4.0–10.5)

## 2019-12-20 LAB — TSH: TSH: 1.83 u[IU]/mL (ref 0.35–4.50)

## 2019-12-20 LAB — COMPREHENSIVE METABOLIC PANEL
ALT: 19 U/L (ref 0–35)
AST: 17 U/L (ref 0–37)
Albumin: 4.1 g/dL (ref 3.5–5.2)
Alkaline Phosphatase: 45 U/L (ref 39–117)
BUN: 8 mg/dL (ref 6–23)
CO2: 32 mEq/L (ref 19–32)
Calcium: 9.2 mg/dL (ref 8.4–10.5)
Chloride: 100 mEq/L (ref 96–112)
Creatinine, Ser: 0.56 mg/dL (ref 0.40–1.20)
GFR: 100.01 mL/min (ref >60.00)
Glucose, Bld: 87 mg/dL (ref 70–99)
Potassium: 3.9 mEq/L (ref 3.5–5.1)
Sodium: 137 mEq/L (ref 135–145)
Total Bilirubin: 0.4 mg/dL (ref 0.2–1.2)
Total Protein: 6.8 g/dL (ref 6.0–8.3)

## 2019-12-20 LAB — VITAMIN D 25 HYDROXY (VIT D DEFICIENCY, FRACTURES): VITD: 33.11 ng/mL (ref 30.00–100.00)

## 2019-12-20 LAB — HEMOGLOBIN A1C: Hgb A1c MFr Bld: 5.7 % (ref 4.6–6.5)

## 2019-12-20 MED ORDER — ESOMEPRAZOLE MAGNESIUM 40 MG PO CPDR
40.0000 mg | DELAYED_RELEASE_CAPSULE | Freq: Every day | ORAL | 3 refills | Status: DC
Start: 2019-12-20 — End: 2020-12-17

## 2019-12-20 MED ORDER — PRAVASTATIN SODIUM 20 MG PO TABS
20.0000 mg | ORAL_TABLET | Freq: Every day | ORAL | 3 refills | Status: DC
Start: 2019-12-20 — End: 2020-03-01

## 2019-12-20 NOTE — Assessment & Plan Note (Signed)
Chronic Taking vitamin D daily Check vitamin D level  

## 2019-12-20 NOTE — Assessment & Plan Note (Signed)
Chronic Check lipid panel  Continue pravastatin 20 mg daily Regular exercise and healthy diet encouraged  

## 2019-12-20 NOTE — Assessment & Plan Note (Signed)
Chronic Managed by psychiatry She feels this is well controlled with her current medications

## 2019-12-20 NOTE — Telephone Encounter (Signed)
Patient called and was wondering if her referral could be changed to Dr. Marchia Bond at [facility].

## 2019-12-20 NOTE — Assessment & Plan Note (Signed)
Chronic With comorbidities of hyperlipidemia and GERD Stressed weight loss Advised regular exercise, healthy eating and decrease portions

## 2019-12-20 NOTE — Assessment & Plan Note (Signed)
Chronic Discussed the importance of weight loss

## 2019-12-20 NOTE — Assessment & Plan Note (Addendum)
Chronic Has some lower back pain daily, pain b/l hips and upper legs Would like to have this evaluated further-referred to orthopedics In past has done PT Taking baclofen as needed and it helps some-continue

## 2019-12-20 NOTE — Assessment & Plan Note (Signed)
Chronic GERD controlled Continue nexium 40 mg daily  

## 2019-12-20 NOTE — Assessment & Plan Note (Signed)
Chronic DEXA up-to-date Stressed regular exercise Taking calcium and vitamin D daily

## 2019-12-20 NOTE — Telephone Encounter (Signed)
Yes, this was changed

## 2019-12-20 NOTE — Assessment & Plan Note (Signed)
Sees dermatology annually-visits up-to-date

## 2019-12-20 NOTE — Assessment & Plan Note (Signed)
Chronic Check a1c Low sugar / carb diet Stressed regular exercise Encouraged weight loss 

## 2019-12-21 DIAGNOSIS — Z23 Encounter for immunization: Secondary | ICD-10-CM

## 2019-12-21 NOTE — Addendum Note (Signed)
Addended by: Marcina Millard on: 12/21/2019 03:23 PM   Modules accepted: Orders

## 2019-12-24 ENCOUNTER — Encounter: Payer: Self-pay | Admitting: Internal Medicine

## 2019-12-24 DIAGNOSIS — R7303 Prediabetes: Secondary | ICD-10-CM | POA: Insufficient documentation

## 2020-02-27 ENCOUNTER — Encounter: Payer: Self-pay | Admitting: Internal Medicine

## 2020-02-27 ENCOUNTER — Other Ambulatory Visit: Payer: Self-pay | Admitting: Internal Medicine

## 2020-02-27 ENCOUNTER — Telehealth (INDEPENDENT_AMBULATORY_CARE_PROVIDER_SITE_OTHER): Payer: PRIVATE HEALTH INSURANCE | Admitting: Internal Medicine

## 2020-02-27 DIAGNOSIS — J019 Acute sinusitis, unspecified: Secondary | ICD-10-CM | POA: Insufficient documentation

## 2020-02-27 MED ORDER — FLUCONAZOLE 150 MG PO TABS: 150.0000 mg | ORAL_TABLET | Freq: Once | ORAL | 0 refills | Status: AC

## 2020-02-27 MED ORDER — PROMETHAZINE-DM 6.25-15 MG/5ML PO SYRP: 5.0000 mL | ORAL_SOLUTION | Freq: Four times a day (QID) | ORAL | 0 refills | Status: DC | PRN

## 2020-02-27 MED ORDER — AMOXICILLIN-POT CLAVULANATE 875-125 MG PO TABS: 1.0000 | ORAL_TABLET | Freq: Two times a day (BID) | ORAL | 0 refills | Status: DC

## 2020-02-27 NOTE — Progress Notes (Signed)
Virtual Visit via Video Note  I connected with Kristin Rasmussen on 02/27/20 at  2:15 PM EST by a video enabled telemedicine application and verified that I am speaking with the correct person using two identifiers.   I discussed the limitations of evaluation and management by telemedicine and the availability of in person appointments. The patient expressed understanding and agreed to proceed.  Present for the visit:  Myself, Dr Billey Gosling, Kristin Rasmussen.  The patient is currently at home and I am in the office.    No referring provider.    History of Present Illness: She is here for an acute visit for cold symptoms.   Her symptoms started one week ago.  Her husband is also sick and his symptoms started prior. He is now better.  He had symptoms for at least 2 weeks. He had two rapid tests which were negative at the [facility].    She is experiencing nasal congestion, PND, sinus pressure and ear pain.  She has a cough that is worse at night. She has a ST from drainage, dizziness and nausea.   She has tried taking several otc medications with some improvement.    She had 3 covid vaccines.    Review of Systems  Constitutional: Negative for fever.  HENT: Positive for congestion (discolored), ear pain, sinus pain and sore throat (from drainage).        PND  Respiratory: Positive for cough and wheezing (a little). Negative for shortness of breath.   Gastrointestinal: Positive for nausea. Negative for diarrhea.  Neurological: Positive for headaches. Negative for dizziness.      Social History   Socioeconomic History  . Marital status: Married    Spouse name: Not on file  . Number of children: 3  . Years of education: 21  . Highest education level: Not on file  Occupational History  . 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 Smoking status: Never Smoker  . Smokeless tobacco: Never Used  Substance and Sexual Activity  . Alcohol use: Yes     Comment: rarely, only holiday's  . Drug use: No  . Sexual activity: Not on file  Other Topics Concern  . Not on file  Social History Narrative   Does some yard work and walking for exercise - couple of days a week   Social Determinants of Radio broadcast assistant Strain: Not on file  Food Insecurity: Not on file  Transportation Needs: Not on file  Physical Activity: Not on file  Stress: Not on file  Social Connections: Not on file     Observations/Objective: Appears well in NAD Intermittent cough  Breathing normally  Assessment and Plan:  See Problem List for Assessment and Plan of chronic medical problems.   Follow Up Instructions:    I discussed the assessment and treatment plan with the patient. The patient was provided an opportunity to ask questions and all were answered. The patient agreed with the plan and demonstrated an understanding of the instructions.   The patient was advised to call back or seek an in-person evaluation if the symptoms worsen or if the condition fails to improve as anticipated.    Binnie Rail, MD

## 2020-02-27 NOTE — Assessment & Plan Note (Signed)
Acute Likely bacterial  Start Augmentin 875-125 mg BID x 10 day otc cold medications Rest, fluid Call if no improvement  

## 2020-02-29 ENCOUNTER — Other Ambulatory Visit: Payer: Self-pay | Admitting: Internal Medicine

## 2020-03-01 ENCOUNTER — Other Ambulatory Visit: Payer: Self-pay | Admitting: Internal Medicine

## 2020-03-02 ENCOUNTER — Encounter: Payer: Self-pay | Admitting: Internal Medicine

## 2020-03-02 NOTE — Progress Notes (Signed)
Outside notes received. Information abstracted. Notes sent to scan.  

## 2020-04-12 ENCOUNTER — Other Ambulatory Visit: Payer: Self-pay | Admitting: Adult Health

## 2020-04-12 DIAGNOSIS — G4700 Insomnia, unspecified: Secondary | ICD-10-CM

## 2020-04-12 DIAGNOSIS — F411 Generalized anxiety disorder: Secondary | ICD-10-CM

## 2020-05-03 ENCOUNTER — Other Ambulatory Visit: Payer: Self-pay | Admitting: Internal Medicine

## 2020-05-24 ENCOUNTER — Other Ambulatory Visit: Payer: Self-pay | Admitting: Adult Health

## 2020-05-24 DIAGNOSIS — F411 Generalized anxiety disorder: Secondary | ICD-10-CM

## 2020-05-24 DIAGNOSIS — G4700 Insomnia, unspecified: Secondary | ICD-10-CM

## 2020-05-24 NOTE — Telephone Encounter (Signed)
Last filled 04/12/20 no refills.appt 06/13/20

## 2020-05-26 ENCOUNTER — Other Ambulatory Visit: Payer: Self-pay | Admitting: Internal Medicine

## 2020-06-13 ENCOUNTER — Ambulatory Visit: Payer: PRIVATE HEALTH INSURANCE | Admitting: Adult Health

## 2020-07-05 ENCOUNTER — Encounter: Payer: Self-pay | Admitting: Adult Health

## 2020-07-05 ENCOUNTER — Ambulatory Visit (INDEPENDENT_AMBULATORY_CARE_PROVIDER_SITE_OTHER): Payer: PRIVATE HEALTH INSURANCE | Admitting: Adult Health

## 2020-07-05 ENCOUNTER — Other Ambulatory Visit: Payer: Self-pay

## 2020-07-05 DIAGNOSIS — F331 Major depressive disorder, recurrent, moderate: Secondary | ICD-10-CM

## 2020-07-05 DIAGNOSIS — G4700 Insomnia, unspecified: Secondary | ICD-10-CM | POA: Diagnosis not present

## 2020-07-05 DIAGNOSIS — F411 Generalized anxiety disorder: Secondary | ICD-10-CM | POA: Diagnosis not present

## 2020-07-05 MED ORDER — ESCITALOPRAM OXALATE 20 MG PO TABS: 20.0000 mg | ORAL_TABLET | Freq: Every day | ORAL | 3 refills | Status: DC

## 2020-07-05 MED ORDER — ALPRAZOLAM 0.25 MG PO TABS: 0.2500 mg | ORAL_TABLET | Freq: Three times a day (TID) | ORAL | 2 refills | Status: DC | PRN

## 2020-07-05 NOTE — Progress Notes (Signed)
Kristin Rasmussen [id number] 1960-12-20 60 y.o.  Subjective:   Patient ID:  Kristin Rasmussen is a 60 y.o. (DOB [birth year]) female.  Chief Complaint: No chief complaint on file.   HPI Kristin Rasmussen presents to the office today for follow-up of MDD, GAD, and insomnia.  Describes mood today as "ok". Pleasant. Mood symptoms - reports decreased depression, anxiety, and irritability. Stating "I'm doing alright". Mother, age 87 recently passed away. Younger son recently divorced - "doing better". Has met someone and plans to remarry. Will be keeping her 84 year old grandson every other week over the summer. Improved interest and motivation. Taking medications as prescribed.  Energy levels stable. Active, does not have a regular exercise routine. Working in the yard. Enjoys some usual interests and activities. Married. Lives with husband. Spending time with family.  Appetite adequate. Weight gain. Sleeps well most nights. Averages 6 to 7 hours a night. Focus and concentration stable. Completing tasks. anaging aspects of household. Retired - both she and husband. Denies SI or HI.  Denies AH or VH.     [place] Office Visit from 12/20/2019 in [place] at [facility] Visit from 12/07/2017 in [place] Office Visit from 06/03/2016 in [place] Office Visit from 03/24/2016 in [place] Office Visit from 09/19/2014 in [place]  PHQ-2 Total Score 1 0 2 0 0  PHQ-9 Total Score 1 0 6 -- --       Review of Systems:  Review of Systems  Musculoskeletal: Negative for gait problem.  Neurological: Negative for tremors.  Psychiatric/Behavioral:       Please refer to HPI    Medications: I have reviewed the patient's current medications.  Current Outpatient Medications  Medication Sig Dispense Refill  . ALPRAZolam (XANAX) 0.25 MG tablet Take 1 tablet (0.25 mg total) by mouth 3 (three)  times daily as needed. for anxiety 90 tablet 2  . amoxicillin-clavulanate (AUGMENTIN) 875-125 MG tablet Take 1 tablet by mouth 2 (two) times daily. 20 tablet 0  . Ascorbic Acid (VITAMIN C) 500 MG CAPS Take by mouth.    . baclofen (LIORESAL) 10 MG tablet TAKE 1 TABLET BY MOUTH THREE TIMES DAILY 30 tablet 0  . calcium citrate-vitamin D (CITRACAL+D) 315-200 MG-UNIT tablet Take 1 tablet by mouth 2 (two) times daily.    . cetirizine (ZYRTEC) 10 MG tablet Take 10 mg by mouth daily.    . Cholecalciferol (VITAMIN D3) 50 MCG (2000 UT) capsule Take 2,000 Units by mouth daily.    . diphenhydrAMINE (BENADRYL) 25 mg capsule Take 25 mg by mouth every 6 (six) hours as needed.    . docusate sodium (COLACE) 100 MG capsule Take 100 mg by mouth daily as needed for mild constipation.    . DOTTI 0.1 MG/24HR patch Place onto the skin.    [name] escitalopram (LEXAPRO) 20 MG tablet Take 1 tablet (20 mg total) by mouth daily. 90 tablet 3  . esomeprazole (NEXIUM) 40 MG capsule Take 1 capsule (40 mg total) by mouth daily. 90 capsule 3  . estradiol (VIVELLE-DOT) 0.075 MG/24HR Apply 1 patch externally 2 times a week  5  . Multiple Vitamin (MULTIVITAMIN) tablet Take 1 tablet by mouth daily.      [name] nystatin cream (MYCOSTATIN) APPLY CREAM TOPICALLY TO AFFECTED AREA TWICE DAILY    . oxybutynin (DITROPAN XL) 15 MG 24 hr tablet     . pravastatin (PRAVACHOL) 20 MG  tablet Take 1 tablet by mouth once daily 90 tablet 2  . promethazine-dextromethorphan (PROMETHAZINE-DM) 6.25-15 MG/5ML syrup Take 5 mLs by mouth 4 (four) times daily as needed for cough. 150 mL 0   No current facility-administered medications for this visit.    Medication Side Effects: None  Allergies: No Known Allergies  Past Medical History:  Diagnosis Date  . Anemia 2004   HCT 34.7  . Anxiety    follows with psyc for same  . Back pain   . Basal cell cancer    x3; Dr Tonia Brooms  . Bladder leak   . Chest pain 06/2010   Costochondritis  . Diverticulosis   .  Gallbladder problem   . GERD (gastroesophageal reflux disease)   . Hyperlipidemia   . IBS (irritable bowel syndrome)   . Melanoma ([place]) 2006   x1  . PCOS (polycystic ovarian syndrome)   . Plantar fasciitis 08/13/2011   Left is significant and RT is mild by [place] criteria   . Spasm of esophagus   . Swelling   . Thrombocytopenia ([place]) 2002   133,000 platelets    Past Medical History, Surgical history, Social history, and Family history were reviewed and updated as appropriate.   Please see review of systems for further details on the patient's review from today.   Objective:   Physical Exam:  There were no vitals taken for this visit.  Physical Exam Constitutional:      General: She is not in acute distress. Musculoskeletal:        General: No deformity.  Neurological:     Mental Status: She is alert and oriented to person, place, and time.     Coordination: Coordination normal.  Psychiatric:        Attention and Perception: Attention and perception normal. She does not perceive auditory or visual hallucinations.        Mood and Affect: Mood normal. Mood is not anxious or depressed. Affect is not labile, blunt, angry or inappropriate.        Speech: Speech normal.        Behavior: Behavior normal.        Thought Content: Thought content normal. Thought content is not paranoid or delusional. Thought content does not include homicidal or suicidal ideation. Thought content does not include homicidal or suicidal plan.        Cognition and Memory: Cognition and memory normal.        Judgment: Judgment normal.     Comments: Insight intact     Lab Review:     Component Value Date/Time   NA 137 12/20/2019 1101   NA 140 06/02/2017 1124   K 3.9 12/20/2019 1101   CL 100 12/20/2019 1101   CO2 32 12/20/2019 1101   GLUCOSE 87 12/20/2019 1101   BUN 8 12/20/2019 1101   BUN 13 06/02/2017 1124   CREATININE 0.56 12/20/2019 1101   CALCIUM 9.2 12/20/2019 1101   PROT 6.8 12/20/2019 1101    PROT 6.6 06/02/2017 1124   ALBUMIN 4.1 12/20/2019 1101   ALBUMIN 4.4 06/02/2017 1124   AST 17 12/20/2019 1101   ALT 19 12/20/2019 1101   ALKPHOS 45 12/20/2019 1101   BILITOT 0.4 12/20/2019 1101   BILITOT 0.2 06/02/2017 1124   GFRNONAA 109 06/02/2017 1124   GFRAA 125 06/02/2017 1124       Component Value Date/Time   WBC 7.5 12/20/2019 1101   RBC 5.24 (H) 12/20/2019 1101   HGB 15.1 (H) 12/20/2019 1101  HGB 14.6 06/03/2016 1253   HGB 14.2 03/29/2010 1506   HCT 44.8 12/20/2019 1101   HCT 43.3 06/03/2016 1253   HCT 41.3 03/29/2010 1506   PLT 191.0 12/20/2019 1101   PLT 221 03/29/2010 1506   MCV 85.6 12/20/2019 1101   MCV 88 06/03/2016 1253   MCV 88.4 03/29/2010 1506   MCH 29.6 06/03/2016 1253   MCH 29.8 06/28/2010 0355   MCHC 33.6 12/20/2019 1101   RDW 14.6 12/20/2019 1101   RDW 14.5 06/03/2016 1253   RDW 13.6 03/29/2010 1506   LYMPHSABS 1.7 12/20/2019 1101   LYMPHSABS 1.5 06/03/2016 1253   LYMPHSABS 1.6 03/29/2010 1506   MONOABS 0.5 12/20/2019 1101   MONOABS 0.4 03/29/2010 1506   EOSABS 0.1 12/20/2019 1101   EOSABS 0.2 06/03/2016 1253   BASOSABS 0.1 12/20/2019 1101   BASOSABS 0.0 06/03/2016 1253   BASOSABS 0.1 03/29/2010 1506    No results found for: POCLITH, LITHIUM   No results found for: PHENYTOIN, PHENOBARB, VALPROATE, CBMZ   .res Assessment: Plan:    Plan:  1. Lexapro $RemoveBe'20mg'KZhRGmafT$  daily 2. Xanax 0.$RemoveBe'25mg'ksWCrqvud$  TID  RTC 1 year  Patient advised to contact office with any questions, adverse effects, or acute worsening in signs and symptoms.  Discussed potential benefits, risk, and side effects of benzodiazepines to include potential risk of tolerance and dependence, as well as possible drowsiness. Advised patient not to drive if experiencing drowsiness and to take lowest possible effective dose to minimize risk of dependence and tolerance.   Diagnoses and all orders for this visit:  Generalized anxiety disorder -     escitalopram (LEXAPRO) 20 MG tablet; Take 1  tablet (20 mg total) by mouth daily. -     ALPRAZolam (XANAX) 0.25 MG tablet; Take 1 tablet (0.25 mg total) by mouth 3 (three) times daily as needed. for anxiety  Major depressive disorder, recurrent episode, moderate (HCC) -     escitalopram (LEXAPRO) 20 MG tablet; Take 1 tablet (20 mg total) by mouth daily.  Insomnia, unspecified type -     ALPRAZolam (XANAX) 0.25 MG tablet; Take 1 tablet (0.25 mg total) by mouth 3 (three) times daily as needed. for anxiety     Please see After Visit Summary for patient specific instructions.  Future Appointments  Date Time Provider [place]  12/20/2020  9:10 AM Binnie Rail, MD [id number] None    No orders of the defined types were placed in this encounter.   -------------------------------

## 2020-09-03 ENCOUNTER — Telehealth: Payer: Self-pay | Admitting: Adult Health

## 2020-09-03 NOTE — Telephone Encounter (Signed)
Jury summons and excusal letter were faxed 09/03/20

## 2020-10-29 ENCOUNTER — Other Ambulatory Visit: Payer: Self-pay | Admitting: Adult Health

## 2020-10-29 DIAGNOSIS — F411 Generalized anxiety disorder: Secondary | ICD-10-CM

## 2020-10-29 DIAGNOSIS — G4700 Insomnia, unspecified: Secondary | ICD-10-CM

## 2020-10-30 NOTE — Telephone Encounter (Signed)
Due back next year June

## 2020-12-02 ENCOUNTER — Other Ambulatory Visit: Payer: Self-pay | Admitting: Adult Health

## 2020-12-02 DIAGNOSIS — F411 Generalized anxiety disorder: Secondary | ICD-10-CM

## 2020-12-02 DIAGNOSIS — F331 Major depressive disorder, recurrent, moderate: Secondary | ICD-10-CM

## 2020-12-10 LAB — HM MAMMOGRAPHY

## 2020-12-16 ENCOUNTER — Other Ambulatory Visit: Payer: Self-pay | Admitting: Internal Medicine

## 2020-12-19 ENCOUNTER — Encounter: Payer: Self-pay | Admitting: Internal Medicine

## 2020-12-19 NOTE — Patient Instructions (Addendum)
Blood work was ordered.      Medications changes include :   none     Please followup in 1 year    Health Maintenance, Female Adopting a healthy lifestyle and getting preventive care are important in promoting health and wellness. Ask your health care provider about: The right schedule for you to have regular tests and exams. Things you can do on your own to prevent diseases and keep yourself healthy. What should I know about diet, weight, and exercise? Eat a healthy diet  Eat a diet that includes plenty of vegetables, fruits, low-fat dairy products, and lean protein. Do not eat a lot of foods that are high in solid fats, added sugars, or sodium. Maintain a healthy weight Body mass index (BMI) is used to identify weight problems. It estimates body fat based on height and weight. Your health care provider can help determine your BMI and help you achieve or maintain a healthy weight. Get regular exercise Get regular exercise. This is one of the most important things you can do for your health. Most adults should: Exercise for at least 150 minutes each week. The exercise should increase your heart rate and make you sweat (moderate-intensity exercise). Do strengthening exercises at least twice a week. This is in addition to the moderate-intensity exercise. Spend less time sitting. Even light physical activity can be beneficial. Watch cholesterol and blood lipids Have your blood tested for lipids and cholesterol at 60 years of age, then have this test every 5 years. Have your cholesterol levels checked more often if: Your lipid or cholesterol levels are high. You are older than 60 years of age. You are at high risk for heart disease. What should I know about cancer screening? Depending on your health history and family history, you may need to have cancer screening at various ages. This may include screening for: Breast cancer. Cervical cancer. Colorectal cancer. Skin  cancer. Lung cancer. What should I know about heart disease, diabetes, and high blood pressure? Blood pressure and heart disease High blood pressure causes heart disease and increases the risk of stroke. This is more likely to develop in people who have high blood pressure readings or are overweight. Have your blood pressure checked: Every 3-5 years if you are 60-69 years of age. Every year if you are 60 years old or older. Diabetes Have regular diabetes screenings. This checks your fasting blood sugar level. Have the screening done: Once every three years after age 18 if you are at a normal weight and have a low risk for diabetes. More often and at a younger age if you are overweight or have a high risk for diabetes. What should I know about preventing infection? Hepatitis B If you have a higher risk for hepatitis B, you should be screened for this virus. Talk with your health care provider to find out if you are at risk for hepatitis B infection. Hepatitis C Testing is recommended for: Everyone born from [birth year] through 1965. Anyone with known risk factors for hepatitis C. Sexually transmitted infections (STIs) Get screened for STIs, including gonorrhea and chlamydia, if: You are sexually active and are younger than 60 years of age. You are older than 60 years of age and your health care provider tells you that you are at risk for this type of infection. Your sexual activity has changed since you were last screened, and you are at increased risk for chlamydia or gonorrhea. Ask your health care provider if you  are at risk. Ask your health care provider about whether you are at high risk for HIV. Your health care provider may recommend a prescription medicine to help prevent HIV infection. If you choose to take medicine to prevent HIV, you should first get tested for HIV. You should then be tested every 3 months for as long as you are taking the medicine. Pregnancy If you are about to stop  having your period (premenopausal) and you may become pregnant, seek counseling before you get pregnant. Take 400 to 800 micrograms (mcg) of folic acid every day if you become pregnant. Ask for birth control (contraception) if you want to prevent pregnancy. Osteoporosis and menopause Osteoporosis is a disease in which the bones lose minerals and strength with aging. This can result in bone fractures. If you are 60 years old or older, or if you are at risk for osteoporosis and fractures, ask your health care provider if you should: Be screened for bone loss. Take a calcium or vitamin D supplement to lower your risk of fractures. Be given hormone replacement therapy (HRT) to treat symptoms of menopause. Follow these instructions at home: Alcohol use Do not drink alcohol if: Your health care provider tells you not to drink. You are pregnant, may be pregnant, or are planning to become pregnant. If you drink alcohol: Limit how much you have to: 0-1 drink a day. Know how much alcohol is in your drink. In the [place] one drink equals one 12 oz bottle of beer (355 mL), one 5 oz glass of wine (148 mL), or one 1 oz glass of hard liquor (44 mL). Lifestyle Do not use any products that contain nicotine or tobacco. These products include cigarettes, chewing tobacco, and vaping devices, such as e-cigarettes. If you need help quitting, ask your health care provider. Do not use street drugs. Do not share needles. Ask your health care provider for help if you need support or information about quitting drugs. General instructions Schedule regular health, dental, and eye exams. Stay current with your vaccines. Tell your health care provider if: You often feel depressed. You have ever been abused or do not feel safe at home. Summary Adopting a healthy lifestyle and getting preventive care are important in promoting health and wellness. Follow your health care provider's instructions about healthy diet,  exercising, and getting tested or screened for diseases. Follow your health care provider's instructions on monitoring your cholesterol and blood pressure. This information is not intended to replace advice given to you by your health care provider. Make sure you discuss any questions you have with your health care provider. Document Revised: 06/11/2020 Document Reviewed: 06/11/2020 Elsevier Patient Education  [employer].

## 2020-12-19 NOTE — Progress Notes (Signed)
Subjective:    Patient ID: Kristin Rasmussen, female    DOB: January 26, 1961, 60 y.o.   MRN: [id number]   This visit occurred during the SARS-CoV-2 public health emergency.  Safety protocols were in place, including screening questions prior to the visit, additional usage of staff PPE, and extensive cleaning of exam room while observing appropriate contact time as indicated for disinfecting solutions.    HPI She is here for a physical exam.   She is here with her husband.  She has no concerns.  Medications and allergies reviewed with patient and updated if appropriate.  Patient Active Problem List   Diagnosis Date Noted   Hair loss 12/20/2020   Prediabetes 12/24/2019   BMI 37.0-37.9, adult 12/20/2019   Overactive bladder 12/09/2018   Menopausal symptom 11/02/2018   Right wrist pain 12/07/2017   Insulin resistance 10/08/2016   Vitamin D deficiency 09/15/2016   Osteopenia 04/19/2015   Chronic lower back pain 09/19/2014   Anxiety    Metatarsalgia 08/13/2011   DIVERTICULOSIS, COLON 01/20/2008   SKIN CANCER, HX OF 01/20/2008   History of melanoma in situ 10/28/2006   HYPERLIPIDEMIA 10/28/2006   GERD 10/28/2006    Current Outpatient Medications on File Prior to Visit  Medication Sig Dispense Refill   ALPRAZolam (XANAX) 0.25 MG tablet TAKE 1 TABLET BY MOUTH THREE TIMES DAILY AS NEEDED FOR ANXIETY 90 tablet 2   Ascorbic Acid (VITAMIN C) 500 MG CAPS Take by mouth.     baclofen (LIORESAL) 10 MG tablet TAKE 1 TABLET BY MOUTH THREE TIMES DAILY 30 tablet 0   calcium citrate-vitamin D (CITRACAL+D) 315-200 MG-UNIT tablet Take 1 tablet by mouth 2 (two) times daily.     cetirizine (ZYRTEC) 10 MG tablet Take 10 mg by mouth daily.     Cholecalciferol (VITAMIN D3) 50 MCG (2000 UT) capsule Take 2,000 Units by mouth daily.     diphenhydrAMINE (BENADRYL) 25 mg capsule Take 25 mg by mouth every 6 (six) hours as needed.     docusate sodium (COLACE) 100 MG capsule Take 100 mg by mouth daily as needed  for mild constipation.     DOTTI 0.1 MG/24HR patch Place onto the skin.     escitalopram (LEXAPRO) 20 MG tablet Take 1 tablet by mouth once daily 90 tablet 2   estradiol (VIVELLE-DOT) 0.075 MG/24HR Apply 1 patch externally 2 times a week  5   fluorouracil (EFUDEX) 5 % cream fluorouracil 5 % topical cream  APPLY A SMALL AMOUNT TO AFFECTED AREA AS DIRECTED     Multiple Vitamin (MULTIVITAMIN) tablet Take 1 tablet by mouth daily.       nystatin cream (MYCOSTATIN) APPLY CREAM TOPICALLY TO AFFECTED AREA TWICE DAILY     oxybutynin (DITROPAN XL) 15 MG 24 hr tablet      No current facility-administered medications on file prior to visit.    Past Medical History:  Diagnosis Date   Anemia 2004   HCT 34.7   Anxiety    follows with psyc for same   Back pain    Basal cell cancer    x3; Dr Tonia Brooms   Bladder leak    Chest pain 06/2010   Costochondritis   Diverticulosis    Gallbladder problem    GERD (gastroesophageal reflux disease)    Hyperlipidemia    IBS (irritable bowel syndrome)    Melanoma ([place]) 2006   x1   PCOS (polycystic ovarian syndrome)    Plantar fasciitis 08/13/2011   Left is significant and  RT is mild by [place] criteria    Spasm of esophagus    Swelling    Thrombocytopenia ([place]) 2002   133,000 platelets    Past Surgical History:  Procedure Laterality Date   CHOLECYSTECTOMY  2002   [facility]   COLONOSCOPY     MELANOMA EXCISION     LUE 2006  [facility]; Derm : Dr Crista Luria   SHOULDER SURGERY     Left   TONSILLECTOMY     TOTAL ABDOMINAL HYSTERECTOMY  1991   abnormal PAP hx    Social History   Socioeconomic History   Marital status: Married    Spouse name: Not on file   Number of children: 3   Years of education: 12   Highest education level: Not on file  Occupational History   Occupation: Counsellor: [employer]  Tobacco Use   Smoking status: Never   Smokeless tobacco: Never  Substance and Sexual Activity   Alcohol use: Yes    Comment:  rarely, only holiday's   Drug use: No   Sexual activity: Not on file  Other Topics Concern   Not on file  Social History Narrative   Does some yard work and walking for exercise - couple of days a week   Social Determinants of Radio broadcast assistant Strain: Not on file  Food Insecurity: Not on file  Transportation Needs: Not on file  Physical Activity: Not on file  Stress: Not on file  Social Connections: Not on file    Family History  Problem Relation Age of Onset   Diabetes Mother    Hypertension Mother    Obesity Mother    Hypertension Father    Colon polyps Father    GER disease Father    Skin cancer Father    Ovarian cancer Paternal Aunt    Heart attack Maternal Grandmother 76   Diabetes Maternal Grandmother    Heart attack Maternal Grandfather 5   Stroke Maternal Grandfather 32   Colon cancer Paternal Grandfather        unsure age    Review of Systems  Constitutional:  Negative for chills and fever.  Eyes:  Negative for visual disturbance.  Respiratory:  Negative for cough, shortness of breath and wheezing.   Cardiovascular:  Negative for chest pain, palpitations and leg swelling.  Gastrointestinal:  Negative for abdominal pain, blood in stool, constipation, diarrhea and nausea.       Occ gerd - 1-2 times a week  Genitourinary:  Negative for dysuria and hematuria.  Musculoskeletal:  Positive for back pain (lower back pain). Negative for arthralgias.  Skin:  Negative for color change and rash.  Neurological:  Negative for light-headedness and headaches.  Psychiatric/Behavioral:  Negative for dysphoric mood. The patient is not nervous/anxious.       Objective:   Vitals:   12/20/20 0936  BP: 116/78  Pulse: 70  Temp: 97.9 F (36.6 C)  SpO2: 98%   Filed Weights   12/20/20 0936  Weight: 213 lb (96.6 kg)   Body mass index is 37.14 kg/m.  BP Readings from Last 3 Encounters:  12/20/20 116/78  12/20/19 128/74  12/09/18 128/76    Wt Readings  from Last 3 Encounters:  12/20/20 213 lb (96.6 kg)  12/20/19 217 lb 3.2 oz (98.5 kg)  12/09/18 207 lb 12.8 oz (94.3 kg)    Depression screen [facility] 2/9 12/20/2020 12/20/2019 12/07/2017 12/07/2017 06/03/2016  Decreased Interest 0 0 0  0 1  Down, Depressed, Hopeless 1 1 0 0 1  PHQ - 2 Score 1 1 0 0 2  Altered sleeping 0 0 0 - 1  Tired, decreased energy 0 0 0 - 1  Change in appetite 0 0 0 - 1  Feeling bad or failure about yourself  0 0 - - 1  Trouble concentrating 0 0 0 - 0  Moving slowly or fidgety/restless 0 0 0 - 0  Suicidal thoughts 0 0 0 - 0  PHQ-9 Score 1 1 0 - 6  Difficult doing work/chores Somewhat difficult Not difficult at all - - -     GAD 7 : Generalized Anxiety Score 12/20/2020  Nervous, Anxious, on Edge 0  Control/stop worrying 1  Worry too much - different things 1  Trouble relaxing 0  Restless 0  Easily annoyed or irritable 1  Afraid - awful might happen 0  Total GAD 7 Score 3  Anxiety Difficulty Somewhat difficult       Physical Exam Constitutional: She appears well-developed and well-nourished. No distress.  HENT:  Head: Normocephalic and atraumatic.  Right Ear: External ear normal. Normal ear canal and TM Left Ear: External ear normal.  Normal ear canal and TM Mouth/Throat: Oropharynx is clear and moist.  Eyes: Conjunctivae and EOM are normal.  Neck: Neck supple. No tracheal deviation present. No thyromegaly present.  No carotid bruit  Cardiovascular: Normal rate, regular rhythm and normal heart sounds.   No murmur heard.  No edema. Pulmonary/Chest: Effort normal and breath sounds normal. No respiratory distress. She has no wheezes. She has no rales.  Breast: deferred   Abdominal: Soft. She exhibits no distension. There is no tenderness.  Lymphadenopathy: She has no cervical adenopathy.  Skin: Skin is warm and dry. She is not diaphoretic.  Psychiatric: She has a normal mood and affect. Her behavior is normal.     Lab Results  Component Value Date    WBC 7.5 12/20/2019   HGB 15.1 (H) 12/20/2019   HCT 44.8 12/20/2019   PLT 191.0 12/20/2019   GLUCOSE 87 12/20/2019   CHOL 187 12/20/2019   TRIG 197.0 (H) 12/20/2019   HDL 42.90 12/20/2019   LDLDIRECT 156.9 01/20/2008   LDLCALC 104 (H) 12/20/2019   ALT 19 12/20/2019   AST 17 12/20/2019   NA 137 12/20/2019   K 3.9 12/20/2019   CL 100 12/20/2019   CREATININE 0.56 12/20/2019   BUN 8 12/20/2019   CO2 32 12/20/2019   TSH 1.83 12/20/2019   INR 1.00 06/26/2010   HGBA1C 5.7 12/20/2019         Assessment & Plan:   Physical exam: Screening blood work  ordered Exercise  no regular Weight encouraged weight loss Substance abuse  none Sees derm annually   Screened for depression using the PHQ 9 scale.  No evidence of depression.    Reviewed recommended immunizations.   Health Maintenance  Topic Date Due   COVID-19 Vaccine (3 - Moderna risk series) 01/05/2021 (Originally 05/11/2019)   MAMMOGRAM  12/05/2021   COLONOSCOPY (Pts 45-75yrs Insurance coverage will need to be confirmed)  06/01/2022   DEXA SCAN  12/09/2023   TETANUS/TDAP  12/20/2029   INFLUENZA VACCINE  Completed   Hepatitis C Screening  Completed   HIV Screening  Completed   Zoster Vaccines- Shingrix  Completed   HPV VACCINES  Aged Out   Pneumococcal Vaccine 54-60 Years old  Discontinued          See Problem  List for Assessment and Plan of chronic medical problems.

## 2020-12-20 ENCOUNTER — Other Ambulatory Visit: Payer: Self-pay

## 2020-12-20 ENCOUNTER — Encounter: Payer: Self-pay | Admitting: Internal Medicine

## 2020-12-20 ENCOUNTER — Ambulatory Visit (INDEPENDENT_AMBULATORY_CARE_PROVIDER_SITE_OTHER): Payer: PRIVATE HEALTH INSURANCE | Admitting: Internal Medicine

## 2020-12-20 VITALS — BP 116/78 | HR 70 | Temp 97.9°F | Ht 63.5 in | Wt 213.0 lb

## 2020-12-20 DIAGNOSIS — Z6837 Body mass index (BMI) 37.0-37.9, adult: Secondary | ICD-10-CM

## 2020-12-20 DIAGNOSIS — Z0000 Encounter for general adult medical examination without abnormal findings: Secondary | ICD-10-CM

## 2020-12-20 DIAGNOSIS — K219 Gastro-esophageal reflux disease without esophagitis: Secondary | ICD-10-CM

## 2020-12-20 DIAGNOSIS — E782 Mixed hyperlipidemia: Secondary | ICD-10-CM

## 2020-12-20 DIAGNOSIS — M85852 Other specified disorders of bone density and structure, left thigh: Secondary | ICD-10-CM | POA: Diagnosis not present

## 2020-12-20 DIAGNOSIS — E559 Vitamin D deficiency, unspecified: Secondary | ICD-10-CM | POA: Diagnosis not present

## 2020-12-20 DIAGNOSIS — R7303 Prediabetes: Secondary | ICD-10-CM

## 2020-12-20 DIAGNOSIS — N3281 Overactive bladder: Secondary | ICD-10-CM

## 2020-12-20 DIAGNOSIS — F419 Anxiety disorder, unspecified: Secondary | ICD-10-CM | POA: Diagnosis not present

## 2020-12-20 DIAGNOSIS — L659 Nonscarring hair loss, unspecified: Secondary | ICD-10-CM | POA: Diagnosis not present

## 2020-12-20 DIAGNOSIS — Z1331 Encounter for screening for depression: Secondary | ICD-10-CM | POA: Diagnosis not present

## 2020-12-20 LAB — COMPREHENSIVE METABOLIC PANEL
ALT: 17 U/L (ref 0–35)
AST: 15 U/L (ref 0–37)
Albumin: 4.2 g/dL (ref 3.5–5.2)
Alkaline Phosphatase: 44 U/L (ref 39–117)
BUN: 9 mg/dL (ref 6–23)
CO2: 33 mEq/L — ABNORMAL HIGH (ref 19–32)
Calcium: 9.1 mg/dL (ref 8.4–10.5)
Chloride: 99 mEq/L (ref 96–112)
Creatinine, Ser: 0.55 mg/dL (ref 0.40–1.20)
GFR: 99.74 mL/min (ref >60.00)
Glucose, Bld: 91 mg/dL (ref 70–99)
Potassium: 4 mEq/L (ref 3.5–5.1)
Sodium: 137 mEq/L (ref 135–145)
Total Bilirubin: 0.4 mg/dL (ref 0.2–1.2)
Total Protein: 6.9 g/dL (ref 6.0–8.3)

## 2020-12-20 LAB — CBC WITH DIFFERENTIAL/PLATELET
Basophils Absolute: 0 10*3/uL (ref 0.0–0.1)
Basophils Relative: 0.5 % (ref 0.0–3.0)
Eosinophils Absolute: 0.2 10*3/uL (ref 0.0–0.7)
Eosinophils Relative: 3 % (ref 0.0–5.0)
HCT: 44.4 % (ref 36.0–46.0)
Hemoglobin: 15 g/dL (ref 12.0–15.0)
Lymphocytes Relative: 23.6 % (ref 12.0–46.0)
Lymphs Abs: 1.7 10*3/uL (ref 0.7–4.0)
MCHC: 33.7 g/dL (ref 30.0–36.0)
MCV: 89.1 fl (ref 78.0–100.0)
Monocytes Absolute: 0.4 10*3/uL (ref 0.1–1.0)
Monocytes Relative: 5 % (ref 3.0–12.0)
Neutro Abs: 5 10*3/uL (ref 1.4–7.7)
Neutrophils Relative %: 67.9 % (ref 43.0–77.0)
Platelets: 190 10*3/uL (ref 150.0–400.0)
RBC: 4.98 Mil/uL (ref 3.87–5.11)
RDW: 14.2 % (ref 11.5–15.5)
WBC: 7.4 10*3/uL (ref 4.0–10.5)

## 2020-12-20 LAB — TSH: TSH: 2.08 u[IU]/mL (ref 0.35–5.50)

## 2020-12-20 LAB — LIPID PANEL
Cholesterol: 191 mg/dL (ref 0–200)
HDL: 40.3 mg/dL (ref >39.00)
NonHDL: 150.85
Total CHOL/HDL Ratio: 5
Triglycerides: 215 mg/dL — ABNORMAL HIGH (ref 0.0–149.0)
VLDL: 43 mg/dL — ABNORMAL HIGH (ref 0.0–40.0)

## 2020-12-20 LAB — VITAMIN D 25 HYDROXY (VIT D DEFICIENCY, FRACTURES): VITD: 42.1 ng/mL (ref 30.00–100.00)

## 2020-12-20 LAB — LDL CHOLESTEROL, DIRECT: Direct LDL: 124 mg/dL

## 2020-12-20 LAB — VITAMIN B12: Vitamin B-12: 740 pg/mL (ref 211–911)

## 2020-12-20 LAB — HEMOGLOBIN A1C: Hgb A1c MFr Bld: 5.7 % (ref 4.6–6.5)

## 2020-12-20 MED ORDER — PRAVASTATIN SODIUM 20 MG PO TABS: 20.0000 mg | ORAL_TABLET | Freq: Every day | ORAL | 3 refills | Status: DC

## 2020-12-20 MED ORDER — ESOMEPRAZOLE MAGNESIUM 40 MG PO CPDR: 40.0000 mg | DELAYED_RELEASE_CAPSULE | Freq: Every day | ORAL | 2 refills | Status: DC

## 2020-12-20 NOTE — Assessment & Plan Note (Signed)
Chronic Advised weight loss With hyperlipidemia, prediabetes Advised regular exercise, healthy diet, dec portions

## 2020-12-20 NOTE — Assessment & Plan Note (Signed)
Chronic Taking vitamin D daily Check vitamin D level  

## 2020-12-20 NOTE — Assessment & Plan Note (Signed)
Chronic GERD controlled, but does have occasional GERD once a week, at most twice a week Continue Nexium 40 mg daily Stressed lifestyle changes-avoid foods that cause GERD, work on weight loss, do not eat late at night or large meals Discussed possible long-term consequences of the medication she is taking

## 2020-12-20 NOTE — Assessment & Plan Note (Signed)
Chronic Check lipid panel, cmp  Continue pravastatin 20 mg daily Regular exercise and healthy diet encouraged

## 2020-12-20 NOTE — Assessment & Plan Note (Signed)
Chronic Management per psych

## 2020-12-20 NOTE — Assessment & Plan Note (Signed)
Chronic Check a1c Low sugar / carb diet Stressed regular exercise  

## 2020-12-20 NOTE — Assessment & Plan Note (Signed)
Chronic On oxybutrynin

## 2020-12-20 NOTE — Assessment & Plan Note (Signed)
Saw derm in March '22 - ferritin was low She was taking prescribed iron Level went up minimally after a few months - put on a different iron prescription - having GI side effects Currently not taking iron  Check iron panel, B12

## 2020-12-20 NOTE — Assessment & Plan Note (Signed)
Chronic DEXA up-to-date Stressed regular exercise Taking vitamin D and calcium Will check vitamin D level

## 2020-12-21 LAB — IRON,TIBC AND FERRITIN PANEL
%SAT: 28 % (calc) (ref 16–45)
Ferritin: 38 ng/mL (ref 16–232)
Iron: 87 ug/dL (ref 45–160)
TIBC: 306 mcg/dL (calc) (ref 250–450)

## 2021-01-10 ENCOUNTER — Telehealth (INDEPENDENT_AMBULATORY_CARE_PROVIDER_SITE_OTHER): Payer: No Typology Code available for payment source | Admitting: Family Medicine

## 2021-01-10 DIAGNOSIS — R0981 Nasal congestion: Secondary | ICD-10-CM

## 2021-01-10 MED ORDER — BENZONATATE 100 MG PO CAPS: ORAL_CAPSULE | ORAL | 0 refills | Status: DC

## 2021-01-10 MED ORDER — AMOXICILLIN-POT CLAVULANATE 875-125 MG PO TABS: 1.0000 | ORAL_TABLET | Freq: Two times a day (BID) | ORAL | 0 refills | Status: DC

## 2021-01-10 MED ORDER — FLUCONAZOLE 150 MG PO TABS: 150.0000 mg | ORAL_TABLET | Freq: Every day | ORAL | 0 refills | Status: DC

## 2021-01-10 NOTE — Patient Instructions (Signed)
-  I sent the medication(s) we discussed to your pharmacy: Meds ordered this encounter  Medications   amoxicillin-clavulanate (AUGMENTIN) 875-125 MG tablet    Sig: Take 1 tablet by mouth 2 (two) times daily.    Dispense:  20 tablet    Refill:  0   benzonatate (TESSALON PERLES) 100 MG capsule    Sig: 1-2 capsules up to twice daily as needed for cough    Dispense:  30 capsule    Refill:  0   fluconazole (DIFLUCAN) 150 MG tablet    Sig: Take 1 tablet (150 mg total) by mouth daily.    Dispense:  1 tablet    Refill:  0     I hope you are feeling better soon!  Seek in person care promptly if your symptoms worsen, new concerns arise or you are not improving with treatment.  It was nice to meet you today. I help [facility] out with telemedicine visits on Tuesdays and Thursdays and am happy to help if you need a virtual follow up visit on those days. Otherwise, if you have any concerns or questions following this visit please schedule a follow up visit with your Primary Care office or seek care at a local urgent care clinic to avoid delays in care

## 2021-01-10 NOTE — Progress Notes (Signed)
Virtual Visit via Telephone Note  I connected with Kristin Rasmussen on 01/10/21 at  5:20 PM EST by telephone and verified that I am speaking with the correct person using two identifiers.   I discussed the limitations, risks, security and privacy concerns of performing an evaluation and management service by telephone and the availability of in person appointments. I also discussed with the patient that there may be a patient responsible charge related to this service. The patient expressed understanding and agreed to proceed.  Location patient: home, [place] Location provider: work or home office Participants present for the call: patient, provider Patient did not have a visit with me in the prior 7 days to address this/these issue(s).   History of Present Illness:  Acute telemedicine visit for sinus issues: -Onset: over 1 week -Symptoms include: head congestion, green mucus, pnd, cough, sore throat from the drainage, sinus discomfort -Denies:fevers, cp, sob -neg covid test -Pertinent past medical history:see below -Pertinent medication allergies:No Known Allergies -COVID-19 vaccine status: Immunization History  Administered Date(s) Administered   Influenza, High Dose Seasonal PF 11/22/2019   Influenza,inj,Quad PF,6+ Mos 12/01/2012   Influenza-Unspecified 11/08/2014, 11/28/2015, 11/18/2016, 11/18/2018, 12/13/2020   Moderna Sars-Covid-2 Vaccination 03/16/2019, 04/13/2019   Td 02/03/1998, 10/23/2009   Tdap 12/21/2019   Zoster Recombinat (Shingrix) 12/07/2017, 02/28/2018   Past Medical History:  Diagnosis Date   Anemia 2004   HCT 34.7   Anxiety    follows with psyc for same   Back pain    Basal cell cancer    x3; Dr Tonia Brooms   Bladder leak    Chest pain 06/2010   Costochondritis   Diverticulosis    Gallbladder problem    GERD (gastroesophageal reflux disease)    Hyperlipidemia    IBS (irritable bowel syndrome)    Melanoma ([place]) 2006   x1   PCOS (polycystic ovarian syndrome)     Plantar fasciitis 08/13/2011   Left is significant and RT is mild by [place] criteria    Spasm of esophagus    Swelling    Thrombocytopenia ([place]) 2002   133,000 platelets      Observations/Objective: Patient sounds cheerful and well on the phone. I do not appreciate any SOB. Speech and thought processing are grossly intact. Patient reported vitals:  Assessment and Plan:  Nasal sinus congestion  -we discussed possible serious and likely etiologies, options for evaluation and workup, limitations of telemedicine visit vs in person visit, treatment, treatment risks and precautions. Pt prefers to treat via telemedicine empirically rather than in person at this moment. Likely viral resp illness vs other. She feels needs abx. Discussed indications, risks, viral vs bacterial infection. Opted for cough rx and nasal saline with delayed abx rx sent incase worsening or not improving as expected. Advised to seek prompt in person care if worsening, new symptoms arise, or if is not improving with treatment. Advised of options for inperson care in case PCP office not available. Did let the patient know that I only do telemedicine shifts for [facility] on Tuesdays and Thursdays and advised a follow up visit with PCP or at an [facility] if has further questions or concerns.   Follow Up Instructions:  I did not refer this patient for an OV with me in the next 24 hours for this/these issue(s).  I discussed the assessment and treatment plan with the patient. The patient was provided an opportunity to ask questions and all were answered. The patient agreed with the plan and demonstrated an understanding of the  instructions.   I spent 18 minutes on the date of this visit in the care of this patient. See summary of tasks completed to properly care for this patient in the detailed notes above which also included counseling of above, review of PMH, medications, allergies, evaluation of the patient and ordering and/or   instructing patient on testing and care options.     Lucretia Kern, DO

## 2021-02-13 ENCOUNTER — Other Ambulatory Visit: Payer: Self-pay | Admitting: Internal Medicine

## 2021-02-24 ENCOUNTER — Other Ambulatory Visit: Payer: Self-pay | Admitting: Adult Health

## 2021-02-24 DIAGNOSIS — F411 Generalized anxiety disorder: Secondary | ICD-10-CM

## 2021-02-24 DIAGNOSIS — G4700 Insomnia, unspecified: Secondary | ICD-10-CM

## 2021-02-26 NOTE — Telephone Encounter (Signed)
Last filled 12/11 appt on 6/2

## 2021-03-07 ENCOUNTER — Other Ambulatory Visit: Payer: Self-pay

## 2021-03-07 ENCOUNTER — Telehealth: Payer: Self-pay | Admitting: Adult Health

## 2021-03-07 MED ORDER — ALPRAZOLAM 0.5 MG PO TABS: ORAL_TABLET | ORAL | 0 refills | Status: DC

## 2021-03-07 NOTE — Telephone Encounter (Signed)
Pended.

## 2021-03-07 NOTE — Telephone Encounter (Signed)
Please review

## 2021-03-07 NOTE — Telephone Encounter (Signed)
Patient lvm stating that her pharmacy is on backorder for Alprazolam 0.25 but they do have 0.5mg . She would like to know if a prescription can be sent in for that instead and cut in half for the time being. Pls rtc to discuss [phone]

## 2021-03-07 NOTE — Telephone Encounter (Signed)
Ok to pend.

## 2021-04-16 ENCOUNTER — Other Ambulatory Visit: Payer: Self-pay | Admitting: Adult Health

## 2021-05-03 ENCOUNTER — Encounter (HOSPITAL_COMMUNITY)
Admission: RE | Admit: 2021-05-03 | Discharge: 2021-05-03 | Disposition: A | Discharge status: Home / Self Care | Payer: PRIVATE HEALTH INSURANCE | Source: Ambulatory Visit | Attending: Ophthalmology | Admitting: Ophthalmology

## 2021-05-03 ENCOUNTER — Encounter (HOSPITAL_COMMUNITY): Payer: Self-pay

## 2021-05-08 NOTE — H&P (Signed)
Surgical History & Physical ? ?Patient Name: Kristin Rasmussen DOB: [birth year] ? ?Surgery: Cataract extraction with intraocular lens implant phacoemulsification; Right Eye ? ?Surgeon: Baruch Goldmann MD ?Surgery Date:  05-13-21 ?Pre-Op Date:  05-06-21 ? ?HPI: ?A 67 Yr. old female patient The patient is returning for a cataract follow-up of both eyes and referred from Dr. Marin Comment. Since the last visit, the affected area is worsening. The patient's vision is blurry. The complaint is associated with light sensitivity. Patient has difficulty driving at night due to halos/glare, reading small print on labels/books, and difficulty recognizing people at a distance. This is negatively affecting the patient's quality of life and the patient is unable to function adequately in life with the current level of vision. HPI was performed by Baruch Goldmann . ? ?Medical History: ?Glaucoma ?Cancer ? ?Review of Systems ?Negative Allergic/Immunologic ?Negative Cardiovascular ?Negative Constitutional ?Negative Ear, Nose, Mouth & Throat ?Negative Endocrine ?Negative Eyes ?Negative Gastrointestinal ?Negative Genitourinary ?Negative Hemotologic/Lymphatic ?Negative Integumentary ?Negative Musculoskeletal ?Negative Neurological ?Negative Psychiatry ?Negative Respiratory ? ?Social ?  Never smoked  ? ?Medication ?Artificial Tears, Gen teal,  ?Esomeprazole Magnesium, Alprazolam, Escitalopram, Oxybutynin Chloride, Pravastatin, Estradiol, Cetirizine, Calcium citrate, Biotin, Vitamin D3, Vitamin c, Magnesium, Docusate sodium, Baclofen, Acetaminophinen, Ibuprofen, Pseudoepjemdrine, Diphenhydramine HCl, Tussin DM, Dextroamphetamine-amphetamine, Alka-seltzer plus,  ? ?Sx/Procedures ?Hysterectomy, Tonsilectomy, Gall bladder removal, Skin cancer melanoma,  ? ?Drug Allergies  ? NKDA ? ?History & Physical: ?Heent: Cataract, right eye ?NECK: supple without bruits ?LUNGS: lungs clear to auscultation ?CV: regular rate and rhythm ?Abdomen: soft and  non-tender ? ?Impression & Plan: ?Assessment: ?1.  NUCLEAR SCLEROSIS AGE RELATED; Both Eyes (H25.13) ?2.  AMBLYOPIA REFRACTIVE; Left Eye (H53.022) ?3.  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 ?Plan: 1.  Cataract accounts for the patient's decreased vision. This visual impairment is not correctable with a tolerable change in glasses or contact lenses. Cataract surgery with an implantation of a new lens should significantly improve the visual and functional status of the patient. Discussed all risks, benefits, alternatives, and potential complications. Discussed the procedures and recovery. Patient desires to have surgery. A-scan ordered and performed today for intra-ocular lens calculations. The surgery will be performed in order to improve vision for driving, reading, and for eye examinations. Recommend phacoemulsification with intra-ocular lens. Recommend Dextenza for post-operative pain and inflammation. ?Right Eye worse - first. ?Dilates well - shugarcaine by protocol. ?Vivty Toric Lens. ? ?2.  Chronic. ? ?3.  LIkely toric IOL OU. ? ?4.  Based on cup-to-disc ratio. ?Negative Family history. ?OCT rNFL shows: WNL OD, Borderline OS. ?IOP check ?

## 2021-05-13 ENCOUNTER — Ambulatory Visit (HOSPITAL_COMMUNITY): Payer: PRIVATE HEALTH INSURANCE | Admitting: Anesthesiology

## 2021-05-13 ENCOUNTER — Ambulatory Visit (HOSPITAL_BASED_OUTPATIENT_CLINIC_OR_DEPARTMENT_OTHER): Payer: PRIVATE HEALTH INSURANCE | Admitting: Anesthesiology

## 2021-05-13 ENCOUNTER — Encounter (HOSPITAL_COMMUNITY)
Admission: RE | Disposition: A | Discharge status: Home / Self Care | Payer: Self-pay | Source: Home / Self Care | Attending: Ophthalmology

## 2021-05-13 ENCOUNTER — Ambulatory Visit (HOSPITAL_COMMUNITY)
Admission: RE | Admit: 2021-05-13 | Discharge: 2021-05-13 | Disposition: A | Discharge status: Home / Self Care | Payer: PRIVATE HEALTH INSURANCE | Attending: Ophthalmology | Admitting: Ophthalmology

## 2021-05-13 DIAGNOSIS — H52223 Regular astigmatism, bilateral: Secondary | ICD-10-CM | POA: Diagnosis not present

## 2021-05-13 DIAGNOSIS — H2511 Age-related nuclear cataract, right eye: Secondary | ICD-10-CM | POA: Insufficient documentation

## 2021-05-13 DIAGNOSIS — H40013 Open angle with borderline findings, low risk, bilateral: Secondary | ICD-10-CM | POA: Diagnosis not present

## 2021-05-13 DIAGNOSIS — H53022 Refractive amblyopia, left eye: Secondary | ICD-10-CM | POA: Insufficient documentation

## 2021-05-13 DIAGNOSIS — D649 Anemia, unspecified: Secondary | ICD-10-CM

## 2021-05-13 DIAGNOSIS — K219 Gastro-esophageal reflux disease without esophagitis: Secondary | ICD-10-CM | POA: Diagnosis not present

## 2021-05-13 DIAGNOSIS — D759 Disease of blood and blood-forming organs, unspecified: Secondary | ICD-10-CM | POA: Insufficient documentation

## 2021-05-13 HISTORY — PX: CATARACT EXTRACTION W/PHACO: SHX586

## 2021-05-13 SURGERY — PHACOEMULSIFICATION, CATARACT, WITH IOL INSERTION
Anesthesia: Monitor Anesthesia Care | Site: Eye | Laterality: Right

## 2021-05-13 MED ORDER — EPINEPHRINE PF 1 MG/ML IJ SOLN
INTRAOCULAR | Status: DC | PRN
  Administered 2021-05-13: 500 mL

## 2021-05-13 MED ORDER — LIDOCAINE HCL (PF) 1 % IJ SOLN
INTRAOCULAR | Status: DC | PRN
  Administered 2021-05-13: 1 mL via OPHTHALMIC

## 2021-05-13 MED ORDER — SODIUM HYALURONATE 23MG/ML IO SOSY
PREFILLED_SYRINGE | INTRAOCULAR | Status: DC | PRN
  Administered 2021-05-13: 0.6 mL via INTRAOCULAR

## 2021-05-13 MED ORDER — PHENYLEPHRINE HCL 2.5 % OP SOLN
1.0000 [drp] | OPHTHALMIC | Status: AC | PRN
  Administered 2021-05-13 (×3): 1 [drp] via OPHTHALMIC

## 2021-05-13 MED ORDER — EPINEPHRINE PF 1 MG/ML IJ SOLN
INTRAMUSCULAR | Status: AC
  Filled 2021-05-13: qty 2

## 2021-05-13 MED ORDER — BSS IO SOLN
INTRAOCULAR | Status: DC | PRN
  Administered 2021-05-13: 15 mL via INTRAOCULAR

## 2021-05-13 MED ORDER — POVIDONE-IODINE 5 % OP SOLN
OPHTHALMIC | Status: DC | PRN
  Administered 2021-05-13: 1 via OPHTHALMIC

## 2021-05-13 MED ORDER — TROPICAMIDE 1 % OP SOLN
1.0000 [drp] | OPHTHALMIC | Status: AC | PRN
  Administered 2021-05-13 (×3): 1 [drp] via OPHTHALMIC

## 2021-05-13 MED ORDER — NEOMYCIN-POLYMYXIN-DEXAMETH 3.5-10000-0.1 OP SUSP
OPHTHALMIC | Status: DC | PRN
  Administered 2021-05-13: 1 [drp] via OPHTHALMIC

## 2021-05-13 MED ORDER — LIDOCAINE HCL 3.5 % OP GEL
1.0000 "application " | Freq: Once | OPHTHALMIC | Status: AC
  Administered 2021-05-13: 1 via OPHTHALMIC

## 2021-05-13 MED ORDER — STERILE WATER FOR IRRIGATION IR SOLN
Status: DC | PRN
Start: 2021-05-13 — End: 2021-05-13
  Administered 2021-05-13: 250 mL

## 2021-05-13 MED ORDER — SODIUM HYALURONATE 10 MG/ML IO SOLUTION
PREFILLED_SYRINGE | INTRAOCULAR | Status: DC | PRN
  Administered 2021-05-13: 0.85 mL via INTRAOCULAR

## 2021-05-13 MED ORDER — TETRACAINE HCL 0.5 % OP SOLN
1.0000 [drp] | OPHTHALMIC | Status: AC | PRN
  Administered 2021-05-13 (×3): 1 [drp] via OPHTHALMIC

## 2021-05-13 SURGICAL SUPPLY — 16 items
CATARACT SUITE SIGHTPATH (MISCELLANEOUS) ×2 IMPLANT
CLOTH BEACON ORANGE TIMEOUT ST (SAFETY) ×2 IMPLANT
EYE SHIELD UNIVERSAL CLEAR (GAUZE/BANDAGES/DRESSINGS) ×1 IMPLANT
FEE CATARACT SUITE SIGHTPATH (MISCELLANEOUS) ×1 IMPLANT
GLOVE SURG UNDER POLY LF SZ6.5 (GLOVE) ×1 IMPLANT
GLOVE SURG UNDER POLY LF SZ7 (GLOVE) ×1 IMPLANT
LENS IOL ACRYSOF VIVITY 24.5 ×2 IMPLANT
LENS IOL VIVITY 515 24.5 IMPLANT
NDL HYPO 18GX1.5 BLUNT FILL (NEEDLE) ×1 IMPLANT
NEEDLE HYPO 18GX1.5 BLUNT FILL (NEEDLE) ×2 IMPLANT
PAD ARMBOARD 7.5X6 YLW CONV (MISCELLANEOUS) ×2 IMPLANT
RING MALYGIN 7.0 (MISCELLANEOUS) IMPLANT
SYR TB 1ML LL NO SAFETY (SYRINGE) ×2 IMPLANT
TAPE SURG TRANSPORE 1 IN (GAUZE/BANDAGES/DRESSINGS) IMPLANT
TAPE SURGICAL TRANSPORE 1 IN (GAUZE/BANDAGES/DRESSINGS) ×2
WATER STERILE IRR 250ML POUR (IV SOLUTION) ×2 IMPLANT

## 2021-05-13 NOTE — Anesthesia Preprocedure Evaluation (Signed)
Anesthesia Evaluation  ?Patient identified by MRN, date of birth, ID band ?Patient awake ? ? ? ?Reviewed: ?Allergy & Precautions, NPO status , Patient's Chart, lab work & pertinent test results ? ?Airway ?Mallampati: II ? ?TM Distance: >3 FB ?Neck ROM: Full ? ? ? Dental ? ?(+) Dental Advisory Given ?Crowns :   ?Pulmonary ?neg pulmonary ROS,  ?  ?Pulmonary exam normal ?breath sounds clear to auscultation ? ? ? ? ? ? Cardiovascular ?negative cardio ROS ?Normal cardiovascular exam ?Rhythm:Regular Rate:Normal ? ? ?  ?Neuro/Psych ?negative neurological ROS ? negative psych ROS  ? GI/Hepatic ?Neg liver ROS, GERD  Medicated and Controlled,  ?Endo/Other  ?negative endocrine ROS ? Renal/GU ?negative Renal ROS  ?negative genitourinary ?  ?Musculoskeletal ?negative musculoskeletal ROS ?(+)  ? Abdominal ?  ?Peds ?negative pediatric ROS ?(+)  Hematology ? ?(+) Blood dyscrasia, anemia ,   ?Anesthesia Other Findings ? ? Reproductive/Obstetrics ?negative OB ROS ? ?  ? ? ? ? ? ? ? ? ? ? ? ? ? ?  ?  ? ? ? ? ? ? ? ? ?Anesthesia Physical ?Anesthesia Plan ? ?ASA: 2 ? ?Anesthesia Plan: MAC  ? ?Post-op Pain Management: Minimal or no pain anticipated  ? ?Induction: Intravenous ? ?PONV Risk Score and Plan:  ? ?Airway Management Planned: Nasal Cannula and Natural Airway ? ?Additional Equipment:  ? ?Intra-op Plan:  ? ?Post-operative Plan:  ? ?Informed Consent: I have reviewed the patients History and Physical, chart, labs and discussed the procedure including the risks, benefits and alternatives for the proposed anesthesia with the patient or authorized representative who has indicated his/her understanding and acceptance.  ? ? ? ?Dental advisory given ? ?Plan Discussed with: CRNA and Surgeon ? ?Anesthesia Plan Comments:   ? ? ? ? ? ? ?Anesthesia Quick Evaluation ? ?

## 2021-05-13 NOTE — Op Note (Signed)
Date of procedure: 05/13/21 ? ?Pre-operative diagnosis: Visually significant age-related nuclear cataract, Right Eye (H25.11) ? ?Post-operative diagnosis: Visually significant age-related nuclear cataract, Right Eye ? ?Procedure: Removal of cataract via phacoemulsification and insertion of intra-ocular lens Alcon Vivity DAT015 +24.0D into the capsular bag of the Right Eye ? ?Attending surgeon: Gerda Diss. Marisa Hua, MD, MA ? ?Anesthesia: MAC, Topical [place] ? ?Complications: None ? ?Estimated Blood Loss: <54m (minimal) ? ?Specimens: None ? ?Implants: As above ? ?Indications:  Visually significant age-related cataract, Right Eye ? ?Procedure:  ?The patient was seen and identified in the pre-operative area. The operative eye was identified and dilated.  The operative eye was marked.  Topical anesthesia was administered to the operative eye.    ? ?The patient was then to the operative suite and placed in the supine position.  A timeout was performed confirming the patient, procedure to be performed, and all other relevant information.   The patient's face was prepped and draped in the usual fashion for intra-ocular surgery.  A lid speculum was placed into the operative eye and the surgical microscope moved into place and focused.  A superotemporal paracentesis was created using a 20 gauge paracentesis blade.  Shugarcaine was injected into the anterior chamber.  Viscoelastic was injected into the anterior chamber.  A temporal clear-corneal main wound incision was created using a 2.475mmicrokeratome.  A continuous curvilinear capsulorrhexis was initiated using an irrigating cystitome and completed using capsulorrhexis forceps.  Hydrodissection and hydrodeliniation were performed.  Viscoelastic was injected into the anterior chamber.  A phacoemulsification handpiece and a chopper as a second instrument were used to remove the nucleus and epinucleus. The irrigation/aspiration handpiece was used to remove any remaining cortical  material.  ? ?The capsular bag was reinflated with viscoelastic, checked, and found to be intact.  The intraocular lens was inserted into the capsular bag.  The irrigation/aspiration handpiece was used to remove any remaining viscoelastic.  The clear corneal wound and paracentesis wounds were then hydrated and checked with Weck-Cels to be watertight.  The lid-speculum and drape was removed, and the patient's face was cleaned with a wet and dry 4x4.  Maxitrol was instilled in the eye. A clear shield was taped over the eye. The patient was taken to the post-operative care unit in good condition, having tolerated the procedure well. ? ?Post-Op Instructions: [facility] and will receive all other orders and instructions. ? ?

## 2021-05-13 NOTE — Discharge Instructions (Signed)
Please discharge patient when stable, will follow up today with Dr. Shavana Calder at the [facility] office immediately following discharge.  Leave shield in place until visit.  All paperwork with discharge instructions will be given at the office.  [facility] Address:  [address]  [place], [place] [address]  

## 2021-05-13 NOTE — Interval H&P Note (Signed)
History and Physical Interval Note: ? ?05/13/2021 ?8:22 AM ? ?Kristin Rasmussen  has presented today for surgery, with the diagnosis of nuclear sclerosis age related cataract; right.  The various methods of treatment have been discussed with the patient and family. After consideration of risks, benefits and other options for treatment, the patient has consented to  Procedure(s) with comments: ?CATARACT EXTRACTION PHACO AND INTRAOCULAR LENS PLACEMENT (IOC) (Right) - right as a surgical intervention.  The patient's history has been reviewed, patient examined, no change in status, stable for surgery.  I have reviewed the patient's chart and labs.  Questions were answered to the patient's satisfaction.   ? ? ?Baruch Goldmann ? ? ?

## 2021-05-13 NOTE — Transfer of Care (Signed)
Immediate Anesthesia Transfer of Care Note ? ?Patient: Kristin Rasmussen ? ?Procedure(s) Performed: CATARACT EXTRACTION PHACO AND INTRAOCULAR LENS PLACEMENT (IOC) (Right: Eye) ? ?Patient Location: PACU ? ?Anesthesia Type:MAC ? ?Level of Consciousness: awake, alert , oriented and patient cooperative ? ?Airway & Oxygen Therapy: Patient Spontanous Breathing ? ?Post-op Assessment: Report given to RN, Post -op Vital signs reviewed and stable and Patient moving all extremities X 4 ? ?Post vital signs: Reviewed and stable ? ?Last Vitals:  ?Vitals Value Taken Time  ?BP    ?Temp    ?Pulse    ?Resp    ?SpO2    ? ? ?Last Pain:  ?Vitals:  ? 05/13/21 0745  ?TempSrc: Oral  ?PainSc: 0-No pain  ?   ? ?Patients Stated Pain Goal: 5 (05/13/21 0745) ? ?Complications: No notable events documented. ?

## 2021-05-13 NOTE — Anesthesia Postprocedure Evaluation (Signed)
Anesthesia Post Note ? ?Patient: Cassity Christian ? ?Procedure(s) Performed: CATARACT EXTRACTION PHACO AND INTRAOCULAR LENS PLACEMENT (IOC) (Right: Eye) ? ?Patient location during evaluation: Phase II ?Anesthesia Type: MAC ?Level of consciousness: awake and alert and oriented ?Pain management: pain level controlled ?Vital Signs Assessment: post-procedure vital signs reviewed and stable ?Respiratory status: spontaneous breathing, nonlabored ventilation and respiratory function stable ?Cardiovascular status: stable and blood pressure returned to baseline ?Postop Assessment: no apparent nausea or vomiting ?Anesthetic complications: no ? ? ?No notable events documented. ? ? ?Last Vitals:  ?Vitals:  ? 05/13/21 0745 05/13/21 0851  ?BP: 124/63 128/65  ?Pulse: 73 76  ?Resp: 18 17  ?Temp: 36.6 ?C 36.7 ?C  ?SpO2: 97% 100%  ?  ?Last Pain:  ?Vitals:  ? 05/13/21 0851  ?TempSrc: Oral  ?PainSc: 0-No pain  ? ? ?  ?  ?  ?  ?  ?  ? ?Makinzee Durley C Daimian Sudberry ? ? ? ? ?

## 2021-05-15 NOTE — Addendum Note (Signed)
Addendum  created 05/15/21 1106 by Orlie Dakin, CRNA  ? Charge Capture section accepted  ?  ?

## 2021-05-17 ENCOUNTER — Encounter (HOSPITAL_COMMUNITY): Payer: Self-pay | Admitting: Ophthalmology

## 2021-05-20 ENCOUNTER — Other Ambulatory Visit: Payer: Self-pay | Admitting: Adult Health

## 2021-05-20 ENCOUNTER — Encounter (HOSPITAL_COMMUNITY)
Admission: RE | Admit: 2021-05-20 | Discharge: 2021-05-20 | Disposition: A | Discharge status: Home / Self Care | Payer: PRIVATE HEALTH INSURANCE | Source: Ambulatory Visit | Attending: Ophthalmology | Admitting: Ophthalmology

## 2021-05-20 ENCOUNTER — Other Ambulatory Visit: Payer: Self-pay

## 2021-05-20 ENCOUNTER — Encounter (HOSPITAL_COMMUNITY): Payer: Self-pay

## 2021-05-20 DIAGNOSIS — F411 Generalized anxiety disorder: Secondary | ICD-10-CM

## 2021-05-20 DIAGNOSIS — G4700 Insomnia, unspecified: Secondary | ICD-10-CM

## 2021-05-23 NOTE — H&P (Signed)
Surgical History & Physical ? ?Patient Name: Kristin Rasmussen DOB: [birth year] ? ?Surgery: Cataract extraction with intraocular lens implant phacoemulsification; Left Eye ? ?Surgeon: Baruch Goldmann MD ?Surgery Date:  05-27-21 ?Pre-Op Date:  05-20-21 ? ?HPI: ?A 45 Yr. old female patient 1. The patient is returning after cataract post-op. The right eye is affected. Status post cataract post-op CE IOL Vivity lens, which began 1 weeks ago: Since the last visit, the affected area feels improvement. The patient's vision is still a little blurry OD. Patient is following medication instructions, the patient is using the combination post op drop TID OD. The patient has not been using artificial tears. The patient experiences no eye pain and no flashes, floater, shadow, curtain or veil. The patient does feel some dryness OD. The patient also presents for pre op OS, and presents with complaints of difficulty seeing when she is driving. The patient still can't see street signs until she is really close to that. This makes the patient very uneasy about driving. This is negatively affecting the patient's quality of life and the patient is unable to function adequately in life with the current level of vision. HPI was performed by Baruch Goldmann . ? ?Medical History: ?Glaucoma ?Cancer ? ?Review of Systems ?Negative Allergic/Immunologic ?Negative Cardiovascular ?Negative Constitutional ?Negative Ear, Nose, Mouth & Throat ?Negative Endocrine ?Negative Eyes ?Negative Gastrointestinal ?Negative Genitourinary ?Negative Hemotologic/Lymphatic ?Negative Integumentary ?Negative Musculoskeletal ?Negative Neurological ?Negative Psychiatry ?Negative Respiratory ? ?Social ?  Never smoked  ? ?Medication ?Artificial Tears, Gen teal, Prednisolone-Moxifloxacin-Bromfenac,  ?Esomeprazole Magnesium, Alprazolam, Escitalopram, Oxybutynin Chloride, Pravastatin, Estradiol, Cetirizine, Calcium citrate, Biotin, Vitamin D3, Vitamin c, Magnesium, Docusate sodium,  Baclofen, Acetaminophinen, Ibuprofen, Pseudoepjemdrine, Diphenhydramine HCl, Tussin DM, Dextroamphetamine-amphetamine, Alka-seltzer plus,  ? ?Sx/Procedures ?Phaco c IOL OD-Multi-focal,  ?Hysterectomy, Tonsilectomy, Gall bladder removal, Skin cancer melanoma,  ? ?Drug Allergies  ? NKDA ? ?History & Physical: ?Heent: Cataract, Left Eye ?NECK: supple without bruits ?LUNGS: lungs clear to auscultation ?CV: regular rate and rhythm ?Abdomen: soft and non-tender ? ?Impression & Plan: ?Assessment: ?1.  CATARACT EXTRACTION STATUS; Right Eye (Z98.41) ?2.  NUCLEAR SCLEROSIS AGE RELATED; , Left Eye (H25.12) ? ?Plan: 1.  1 week after cataract surgery. Doing well with improved vision and normal eye pressure. Call with any problems or concerns. ?Continue Pred-Moxi-Brom 2x/day for 3 more weeks. ? ?2.  Dilates well - shugarcaine by protocol. ?Vivty Toric Lens. ?Cataract accounts for the patient's decreased vision. This visual impairment is not correctable with a tolerable change in glasses or contact lenses. Cataract surgery with an implantation of a new lens should significantly improve the visual and functional status of the patient. Discussed all risks, benefits, alternatives, and potential complications. Discussed the procedures and recovery. Patient desires to have surgery. A-scan ordered and performed today for intra-ocular lens calculations. The surgery will be performed in order to improve vision for driving, reading, and for eye examinations. Recommend phacoemulsification with intra-ocular lens. Recommend Dextenza for post-operative pain and inflammation. ?Left Eye. ?Surgery required to correct imbalance of vision. ?Vivity Lens - Possible toric depending on calculations. ?

## 2021-05-27 ENCOUNTER — Encounter (HOSPITAL_COMMUNITY): Payer: Self-pay | Admitting: Ophthalmology

## 2021-05-27 ENCOUNTER — Ambulatory Visit (HOSPITAL_COMMUNITY)
Admission: RE | Admit: 2021-05-27 | Discharge: 2021-05-27 | Disposition: A | Discharge status: Home / Self Care | Payer: PRIVATE HEALTH INSURANCE | Attending: Ophthalmology | Admitting: Ophthalmology

## 2021-05-27 ENCOUNTER — Encounter (HOSPITAL_COMMUNITY)
Admission: RE | Disposition: A | Discharge status: Home / Self Care | Payer: Self-pay | Source: Home / Self Care | Attending: Ophthalmology

## 2021-05-27 ENCOUNTER — Ambulatory Visit (HOSPITAL_BASED_OUTPATIENT_CLINIC_OR_DEPARTMENT_OTHER): Payer: PRIVATE HEALTH INSURANCE | Admitting: Anesthesiology

## 2021-05-27 ENCOUNTER — Ambulatory Visit (HOSPITAL_COMMUNITY): Payer: PRIVATE HEALTH INSURANCE | Admitting: Anesthesiology

## 2021-05-27 DIAGNOSIS — F419 Anxiety disorder, unspecified: Secondary | ICD-10-CM | POA: Diagnosis not present

## 2021-05-27 DIAGNOSIS — H52202 Unspecified astigmatism, left eye: Secondary | ICD-10-CM | POA: Insufficient documentation

## 2021-05-27 DIAGNOSIS — H409 Unspecified glaucoma: Secondary | ICD-10-CM | POA: Insufficient documentation

## 2021-05-27 DIAGNOSIS — K219 Gastro-esophageal reflux disease without esophagitis: Secondary | ICD-10-CM | POA: Insufficient documentation

## 2021-05-27 DIAGNOSIS — H2512 Age-related nuclear cataract, left eye: Secondary | ICD-10-CM | POA: Insufficient documentation

## 2021-05-27 DIAGNOSIS — D649 Anemia, unspecified: Secondary | ICD-10-CM

## 2021-05-27 DIAGNOSIS — Z79899 Other long term (current) drug therapy: Secondary | ICD-10-CM | POA: Insufficient documentation

## 2021-05-27 HISTORY — PX: CATARACT EXTRACTION W/PHACO: SHX586

## 2021-05-27 SURGERY — PHACOEMULSIFICATION, CATARACT, WITH IOL INSERTION
Anesthesia: Monitor Anesthesia Care | Site: Eye | Laterality: Left

## 2021-05-27 MED ORDER — PHENYLEPHRINE HCL 2.5 % OP SOLN
1.0000 [drp] | OPHTHALMIC | Status: AC | PRN
  Administered 2021-05-27 (×3): 1 [drp] via OPHTHALMIC

## 2021-05-27 MED ORDER — EPINEPHRINE PF 1 MG/ML IJ SOLN
INTRAMUSCULAR | Status: AC
  Filled 2021-05-27: qty 2

## 2021-05-27 MED ORDER — LIDOCAINE HCL (PF) 1 % IJ SOLN
INTRAOCULAR | Status: DC | PRN
  Administered 2021-05-27: 1 mL via OPHTHALMIC

## 2021-05-27 MED ORDER — EPINEPHRINE PF 1 MG/ML IJ SOLN
INTRAOCULAR | Status: DC | PRN
  Administered 2021-05-27: 500 mL

## 2021-05-27 MED ORDER — STERILE WATER FOR IRRIGATION IR SOLN
Status: DC | PRN
  Administered 2021-05-27: 500 mL

## 2021-05-27 MED ORDER — SODIUM HYALURONATE 10 MG/ML IO SOLUTION
PREFILLED_SYRINGE | INTRAOCULAR | Status: DC | PRN
  Administered 2021-05-27: 0.85 mL via INTRAOCULAR

## 2021-05-27 MED ORDER — NEOMYCIN-POLYMYXIN-DEXAMETH 3.5-10000-0.1 OP SUSP
OPHTHALMIC | Status: DC | PRN
Start: 2021-05-27 — End: 2021-05-27
  Administered 2021-05-27: 2 [drp] via OPHTHALMIC

## 2021-05-27 MED ORDER — BSS IO SOLN
INTRAOCULAR | Status: DC | PRN
Start: 2021-05-27 — End: 2021-05-27
  Administered 2021-05-27: 15 mL via INTRAOCULAR

## 2021-05-27 MED ORDER — TROPICAMIDE 1 % OP SOLN
1.0000 [drp] | OPHTHALMIC | Status: AC | PRN
  Administered 2021-05-27 (×3): 1 [drp] via OPHTHALMIC

## 2021-05-27 MED ORDER — LIDOCAINE HCL 3.5 % OP GEL
1.0000 "application " | Freq: Once | OPHTHALMIC | Status: AC
  Administered 2021-05-27: 1 via OPHTHALMIC

## 2021-05-27 MED ORDER — POVIDONE-IODINE 5 % OP SOLN
OPHTHALMIC | Status: DC | PRN
Start: 2021-05-27 — End: 2021-05-27
  Administered 2021-05-27: 1 via OPHTHALMIC

## 2021-05-27 MED ORDER — TETRACAINE HCL 0.5 % OP SOLN
1.0000 [drp] | OPHTHALMIC | Status: AC | PRN
  Administered 2021-05-27 (×3): 1 [drp] via OPHTHALMIC

## 2021-05-27 MED ORDER — SODIUM HYALURONATE 23MG/ML IO SOSY
PREFILLED_SYRINGE | INTRAOCULAR | Status: DC | PRN
  Administered 2021-05-27: 0.6 mL via INTRAOCULAR

## 2021-05-27 SURGICAL SUPPLY — 17 items
CATARACT SUITE SIGHTPATH (MISCELLANEOUS) ×2 IMPLANT
CLOTH BEACON ORANGE TIMEOUT ST (SAFETY) ×2 IMPLANT
EYE SHIELD UNIVERSAL CLEAR (GAUZE/BANDAGES/DRESSINGS) ×1 IMPLANT
FEE CATARACT SUITE SIGHTPATH (MISCELLANEOUS) ×1 IMPLANT
GLOVE BIOGEL PI IND STRL 6.5 (GLOVE) IMPLANT
GLOVE BIOGEL PI IND STRL 7.0 (GLOVE) ×2 IMPLANT
GLOVE BIOGEL PI INDICATOR 6.5 (GLOVE) ×1
GLOVE BIOGEL PI INDICATOR 7.0 (GLOVE) ×1
LENS IOL ACRSF VT TRC 23.0 IMPLANT
LENS IOL TORIC DAT315 23.0 ×2 IMPLANT
NDL HYPO 18GX1.5 BLUNT FILL (NEEDLE) ×1 IMPLANT
NEEDLE HYPO 18GX1.5 BLUNT FILL (NEEDLE) ×2 IMPLANT
PAD ARMBOARD 7.5X6 YLW CONV (MISCELLANEOUS) ×2 IMPLANT
SYR TB 1ML LL NO SAFETY (SYRINGE) ×2 IMPLANT
TAPE SURG TRANSPORE 1 IN (GAUZE/BANDAGES/DRESSINGS) IMPLANT
TAPE SURGICAL TRANSPORE 1 IN (GAUZE/BANDAGES/DRESSINGS) ×2
WATER STERILE IRR 250ML POUR (IV SOLUTION) ×2 IMPLANT

## 2021-05-27 NOTE — Discharge Instructions (Signed)
Please discharge patient when stable, will follow up today with Dr. Walid Haig at the [facility] [place] office immediately following discharge.  Leave shield in place until visit.  All paperwork with discharge instructions will be given at the office.  [facility] Address:  [address]  [place], [place] [address]  

## 2021-05-27 NOTE — Interval H&P Note (Signed)
History and Physical Interval Note: ? ?05/27/2021 ?1:11 PM ? ?Kristin Rasmussen  has presented today for surgery, with the diagnosis of nuclear sclerosis age related cataract; left.  The various methods of treatment have been discussed with the patient and family. After consideration of risks, benefits and other options for treatment, the patient has consented to  Procedure(s) with comments: ?CATARACT EXTRACTION PHACO AND INTRAOCULAR LENS PLACEMENT (IOC) (Left) - CDE:  as a surgical intervention.  The patient's history has been reviewed, patient examined, no change in status, stable for surgery.  I have reviewed the patient's chart and labs.  Questions were answered to the patient's satisfaction.   ? ? ?Baruch Goldmann ? ? ?

## 2021-05-27 NOTE — Anesthesia Postprocedure Evaluation (Signed)
Anesthesia Post Note ? ?Patient: Kristin Rasmussen ? ?Procedure(s) Performed: CATARACT EXTRACTION PHACO AND INTRAOCULAR LENS PLACEMENT (IOC) (Left: Eye) ? ?Patient location during evaluation: Phase II ?Anesthesia Type: MAC ?Level of consciousness: awake and alert and oriented ?Pain management: pain level controlled ?Vital Signs Assessment: post-procedure vital signs reviewed and stable ?Respiratory status: spontaneous breathing, nonlabored ventilation and respiratory function stable ?Cardiovascular status: blood pressure returned to baseline and stable ?Postop Assessment: no apparent nausea or vomiting ?Anesthetic complications: no ? ? ?No notable events documented. ? ? ?Last Vitals:  ?Vitals:  ? 05/27/21 1238 05/27/21 1336  ?BP: 128/69 120/64  ?Pulse: 78 81  ?Resp: 14 14  ?Temp: 36.6 ?C 36.7 ?C  ?SpO2: 97% 99%  ?  ?Last Pain:  ?Vitals:  ? 05/27/21 1336  ?TempSrc: Oral  ?PainSc: 0-No pain  ? ? ?  ?  ?  ?  ?  ?  ? ?Fransisco Messmer C Constancia Geeting ? ? ? ? ?

## 2021-05-27 NOTE — Op Note (Signed)
Date of procedure: 05/27/21 ? ?Pre-operative diagnosis: Visually significant age-related cataract, Left Eye; Visually Significant Astigmatism, Left Eye (H25.?2) ? ?Post-operative diagnosis: Visually significant age-related cataract, Left Eye; Visually Significant Astigmatism, Left Eye ? ?Procedure: Removal of cataract via phacoemulsification and insertion of intra-ocular lens Alcon Vivity DAT315 +23.0D into the capsular bag of the Left Eye ? ?Attending surgeon: Gerda Diss. Marisa Hua, MD, MA ? ?Anesthesia: MAC, Topical [place] ? ?Complications: None ? ?Estimated Blood Loss: <40m (minimal) ? ?Specimens: None ? ?Implants: As above ? ?Indications:  Visually significant age-related cataract, Left Eye; Visually Significant Astigmatism, Left Eye ? ?Procedure:  ?The patient was seen and identified in the pre-operative area. The operative eye was identified and dilated.  The operative eye was marked.  Pre-operative toric markers were used to mark the eye at 0 and 180 degrees. Topical anesthesia was administered to the operative eye.    ? ?The patient was then to the operative suite and placed in the supine position.  A timeout was performed confirming the patient, procedure to be performed, and all other relevant information.   The patient's face was prepped and draped in the usual fashion for intra-ocular surgery.  A lid speculum was placed into the operative eye and the surgical microscope moved into place and focused.  A superotemporal paracentesis was created using a 20 gauge paracentesis blade.  Shugarcaine was injected into the anterior chamber.  Viscoelastic was injected into the anterior chamber.  A temporal clear-corneal main wound incision was created using a 2.479mmicrokeratome.  A continuous curvilinear capsulorrhexis was initiated using an irrigating cystitome and completed using capsulorrhexis forceps.  Hydrodissection and hydrodeliniation were performed.  Viscoelastic was injected into the anterior chamber.  A  phacoemulsification handpiece and a chopper as a second instrument were used to remove the nucleus and epinucleus. The irrigation/aspiration handpiece was used to remove any remaining cortical material.  ? ?The capsular bag was reinflated with viscoelastic, checked, and found to be intact.  The eye was marked to the per-op meridian.  The intraocular lens was inserted into the capsular bag and dialed into place using a Kuglen hook to 140 degrees.  The irrigation/aspiration handpiece was used to remove any remaining viscoelastic.  The clear corneal wound and paracentesis wounds were then hydrated and checked with Weck-Cels to be watertight.  The lid-speculum and drape was removed, and the patient's face was cleaned with a wet and dry 4x4.  Maxitrol was instilled in the eye before a clear shield was taped over the eye. The patient was taken to the post-operative care unit in good condition, having tolerated the procedure well. ? ?Post-Op Instructions: [facility] and will receive all other orders and instructions. ? ?

## 2021-05-27 NOTE — Transfer of Care (Signed)
Immediate Anesthesia Transfer of Care Note ? ?Patient: Kristin Rasmussen ? ?Procedure(s) Performed: CATARACT EXTRACTION PHACO AND INTRAOCULAR LENS PLACEMENT (IOC) (Left: Eye) ? ?Patient Location: PACU ? ?Anesthesia Type:MAC ? ?Level of Consciousness: awake, alert  and oriented ? ?Airway & Oxygen Therapy: Patient Spontanous Breathing ? ?Post-op Assessment: Report given to RN, Post -op Vital signs reviewed and stable, Patient moving all extremities X 4 and Patient able to stick tongue midline ? ?Post vital signs: Reviewed ? ?Last Vitals:  ?Vitals Value Taken Time  ?BP    ?Temp    ?Pulse    ?Resp    ?SpO2    ? ? ?Last Pain:  ?Vitals:  ? 05/27/21 1238  ?TempSrc: Oral  ?PainSc: 0-No pain  ?   ? ?  ? ?Complications: No notable events documented. ?

## 2021-05-27 NOTE — Anesthesia Preprocedure Evaluation (Signed)
Anesthesia Evaluation  ?Patient identified by MRN, date of birth, ID band ?Patient awake ? ? ? ?Reviewed: ?Allergy & Precautions, NPO status , Patient's Chart, lab work & pertinent test results ? ?Airway ?Mallampati: II ? ?TM Distance: >3 FB ?Neck ROM: Full ? ? ? Dental ? ?(+) Dental Advisory Given ?Crowns :   ?Pulmonary ?neg pulmonary ROS,  ?  ?Pulmonary exam normal ?breath sounds clear to auscultation ? ? ? ? ? ? Cardiovascular ?negative cardio ROS ?Normal cardiovascular exam ?Rhythm:Regular Rate:Normal ? ? ?  ?Neuro/Psych ?PSYCHIATRIC DISORDERS Anxiety negative neurological ROS ?   ? GI/Hepatic ?Neg liver ROS, GERD  Medicated and Controlled,  ?Endo/Other  ?negative endocrine ROS ? Renal/GU ?negative Renal ROS  ?negative genitourinary ?  ?Musculoskeletal ?negative musculoskeletal ROS ?(+)  ? Abdominal ?  ?Peds ?negative pediatric ROS ?(+)  Hematology ? ?(+) Blood dyscrasia, anemia ,   ?Anesthesia Other Findings ? ? Reproductive/Obstetrics ?negative OB ROS ? ?  ? ? ? ? ? ? ? ? ? ? ? ? ? ?  ?  ? ? ? ? ? ? ? ? ?Anesthesia Physical ? ?Anesthesia Plan ? ?ASA: 2 ? ?Anesthesia Plan: MAC  ? ?Post-op Pain Management: Minimal or no pain anticipated  ? ?Induction: Intravenous ? ?PONV Risk Score and Plan:  ? ?Airway Management Planned: Nasal Cannula and Natural Airway ? ?Additional Equipment:  ? ?Intra-op Plan:  ? ?Post-operative Plan:  ? ?Informed Consent: I have reviewed the patients History and Physical, chart, labs and discussed the procedure including the risks, benefits and alternatives for the proposed anesthesia with the patient or authorized representative who has indicated his/her understanding and acceptance.  ? ? ? ?Dental advisory given ? ?Plan Discussed with: CRNA and Surgeon ? ?Anesthesia Plan Comments:   ? ? ? ? ? ? ?Anesthesia Quick Evaluation ? ?

## 2021-05-28 ENCOUNTER — Encounter (HOSPITAL_COMMUNITY): Payer: Self-pay | Admitting: Ophthalmology

## 2021-06-05 ENCOUNTER — Other Ambulatory Visit: Payer: Self-pay | Admitting: Orthopedic Surgery

## 2021-06-05 DIAGNOSIS — M7531 Calcific tendinitis of right shoulder: Secondary | ICD-10-CM

## 2021-06-09 ENCOUNTER — Other Ambulatory Visit: Payer: No Typology Code available for payment source

## 2021-07-02 ENCOUNTER — Other Ambulatory Visit: Payer: Self-pay | Admitting: Adult Health

## 2021-07-02 DIAGNOSIS — F411 Generalized anxiety disorder: Secondary | ICD-10-CM

## 2021-07-02 DIAGNOSIS — G4700 Insomnia, unspecified: Secondary | ICD-10-CM

## 2021-07-02 NOTE — Telephone Encounter (Signed)
Last filled 4/19

## 2021-07-05 ENCOUNTER — Ambulatory Visit: Payer: PRIVATE HEALTH INSURANCE | Admitting: Adult Health

## 2021-07-15 ENCOUNTER — Ambulatory Visit (INDEPENDENT_AMBULATORY_CARE_PROVIDER_SITE_OTHER): Payer: No Typology Code available for payment source | Admitting: Adult Health

## 2021-07-15 ENCOUNTER — Encounter: Payer: Self-pay | Admitting: Adult Health

## 2021-07-15 DIAGNOSIS — G4700 Insomnia, unspecified: Secondary | ICD-10-CM | POA: Diagnosis not present

## 2021-07-15 DIAGNOSIS — F331 Major depressive disorder, recurrent, moderate: Secondary | ICD-10-CM

## 2021-07-15 DIAGNOSIS — F411 Generalized anxiety disorder: Secondary | ICD-10-CM | POA: Diagnosis not present

## 2021-07-15 MED ORDER — ALPRAZOLAM 0.25 MG PO TABS: 0.2500 mg | ORAL_TABLET | Freq: Three times a day (TID) | ORAL | 2 refills | Status: DC | PRN

## 2021-07-15 MED ORDER — ESCITALOPRAM OXALATE 20 MG PO TABS: 20.0000 mg | ORAL_TABLET | Freq: Every day | ORAL | 3 refills | Status: DC

## 2021-07-15 NOTE — Progress Notes (Signed)
Kristin Rasmussen [id number] 06/06/1960 61 y.o.  Subjective:   Patient ID:  Kristin Rasmussen is a 61 y.o. (DOB [birth year]) female.  Chief Complaint: No chief complaint on file.   HPI Kristin Rasmussen presents to the office today for follow-up of MDD, GAD, and insomnia.  Describes mood today as "ok". Pleasant. Mood symptoms - reports decreased depression, anxiety, and irritability. Reports some worry and rumination.  Stating "I'm doing alright". Son remarried. Reports a strained relationship with sister over her mother's estate. Feels like medications are helpful. Improved interest and motivation. Taking medications as prescribed.  Energy levels stable. Active, does not have a regular exercise routine. Working in the yard. Enjoys some usual interests and activities. Married. Lives with husband. Spending time with family.  Appetite adequate. Weight gain. Sleeps well most nights. Averages 6 hours a night. Focus and concentration stable. Completing tasks. anaging aspects of household. Retired - both she and husband. Denies SI or HI.  Denies AH or VH.     [place] Office Visit from 12/20/2020 in [place] at [facility]  Total GAD-7 Score 3      [place] Office Visit from 12/20/2020 in [place] at [facility] Visit from 12/20/2019 in [place] at [facility] Visit from 12/07/2017 in [place] Visit from 06/03/2016 in [place] Office Visit from 03/24/2016 in [place]  PHQ-2 Total Score 1 1 0 2 0  PHQ-9 Total Score 1 1 0 6 --      Flowsheet Row Admission (Discharged) from 05/27/2021 in [place] [facility] from 05/20/2021 in [place] No Risk No Risk        Review of Systems:  Review of Systems  Musculoskeletal:  Negative for gait problem.  Neurological:  Negative  for tremors.  Psychiatric/Behavioral:         Please refer to HPI    Medications: I have reviewed the patient's current medications.  Current Outpatient Medications  Medication Sig Dispense Refill   ALPRAZolam (XANAX) 0.25 MG tablet TAKE 1 TABLET BY MOUTH THREE TIMES DAILY AS NEEDED FOR ANXIETY 90 tablet 0   ALPRAZolam (XANAX) 0.5 MG tablet TAKE 1/2 (ONE-HALF) TABLET BY MOUTH THREE TIMES DAILY 45 tablet 0   amoxicillin-clavulanate (AUGMENTIN) 875-125 MG tablet Take 1 tablet by mouth 2 (two) times daily. 20 tablet 0   Ascorbic Acid (VITAMIN C) 500 MG CAPS Take by mouth.     baclofen (LIORESAL) 10 MG tablet TAKE 1 TABLET BY MOUTH THREE TIMES DAILY 30 tablet 0   benzonatate (TESSALON PERLES) 100 MG capsule 1-2 capsules up to twice daily as needed for cough 30 capsule 0   calcium citrate-vitamin D (CITRACAL+D) 315-200 MG-UNIT tablet Take 1 tablet by mouth 2 (two) times daily.     cetirizine (ZYRTEC) 10 MG tablet Take 10 mg by mouth daily.     Cholecalciferol (VITAMIN D3) 50 MCG (2000 UT) capsule Take 2,000 Units by mouth daily.     diphenhydrAMINE (BENADRYL) 25 mg capsule Take 25 mg by mouth every 6 (six) hours as needed.     docusate sodium (COLACE) 100 MG capsule Take 100 mg by mouth daily as needed for mild constipation.     DOTTI 0.1 MG/24HR patch Place onto the skin.     escitalopram (LEXAPRO) 20 MG tablet Take 1 tablet by mouth once daily 90  tablet 2   esomeprazole (NEXIUM) 40 MG capsule Take 1 capsule (40 mg total) by mouth daily. 90 capsule 2   estradiol (VIVELLE-DOT) 0.075 MG/24HR Apply 1 patch externally 2 times a week  5   fluconazole (DIFLUCAN) 150 MG tablet Take 1 tablet (150 mg total) by mouth daily. 1 tablet 0   fluorouracil (EFUDEX) 5 % cream fluorouracil 5 % topical cream  APPLY A SMALL AMOUNT TO AFFECTED AREA AS DIRECTED     Multiple Vitamin (MULTIVITAMIN) tablet Take 1 tablet by mouth daily.       nystatin cream (MYCOSTATIN) APPLY CREAM TOPICALLY TO AFFECTED AREA TWICE  DAILY     oxybutynin (DITROPAN XL) 15 MG 24 hr tablet      pravastatin (PRAVACHOL) 20 MG tablet Take 1 tablet (20 mg total) by mouth daily. 90 tablet 3   No current facility-administered medications for this visit.    Medication Side Effects: None  Allergies: No Known Allergies  Past Medical History:  Diagnosis Date   Anemia 2004   HCT 34.7   Anxiety    follows with psyc for same   Back pain    Basal cell cancer    x3; Dr Tonia Brooms   Bladder leak    Chest pain 06/2010   Costochondritis   Diverticulosis    Gallbladder problem    GERD (gastroesophageal reflux disease)    Hyperlipidemia    IBS (irritable bowel syndrome)    Melanoma ([place]) 2006   x1   PCOS (polycystic ovarian syndrome)    Plantar fasciitis 08/13/2011   Left is significant and RT is mild by [place] criteria    Spasm of esophagus    Swelling    Thrombocytopenia ([place]) 2002   133,000 platelets    Past Medical History, Surgical history, Social history, and Family history were reviewed and updated as appropriate.   Please see review of systems for further details on the patient's review from today.   Objective:   Physical Exam:  There were no vitals taken for this visit.  Physical Exam Constitutional:      General: She is not in acute distress. Musculoskeletal:        General: No deformity.  Neurological:     Mental Status: She is alert and oriented to person, place, and time.     Coordination: Coordination normal.  Psychiatric:        Attention and Perception: Attention and perception normal. She does not perceive auditory or visual hallucinations.        Mood and Affect: Mood normal. Mood is not anxious or depressed. Affect is not labile, blunt, angry or inappropriate.        Speech: Speech normal.        Behavior: Behavior normal.        Thought Content: Thought content normal. Thought content is not paranoid or delusional. Thought content does not include homicidal or suicidal ideation. Thought content  does not include homicidal or suicidal plan.        Cognition and Memory: Cognition and memory normal.        Judgment: Judgment normal.     Comments: Insight intact     Lab Review:     Component Value Date/Time   NA 137 12/20/2020 1031   NA 140 06/02/2017 1124   K 4.0 12/20/2020 1031   CL 99 12/20/2020 1031   CO2 33 (H) 12/20/2020 1031   GLUCOSE 91 12/20/2020 1031   BUN 9 12/20/2020 1031   BUN  13 06/02/2017 1124   CREATININE 0.55 12/20/2020 1031   CALCIUM 9.1 12/20/2020 1031   PROT 6.9 12/20/2020 1031   PROT 6.6 06/02/2017 1124   ALBUMIN 4.2 12/20/2020 1031   ALBUMIN 4.4 06/02/2017 1124   AST 15 12/20/2020 1031   ALT 17 12/20/2020 1031   ALKPHOS 44 12/20/2020 1031   BILITOT 0.4 12/20/2020 1031   BILITOT 0.2 06/02/2017 1124   GFRNONAA 109 06/02/2017 1124   GFRAA 125 06/02/2017 1124       Component Value Date/Time   WBC 7.4 12/20/2020 1031   RBC 4.98 12/20/2020 1031   HGB 15.0 12/20/2020 1031   HGB 14.6 06/03/2016 1253   HGB 14.2 03/29/2010 1506   HCT 44.4 12/20/2020 1031   HCT 43.3 06/03/2016 1253   HCT 41.3 03/29/2010 1506   PLT 190.0 12/20/2020 1031   PLT 221 03/29/2010 1506   MCV 89.1 12/20/2020 1031   MCV 88 06/03/2016 1253   MCV 88.4 03/29/2010 1506   MCH 29.6 06/03/2016 1253   MCH 29.8 06/28/2010 0355   MCHC 33.7 12/20/2020 1031   RDW 14.2 12/20/2020 1031   RDW 14.5 06/03/2016 1253   RDW 13.6 03/29/2010 1506   LYMPHSABS 1.7 12/20/2020 1031   LYMPHSABS 1.5 06/03/2016 1253   LYMPHSABS 1.6 03/29/2010 1506   MONOABS 0.4 12/20/2020 1031   MONOABS 0.4 03/29/2010 1506   EOSABS 0.2 12/20/2020 1031   EOSABS 0.2 06/03/2016 1253   BASOSABS 0.0 12/20/2020 1031   BASOSABS 0.0 06/03/2016 1253   BASOSABS 0.1 03/29/2010 1506    No results found for: "POCLITH", "LITHIUM"   No results found for: "PHENYTOIN", "PHENOBARB", "VALPROATE", "CBMZ"   .res Assessment: Plan:    Plan:  1. Lexapro '20mg'$  daily 2. Xanax 0.'25mg'$  TID  RTC 1 year  Patient advised to  contact office with any questions, adverse effects, or acute worsening in signs and symptoms.  Discussed potential benefits, risk, and side effects of benzodiazepines to include potential risk of tolerance and dependence, as well as possible drowsiness. Advised patient not to drive if experiencing drowsiness and to take lowest possible effective dose to minimize risk of dependence and tolerance.  There are no diagnoses linked to this encounter.   Please see After Visit Summary for patient specific instructions.  Future Appointments  Date Time Provider [place]  12/23/2021  9:10 AM Binnie Rail, MD [facility] None    No orders of the defined types were placed in this encounter.   -------------------------------

## 2021-07-26 ENCOUNTER — Encounter: Payer: Self-pay | Admitting: Internal Medicine

## 2021-07-29 ENCOUNTER — Telehealth: Payer: Self-pay | Admitting: Internal Medicine

## 2021-07-29 ENCOUNTER — Other Ambulatory Visit: Payer: Self-pay | Admitting: Internal Medicine

## 2021-07-30 MED ORDER — BACLOFEN 10 MG PO TABS: 10.0000 mg | ORAL_TABLET | Freq: Three times a day (TID) | ORAL | 3 refills | Status: DC

## 2021-10-09 ENCOUNTER — Other Ambulatory Visit: Payer: Self-pay | Admitting: Orthopedic Surgery

## 2021-10-09 ENCOUNTER — Ambulatory Visit: Payer: Self-pay | Admitting: Orthopedic Surgery

## 2021-10-14 ENCOUNTER — Encounter (HOSPITAL_BASED_OUTPATIENT_CLINIC_OR_DEPARTMENT_OTHER): Payer: Self-pay | Admitting: Orthopedic Surgery

## 2021-10-15 NOTE — H&P (Signed)
PREOPERATIVE H&P  Chief Complaint: right foot mass  HPI: Kristin Rasmussen is a 61 y.o. female who presents for preoperative history and physical with a diagnosis of right foot mass. Symptoms have been there since January.  We have tried an injection.  Her pain some but she would like this removed. She has elected for surgical management.   Past Medical History:  Diagnosis Date   Anemia 2004   HCT 34.7   Anxiety    follows with psyc for same   Back pain    Basal cell cancer    x3; Dr Tonia Brooms   Bladder leak    Chest pain 06/2010   Costochondritis   Diverticulosis    Gallbladder problem    GERD (gastroesophageal reflux disease)    Hyperlipidemia    IBS (irritable bowel syndrome)    Melanoma ([place]) 2006   x1   PCOS (polycystic ovarian syndrome)    Plantar fasciitis 08/13/2011   Left is significant and RT is mild by [place] criteria    Spasm of esophagus    Swelling    Thrombocytopenia ([place]) 2002   133,000 platelets   Past Surgical History:  Procedure Laterality Date   CATARACT EXTRACTION W/PHACO Right 05/13/2021   Procedure: CATARACT EXTRACTION PHACO AND INTRAOCULAR LENS PLACEMENT ([place]);  Surgeon: Baruch Goldmann, MD;  Location: [facility];  Service: Ophthalmology;  Laterality: Right;  CDE: 8.14    CATARACT EXTRACTION W/PHACO Left 05/27/2021   Procedure: CATARACT EXTRACTION PHACO AND INTRAOCULAR LENS PLACEMENT (IOC);  Surgeon: Baruch Goldmann, MD;  Location: [facility];  Service: Ophthalmology;  Laterality: Left;  CDE: 6.55   CHOLECYSTECTOMY  2002   [facility]   COLONOSCOPY     MELANOMA EXCISION     LUE 2006  [facility]; Derm : Dr Crista Luria   SHOULDER SURGERY     Left   TONSILLECTOMY     TOTAL ABDOMINAL HYSTERECTOMY  1991   abnormal PAP hx   Social History   Socioeconomic History   Marital status: Married    Spouse name: Not on file   Number of children: 3   Years of education: 12   Highest education level: Not on file  Occupational History   Occupation: Counsellor: [employer]  Tobacco Use   Smoking status: Never   Smokeless tobacco: Never  Substance and Sexual Activity   Alcohol use: Yes    Comment: rarely, only holiday's   Drug use: No   Sexual activity: Not on file  Other Topics Concern   Not on file  Social History Narrative   Does some yard work and walking for exercise - couple of days a week   Social Determinants of Radio broadcast assistant Strain: Not on file  Food Insecurity: Not on file  Transportation Needs: Not on file  Physical Activity: Not on file  Stress: Not on file  Social Connections: Not on file   Family History  Problem Relation Age of Onset   Diabetes Mother    Hypertension Mother    Obesity Mother    Hypertension Father    Colon polyps Father    GER disease Father    Skin cancer Father    Ovarian cancer Paternal Aunt    Heart attack Maternal Grandmother 76   Diabetes Maternal Grandmother    Heart attack Maternal Grandfather 74   Stroke Maternal Grandfather 65   Colon cancer Paternal Grandfather        unsure age  No Known Allergies Prior to Admission medications   Medication Sig Start Date End Date Taking? Authorizing Provider  ALPRAZolam (XANAX) 0.25 MG tablet Take 1 tablet (0.25 mg total) by mouth 3 (three) times daily as needed. for anxiety 07/15/21  Yes Mozingo, Berdie Ogren, NP  baclofen (LIORESAL) 10 MG tablet Take 1 tablet (10 mg total) by mouth 3 (three) times daily. 07/30/21  Yes Burns, Claudina Lick, MD  calcium citrate-vitamin D (CITRACAL+D) 315-200 MG-UNIT tablet Take 1 tablet by mouth 2 (two) times daily.   Yes [provider]  cetirizine (ZYRTEC) 10 MG tablet Take 10 mg by mouth daily.   Yes [provider]  Cholecalciferol (VITAMIN D3) 50 MCG (2000 UT) capsule Take 2,000 Units by mouth daily.   Yes [provider]  docusate sodium (COLACE) 100 MG capsule Take 100 mg by mouth daily as needed for mild constipation.   Yes [provider]  escitalopram  (LEXAPRO) 20 MG tablet Take 1 tablet (20 mg total) by mouth daily. 07/15/21  Yes Mozingo, Berdie Ogren, NP  esomeprazole (NEXIUM) 40 MG capsule Take 1 capsule (40 mg total) by mouth daily. 12/20/20  Yes Burns, Claudina Lick, MD  estradiol (VIVELLE-DOT) 0.075 MG/24HR Apply 1 patch externally 2 times a week 11/27/15  Yes [provider]  Multiple Vitamin (MULTIVITAMIN) tablet Take 1 tablet by mouth daily.     Yes [provider]  oxybutynin (DITROPAN XL) 15 MG 24 hr tablet  10/24/18  Yes [provider]  pravastatin (PRAVACHOL) 20 MG tablet Take 1 tablet (20 mg total) by mouth daily. 12/20/20  Yes Burns, Claudina Lick, MD  Ascorbic Acid (VITAMIN C) 500 MG CAPS Take by mouth.    [provider]  diphenhydrAMINE (BENADRYL) 25 mg capsule Take 25 mg by mouth every 6 (six) hours as needed.    [provider]  DOTTI 0.1 MG/24HR patch Place onto the skin. 12/05/19   [provider]  fluconazole (DIFLUCAN) 150 MG tablet Take 1 tablet (150 mg total) by mouth daily. 01/10/21   Lucretia Kern, DO  fluorouracil (EFUDEX) 5 % cream fluorouracil 5 % topical cream  APPLY A SMALL AMOUNT TO AFFECTED AREA AS DIRECTED    [provider]  nystatin cream (MYCOSTATIN) APPLY CREAM TOPICALLY TO AFFECTED AREA TWICE DAILY 11/22/18   [provider]     Positive ROS: All other systems have been reviewed and were otherwise negative with the exception of those mentioned in the HPI and as above.  Physical Exam: General: Alert, no acute distress Cardiovascular: No pedal edema Respiratory: No cyanosis, no use of accessory musculature GI: No organomegaly, abdomen is soft and non-tender Skin: No lesions in the area of chief complaint Neurologic: Sensation intact distally Psychiatric: Patient is competent for consent with normal mood and affect Lymphatic: No axillary or cervical lymphadenopathy  MUSCULOSKELETAL: On exam she still has a slightly palpable dorsal lateral  foot mass.    Imaging: MRI demonstrates evidence for a lipoma that has some mild atypical features measured 3.1 x 1.3 x 5.1 centimeters.  There is an additional prominence of fatty tissue around the talar neck that measures 3 x 1 x 2.3 centimeters without any atypical features.    Assessment: Right foot mass  Plan: Plan for Procedure(s): EXCISION MASS, RIGHT FOOT  The risks benefits and alternatives were discussed with the patient including but not limited to the risks of nonoperative treatment, versus surgical intervention including infection, bleeding, nerve injury,  blood clots, cardiopulmonary complications,  morbidity, mortality, among others, and they were willing to proceed.      Ventura Bruns, PA-C   10/15/2021 1:57 PM

## 2021-10-22 ENCOUNTER — Ambulatory Visit (HOSPITAL_BASED_OUTPATIENT_CLINIC_OR_DEPARTMENT_OTHER): Payer: PRIVATE HEALTH INSURANCE | Admitting: Anesthesiology

## 2021-10-22 ENCOUNTER — Encounter (HOSPITAL_BASED_OUTPATIENT_CLINIC_OR_DEPARTMENT_OTHER): Payer: Self-pay | Admitting: Orthopedic Surgery

## 2021-10-22 ENCOUNTER — Ambulatory Visit (HOSPITAL_BASED_OUTPATIENT_CLINIC_OR_DEPARTMENT_OTHER)
Admission: RE | Admit: 2021-10-22 | Discharge: 2021-10-22 | Disposition: A | Discharge status: Home / Self Care | Payer: PRIVATE HEALTH INSURANCE | Attending: Orthopedic Surgery | Admitting: Orthopedic Surgery

## 2021-10-22 ENCOUNTER — Other Ambulatory Visit: Payer: Self-pay

## 2021-10-22 ENCOUNTER — Encounter (HOSPITAL_BASED_OUTPATIENT_CLINIC_OR_DEPARTMENT_OTHER)
Admission: RE | Disposition: A | Discharge status: Home / Self Care | Payer: Self-pay | Source: Home / Self Care | Attending: Orthopedic Surgery

## 2021-10-22 DIAGNOSIS — K219 Gastro-esophageal reflux disease without esophagitis: Secondary | ICD-10-CM | POA: Diagnosis not present

## 2021-10-22 DIAGNOSIS — D1723 Benign lipomatous neoplasm of skin and subcutaneous tissue of right leg: Secondary | ICD-10-CM | POA: Diagnosis not present

## 2021-10-22 DIAGNOSIS — D1739 Benign lipomatous neoplasm of skin and subcutaneous tissue of other sites: Secondary | ICD-10-CM | POA: Insufficient documentation

## 2021-10-22 DIAGNOSIS — Z6837 Body mass index (BMI) 37.0-37.9, adult: Secondary | ICD-10-CM

## 2021-10-22 HISTORY — PX: MASS EXCISION: SHX2000

## 2021-10-22 SURGERY — EXCISION MASS
Anesthesia: General | Site: Foot | Laterality: Right

## 2021-10-22 MED ORDER — CEFAZOLIN SODIUM-DEXTROSE 2-4 GM/100ML-% IV SOLN
INTRAVENOUS | Status: AC
  Filled 2021-10-22: qty 100

## 2021-10-22 MED ORDER — LACTATED RINGERS IV SOLN: INTRAVENOUS | Status: DC

## 2021-10-22 MED ORDER — OXYCODONE HCL 5 MG PO TABS: 5.0000 mg | ORAL_TABLET | Freq: Once | ORAL | Status: DC | PRN

## 2021-10-22 MED ORDER — CEFAZOLIN SODIUM-DEXTROSE 2-4 GM/100ML-% IV SOLN: 2.0000 g | INTRAVENOUS | Status: DC

## 2021-10-22 MED ORDER — MIDAZOLAM HCL 2 MG/2ML IJ SOLN
INTRAMUSCULAR | Status: AC
  Filled 2021-10-22: qty 2

## 2021-10-22 MED ORDER — DEXAMETHASONE SODIUM PHOSPHATE 10 MG/ML IJ SOLN
INTRAMUSCULAR | Status: DC | PRN
  Administered 2021-10-22: 5 mg via INTRAVENOUS

## 2021-10-22 MED ORDER — DEXAMETHASONE SODIUM PHOSPHATE 10 MG/ML IJ SOLN
INTRAMUSCULAR | Status: AC
  Filled 2021-10-22: qty 1

## 2021-10-22 MED ORDER — BUPIVACAINE HCL (PF) 0.25 % IJ SOLN
INTRAMUSCULAR | Status: AC
  Filled 2021-10-22: qty 90

## 2021-10-22 MED ORDER — PROPOFOL 10 MG/ML IV BOLUS
INTRAVENOUS | Status: AC
  Filled 2021-10-22: qty 20

## 2021-10-22 MED ORDER — OXYCODONE HCL 5 MG/5ML PO SOLN: 5.0000 mg | Freq: Once | ORAL | Status: DC | PRN

## 2021-10-22 MED ORDER — LIDOCAINE 2% (20 MG/ML) 5 ML SYRINGE
INTRAMUSCULAR | Status: AC
  Filled 2021-10-22: qty 5

## 2021-10-22 MED ORDER — FENTANYL CITRATE (PF) 100 MCG/2ML IJ SOLN: 25.0000 ug | INTRAMUSCULAR | Status: DC | PRN

## 2021-10-22 MED ORDER — ACETAMINOPHEN 500 MG PO TABS
1000.0000 mg | ORAL_TABLET | Freq: Once | ORAL | Status: AC
  Administered 2021-10-22: 1000 mg via ORAL

## 2021-10-22 MED ORDER — KETOROLAC TROMETHAMINE 30 MG/ML IJ SOLN: 30.0000 mg | Freq: Once | INTRAMUSCULAR | Status: DC | PRN

## 2021-10-22 MED ORDER — CEFAZOLIN SODIUM-DEXTROSE 2-3 GM-%(50ML) IV SOLR
INTRAVENOUS | Status: DC | PRN
  Administered 2021-10-22: 2 g via INTRAVENOUS

## 2021-10-22 MED ORDER — ONDANSETRON HCL 4 MG/2ML IJ SOLN
INTRAMUSCULAR | Status: AC
  Filled 2021-10-22: qty 2

## 2021-10-22 MED ORDER — 0.9 % SODIUM CHLORIDE (POUR BTL) OPTIME
TOPICAL | Status: DC | PRN
  Administered 2021-10-22: 50 mL

## 2021-10-22 MED ORDER — MIDAZOLAM HCL 2 MG/2ML IJ SOLN
INTRAMUSCULAR | Status: DC | PRN
  Administered 2021-10-22: 2 mg via INTRAVENOUS

## 2021-10-22 MED ORDER — ACETAMINOPHEN 500 MG PO TABS
ORAL_TABLET | ORAL | Status: AC
  Filled 2021-10-22: qty 2

## 2021-10-22 MED ORDER — ONDANSETRON HCL 4 MG/2ML IJ SOLN: 4.0000 mg | Freq: Once | INTRAMUSCULAR | Status: DC | PRN

## 2021-10-22 MED ORDER — HYDROCODONE-ACETAMINOPHEN 5-325 MG PO TABS: 1.0000 | ORAL_TABLET | ORAL | 0 refills | Status: DC | PRN

## 2021-10-22 MED ORDER — PROPOFOL 10 MG/ML IV BOLUS
INTRAVENOUS | Status: DC | PRN
  Administered 2021-10-22: 200 mg via INTRAVENOUS

## 2021-10-22 MED ORDER — FENTANYL CITRATE (PF) 100 MCG/2ML IJ SOLN
INTRAMUSCULAR | Status: AC
  Filled 2021-10-22: qty 2

## 2021-10-22 MED ORDER — FENTANYL CITRATE (PF) 100 MCG/2ML IJ SOLN
INTRAMUSCULAR | Status: DC | PRN
  Administered 2021-10-22: 50 ug via INTRAVENOUS

## 2021-10-22 MED ORDER — LIDOCAINE HCL (CARDIAC) PF 100 MG/5ML IV SOSY
PREFILLED_SYRINGE | INTRAVENOUS | Status: DC | PRN
  Administered 2021-10-22: 100 mg via INTRAVENOUS

## 2021-10-22 MED ORDER — BUPIVACAINE HCL (PF) 0.25 % IJ SOLN
INTRAMUSCULAR | Status: DC | PRN
  Administered 2021-10-22: 10 mL

## 2021-10-22 MED ORDER — ONDANSETRON HCL 4 MG/2ML IJ SOLN
INTRAMUSCULAR | Status: DC | PRN
  Administered 2021-10-22: 4 mg via INTRAVENOUS

## 2021-10-22 SURGICAL SUPPLY — 43 items
BANDAGE ESMARK 6X9 LF (GAUZE/BANDAGES/DRESSINGS) IMPLANT
BLADE SURG 15 STRL LF DISP TIS (BLADE) ×1 IMPLANT
BLADE SURG 15 STRL SS (BLADE) ×1
BNDG CMPR 9X4 STRL LF SNTH (GAUZE/BANDAGES/DRESSINGS)
BNDG CMPR 9X6 STRL LF SNTH (GAUZE/BANDAGES/DRESSINGS) ×1
BNDG ELASTIC 3X5.8 VLCR STR LF (GAUZE/BANDAGES/DRESSINGS) ×1 IMPLANT
BNDG ESMARK 4X9 LF (GAUZE/BANDAGES/DRESSINGS) IMPLANT
BNDG ESMARK 6X9 LF (GAUZE/BANDAGES/DRESSINGS) ×1
CORD BIPOLAR FORCEPS 12FT (ELECTRODE) ×1 IMPLANT
COVER BACK TABLE 60X90IN (DRAPES) ×1 IMPLANT
CUFF TOURN SGL QUICK 18X4 (TOURNIQUET CUFF) IMPLANT
CUFF TOURN SGL QUICK 30 NS (TOURNIQUET CUFF) IMPLANT
CUFF TOURN SGL QUICK 34 (TOURNIQUET CUFF)
CUFF TRNQT CYL 34X4.125X (TOURNIQUET CUFF) IMPLANT
DRAPE EXTREMITY T 121X128X90 (DISPOSABLE) ×1 IMPLANT
DRAPE IMP U-DRAPE 54X76 (DRAPES) ×1 IMPLANT
DRAPE SURG 17X23 STRL (DRAPES) ×1 IMPLANT
DURAPREP 26ML APPLICATOR (WOUND CARE) ×1 IMPLANT
GAUZE SPONGE 4X4 12PLY STRL (GAUZE/BANDAGES/DRESSINGS) ×1 IMPLANT
GAUZE XEROFORM 1X8 LF (GAUZE/BANDAGES/DRESSINGS) IMPLANT
GLOVE BIO SURGEON STRL SZ7 (GLOVE) ×1 IMPLANT
GLOVE BIO SURGEON STRL SZ7.5 (GLOVE) ×1 IMPLANT
GLOVE BIOGEL PI IND STRL 7.0 (GLOVE) ×1 IMPLANT
GLOVE BIOGEL PI IND STRL 8 (GLOVE) ×2 IMPLANT
GLOVE ORTHO TXT STRL SZ7.5 (GLOVE) ×1 IMPLANT
GLOVE SURG ORTHO 8.0 STRL STRW (GLOVE) ×1 IMPLANT
GOWN STRL REUS W/ TWL LRG LVL3 (GOWN DISPOSABLE) ×1 IMPLANT
GOWN STRL REUS W/ TWL XL LVL3 (GOWN DISPOSABLE) ×2 IMPLANT
GOWN STRL REUS W/TWL LRG LVL3 (GOWN DISPOSABLE) ×1
GOWN STRL REUS W/TWL XL LVL3 (GOWN DISPOSABLE) ×2
NDL HYPO 25X1 1.5 SAFETY (NEEDLE) ×1 IMPLANT
NEEDLE HYPO 25X1 1.5 SAFETY (NEEDLE) ×1 IMPLANT
NS IRRIG 1000ML POUR BTL (IV SOLUTION) ×1 IMPLANT
PACK BASIN DAY SURGERY FS (CUSTOM PROCEDURE TRAY) ×1 IMPLANT
PAD CAST 3X4 CTTN HI CHSV (CAST SUPPLIES) ×1 IMPLANT
PADDING CAST ABS COTTON 3X4 (CAST SUPPLIES) ×1 IMPLANT
PADDING CAST ABS COTTON 4X4 ST (CAST SUPPLIES) ×1 IMPLANT
PADDING CAST COTTON 3X4 STRL (CAST SUPPLIES) ×1
SUT ETHILON 4 0 PS 2 18 (SUTURE) ×1 IMPLANT
SYR BULB EAR ULCER 3OZ GRN STR (SYRINGE) ×1 IMPLANT
SYR CONTROL 10ML LL (SYRINGE) ×1 IMPLANT
TOWEL GREEN STERILE FF (TOWEL DISPOSABLE) ×1 IMPLANT
UNDERPAD 30X36 HEAVY ABSORB (UNDERPADS AND DIAPERS) ×1 IMPLANT

## 2021-10-22 NOTE — Anesthesia Postprocedure Evaluation (Signed)
Anesthesia Post Note  Patient: Kristin Rasmussen  Procedure(s) Performed: EXCISION MASS, RIGHT FOOT (Right: Foot)     Patient location during evaluation: PACU Anesthesia Type: General Level of consciousness: awake and alert Pain management: pain level controlled Vital Signs Assessment: post-procedure vital signs reviewed and stable Respiratory status: spontaneous breathing, nonlabored ventilation, respiratory function stable and patient connected to nasal cannula oxygen Cardiovascular status: blood pressure returned to baseline and stable Postop Assessment: no apparent nausea or vomiting Anesthetic complications: no   No notable events documented.  Last Vitals:  Vitals:   10/22/21 0905 10/22/21 0915  BP: (!) 141/66 117/84  Pulse: 98 90  Resp: 17 17  Temp: 36.5 C   SpO2: 97% 98%    Last Pain:  Vitals:   10/22/21 0905  TempSrc:   PainSc: 0-No pain        RLE Motor Response: Purposeful movement (10/22/21 0930)        Felisha Claytor S

## 2021-10-22 NOTE — Op Note (Signed)
10/22/2021  8:54 AM  PATIENT:  Kristin Rasmussen    PRE-OPERATIVE DIAGNOSIS: Right dorsal lateral foot mass, atypical lipoma  POST-OPERATIVE DIAGNOSIS:  Same  PROCEDURE: Excision of right dorsal lateral foot mass, deep, 3 x 3 cm x 2 cm  SURGEON:  Johnny Bridge, MD  PHYSICIAN ASSISTANT: Merlene Pulling, PA-C, present and scrubbed throughout the case, critical for completion in a timely fashion, and for retraction, instrumentation, and closure.  ANESTHESIA:   General  PREOPERATIVE INDICATIONS:  Kristin Rasmussen is a  61 y.o. female with a diagnosis of RIGHT FOOT MASS who failed conservative measures and elected for surgical management.  There is some slight atypical features on preoperative MRI.  She also had a simple lipoma over the talar neck which was not palpable or symptomatic.  She also had some bone marrow edema that was at the base of the metatarsals, and unlikely related.  She elected for surgical excision.  The risks benefits and alternatives were discussed with the patient preoperatively including but not limited to the risks of infection, bleeding, nerve injury, cardiopulmonary complications, the need for revision surgery, among others, and the patient was willing to proceed.  We also discussed the risks that this could be some other atypical cancerous lesion, and could require further debridement or excision.  ESTIMATED BLOOD LOSS: Minimal  OPERATIVE IMPLANTS: None  OPERATIVE FINDINGS: 3 x 3 cm lipoma, that was moderately well organized, but fairly vascular.  OPERATIVE PROCEDURE: The patient was brought to the operating room placed in supine position.  General anesthesia was administered.  The right lower extremity was prepped and draped in usual sterile fashion.  Timeout performed.  Incision was made in line with the mass longitudinally and then the mass was dissected, and excised completely down to the layer of the superficial muscular fascia.  A bipolar cautery was used to remove the  blood vessels feeding the mass.  I then excised the mass is entirely as possible, and then sent for specimen.  The wounds were irrigated copiously, the tourniquet released, excellent hemostasis obtained, the wounds were injected and closed with nylon.  She was awakened and returned to the PACU in stable and satisfactory condition.  There were no complications and she tolerated the procedure well.

## 2021-10-22 NOTE — Discharge Instructions (Addendum)
Post Anesthesia Home Care Instructions  Activity: Get plenty of rest for the remainder of the day. A responsible individual must stay with you for 24 hours following the procedure.  For the next 24 hours, DO NOT: -Drive a car -Paediatric nurse -Drink alcoholic beverages -Take any medication unless instructed by your physician -Make any legal decisions or sign important papers.  Meals: Start with liquid foods such as gelatin or soup. Progress to regular foods as tolerated. Avoid greasy, spicy, heavy foods. If nausea and/or vomiting occur, drink only clear liquids until the nausea and/or vomiting subsides. Call your physician if vomiting continues.  Special Instructions/Symptoms: Your throat may feel dry or sore from the anesthesia or the breathing tube placed in your throat during surgery. If this causes discomfort, gargle with warm salt water. The discomfort should disappear within 24 hours.  If you had a scopolamine patch placed behind your ear for the management of post- operative nausea and/or vomiting:  1. The medication in the patch is effective for 72 hours, after which it should be removed.  Wrap patch in a tissue and discard in the trash. Wash hands thoroughly with soap and water. 2. You may remove the patch earlier than 72 hours if you experience unpleasant side effects which may include dry mouth, dizziness or visual disturbances. 3. Avoid touching the patch. Wash your hands with soap and water after contact with the patch.    Next tylenol dose after 12:43pm.   Diet: As you were doing prior to hospitalization   Shower:  May shower but keep the wounds dry, use an occlusive plastic wrap, NO SOAKING IN TUB.  If the bandage gets wet, change with a clean dry gauze.    Dressing:  You may change your dressing 3-5 days after surgery. f you had hand or foot surgery, we will plan to remove your stitches in about 2 weeks in the office.    Activity:  Increase activity slowly as  tolerated, but follow the weight bearing instructions below.  The rules on driving is that you can not be taking narcotics while you drive, and you must feel in control of the vehicle.    Weight Bearing:   Ok to weight bear as tolerated on right foot. Use hard sole shoe given to you after surgery until it feels comfortable to wear a normal shoe.   To prevent constipation: you may use a stool softener such as -  Colace (over the counter) 100 mg by mouth twice a day  Drink plenty of fluids (prune juice may be helpful) and high fiber foods Miralax (over the counter) for constipation as needed.    Itching:  If you experience itching with your medications, try taking only a single pain pill, or even half a pain pill at a time.  You may take up to 10 pain pills per day, and you can also use benadryl over the counter for itching or also to help with sleep.   Precautions:  If you experience chest pain or shortness of breath - call 911 immediately for transfer to the hospital emergency department!!  If you develop a fever greater that 101 F, purulent drainage from wound, increased redness or drainage from wound, or calf pain -- Call the office at [phone]  Follow- Up Appointment:  Please call for an appointment to be seen in 2 weeks [place] - [phone]

## 2021-10-22 NOTE — Anesthesia Preprocedure Evaluation (Signed)
Anesthesia Evaluation  Patient identified by MRN, date of birth, ID band Patient awake    Reviewed: Allergy & Precautions, NPO status , Patient's Chart, lab work & pertinent test results  Airway Mallampati: II  TM Distance: >3 FB Neck ROM: Full    Dental no notable dental hx.    Pulmonary neg pulmonary ROS,    Pulmonary exam normal breath sounds clear to auscultation       Cardiovascular negative cardio ROS Normal cardiovascular exam Rhythm:Regular Rate:Normal     Neuro/Psych negative neurological ROS  negative psych ROS   GI/Hepatic Neg liver ROS, GERD  Medicated,  Endo/Other  negative endocrine ROS  Renal/GU negative Renal ROS  negative genitourinary   Musculoskeletal negative musculoskeletal ROS (+)   Abdominal   Peds negative pediatric ROS (+)  Hematology negative hematology ROS (+)   Anesthesia Other Findings   Reproductive/Obstetrics negative OB ROS                             Anesthesia Physical Anesthesia Plan  ASA: 2  Anesthesia Plan: General   Post-op Pain Management:    Induction: Intravenous  PONV Risk Score and Plan: 3 and Ondansetron, Dexamethasone and Treatment may vary due to age or medical condition  Airway Management Planned: LMA  Additional Equipment:   Intra-op Plan:   Post-operative Plan: Extubation in OR  Informed Consent: I have reviewed the patients History and Physical, chart, labs and discussed the procedure including the risks, benefits and alternatives for the proposed anesthesia with the patient or authorized representative who has indicated his/her understanding and acceptance.     Dental advisory given  Plan Discussed with: CRNA and Surgeon  Anesthesia Plan Comments:         Anesthesia Quick Evaluation

## 2021-10-22 NOTE — Interval H&P Note (Signed)
History and Physical Interval Note:  10/22/2021 8:06 AM  Kristin Rasmussen  has presented today for surgery, with the diagnosis of RIGHT FOOT MASS.  The various methods of treatment have been discussed with the patient and family. After consideration of risks, benefits and other options for treatment, the patient has consented to  Procedure(s): EXCISION MASS, RIGHT FOOT (Right) as a surgical intervention.  The patient's history has been reviewed, patient examined, no change in status, stable for surgery.  I have reviewed the patient's chart and labs.  Questions were answered to the patient's satisfaction.     Johnny Bridge

## 2021-10-22 NOTE — Transfer of Care (Signed)
Immediate Anesthesia Transfer of Care Note  Patient: Kristin Rasmussen  Procedure(s) Performed: EXCISION MASS, RIGHT FOOT (Right: Foot)  Patient Location: PACU  Anesthesia Type:General  Level of Consciousness: awake, alert  and patient cooperative  Airway & Oxygen Therapy: Patient Spontanous Breathing and Patient connected to face mask oxygen  Post-op Assessment: Report given to RN and Post -op Vital signs reviewed and stable  Post vital signs: Reviewed and stable  Last Vitals:  Vitals Value Taken Time  BP    Temp    Pulse 101 10/22/21 0906  Resp 19 10/22/21 0906  SpO2 97 % 10/22/21 0906  Vitals shown include unvalidated device data.  Last Pain:  Vitals:   10/22/21 0636  TempSrc: Oral  PainSc: 0-No pain         Complications: No notable events documented.

## 2021-10-22 NOTE — Anesthesia Procedure Notes (Signed)
Procedure Name: LMA Insertion Date/Time: 10/22/2021 8:23 AM  Performed by: Verita Lamb, CRNAPre-anesthesia Checklist: Patient identified, Emergency Drugs available, Suction available and Patient being monitored Patient Re-evaluated:Patient Re-evaluated prior to induction Oxygen Delivery Method: Circle system utilized Preoxygenation: Pre-oxygenation with 100% oxygen Induction Type: IV induction Ventilation: Mask ventilation without difficulty LMA: LMA inserted LMA Size: 4.0 Number of attempts: 1 Airway Equipment and Method: Bite block Placement Confirmation: positive ETCO2, CO2 detector and breath sounds checked- equal and bilateral Tube secured with: Tape Dental Injury: Teeth and Oropharynx as per pre-operative assessment

## 2021-10-23 ENCOUNTER — Encounter (HOSPITAL_BASED_OUTPATIENT_CLINIC_OR_DEPARTMENT_OTHER): Payer: Self-pay | Admitting: Orthopedic Surgery

## 2021-10-23 LAB — SURGICAL PATHOLOGY

## 2021-10-23 NOTE — Progress Notes (Signed)
Left message stating courtesy call and if any questions or concerns please call the doctors office.  

## 2021-11-22 ENCOUNTER — Telehealth: Payer: Self-pay | Admitting: Internal Medicine

## 2021-11-22 ENCOUNTER — Other Ambulatory Visit: Payer: Self-pay | Admitting: Adult Health

## 2021-11-22 DIAGNOSIS — R7303 Prediabetes: Secondary | ICD-10-CM

## 2021-11-22 DIAGNOSIS — F411 Generalized anxiety disorder: Secondary | ICD-10-CM

## 2021-11-22 DIAGNOSIS — G4700 Insomnia, unspecified: Secondary | ICD-10-CM

## 2021-11-22 DIAGNOSIS — E559 Vitamin D deficiency, unspecified: Secondary | ICD-10-CM

## 2021-11-22 DIAGNOSIS — E782 Mixed hyperlipidemia: Secondary | ICD-10-CM

## 2021-11-22 NOTE — Telephone Encounter (Signed)
Pt has CPE scheduled for 11/20 and lab visit scheduled for 11/15.  Please enter lab orders.

## 2021-11-23 NOTE — Telephone Encounter (Signed)
Labs ordered.    Thanks Almyra Free

## 2021-12-16 LAB — HM MAMMOGRAPHY

## 2021-12-17 ENCOUNTER — Other Ambulatory Visit (INDEPENDENT_AMBULATORY_CARE_PROVIDER_SITE_OTHER): Payer: No Typology Code available for payment source

## 2021-12-17 ENCOUNTER — Other Ambulatory Visit: Payer: Self-pay | Admitting: Internal Medicine

## 2021-12-17 DIAGNOSIS — E782 Mixed hyperlipidemia: Secondary | ICD-10-CM

## 2021-12-17 DIAGNOSIS — E559 Vitamin D deficiency, unspecified: Secondary | ICD-10-CM

## 2021-12-17 DIAGNOSIS — R7303 Prediabetes: Secondary | ICD-10-CM | POA: Diagnosis not present

## 2021-12-17 LAB — COMPREHENSIVE METABOLIC PANEL
ALT: 18 U/L (ref 0–35)
AST: 17 U/L (ref 0–37)
Albumin: 4.1 g/dL (ref 3.5–5.2)
Alkaline Phosphatase: 39 U/L (ref 39–117)
BUN: 8 mg/dL (ref 6–23)
CO2: 31 mEq/L (ref 19–32)
Calcium: 8.8 mg/dL (ref 8.4–10.5)
Chloride: 98 mEq/L (ref 96–112)
Creatinine, Ser: 0.58 mg/dL (ref 0.40–1.20)
GFR: 97.79 mL/min (ref >60.00)
Glucose, Bld: 95 mg/dL (ref 70–99)
Potassium: 3.8 mEq/L (ref 3.5–5.1)
Sodium: 134 mEq/L — ABNORMAL LOW (ref 135–145)
Total Bilirubin: 0.4 mg/dL (ref 0.2–1.2)
Total Protein: 6.6 g/dL (ref 6.0–8.3)

## 2021-12-17 LAB — LIPID PANEL
Cholesterol: 187 mg/dL (ref 0–200)
HDL: 40.6 mg/dL (ref >39.00)
NonHDL: 146
Total CHOL/HDL Ratio: 5
Triglycerides: 249 mg/dL — ABNORMAL HIGH (ref 0.0–149.0)
VLDL: 49.8 mg/dL — ABNORMAL HIGH (ref 0.0–40.0)

## 2021-12-17 LAB — CBC WITH DIFFERENTIAL/PLATELET
Basophils Absolute: 0 10*3/uL (ref 0.0–0.1)
Basophils Relative: 0.7 % (ref 0.0–3.0)
Eosinophils Absolute: 0.2 10*3/uL (ref 0.0–0.7)
Eosinophils Relative: 2.5 % (ref 0.0–5.0)
HCT: 45 % (ref 36.0–46.0)
Hemoglobin: 15.2 g/dL — ABNORMAL HIGH (ref 12.0–15.0)
Lymphocytes Relative: 19.4 % (ref 12.0–46.0)
Lymphs Abs: 1.3 10*3/uL (ref 0.7–4.0)
MCHC: 33.7 g/dL (ref 30.0–36.0)
MCV: 90 fl (ref 78.0–100.0)
Monocytes Absolute: 0.3 10*3/uL (ref 0.1–1.0)
Monocytes Relative: 5.1 % (ref 3.0–12.0)
Neutro Abs: 5 10*3/uL (ref 1.4–7.7)
Neutrophils Relative %: 72.3 % (ref 43.0–77.0)
Platelets: 194 10*3/uL (ref 150.0–400.0)
RBC: 5 Mil/uL (ref 3.87–5.11)
RDW: 14.2 % (ref 11.5–15.5)
WBC: 6.9 10*3/uL (ref 4.0–10.5)

## 2021-12-17 LAB — VITAMIN D 25 HYDROXY (VIT D DEFICIENCY, FRACTURES): VITD: 29.21 ng/mL — ABNORMAL LOW (ref 30.00–100.00)

## 2021-12-17 LAB — TSH: TSH: 1.52 u[IU]/mL (ref 0.35–5.50)

## 2021-12-17 LAB — HEMOGLOBIN A1C: Hgb A1c MFr Bld: 5.8 % (ref 4.6–6.5)

## 2021-12-17 LAB — LDL CHOLESTEROL, DIRECT: Direct LDL: 118 mg/dL

## 2021-12-18 ENCOUNTER — Other Ambulatory Visit: Payer: PRIVATE HEALTH INSURANCE

## 2021-12-22 ENCOUNTER — Encounter: Payer: Self-pay | Admitting: Internal Medicine

## 2021-12-22 NOTE — Progress Notes (Unsigned)
Subjective:    Patient ID: Kristin Rasmussen, female    DOB: [birth year], 61 y.o.   MRN: [id number]      HPI Kristin Rasmussen is here for a Physical exam.      Medications and allergies reviewed with patient and updated if appropriate.  Current Outpatient Medications on File Prior to Visit  Medication Sig Dispense Refill   ALPRAZolam (XANAX) 0.25 MG tablet TAKE 1 TABLET BY MOUTH THREE TIMES DAILY AS NEEDED FOR ANXIETY 90 tablet 2   baclofen (LIORESAL) 10 MG tablet Take 1 tablet (10 mg total) by mouth 3 (three) times daily. 120 tablet 3   calcium citrate-vitamin D (CITRACAL+D) 315-200 MG-UNIT tablet Take 1 tablet by mouth 2 (two) times daily.     cetirizine (ZYRTEC) 10 MG tablet Take 10 mg by mouth daily.     Cholecalciferol (VITAMIN D3) 50 MCG (2000 UT) capsule Take 2,000 Units by mouth daily.     docusate sodium (COLACE) 100 MG capsule Take 100 mg by mouth daily as needed for mild constipation.     DOTTI 0.1 MG/24HR patch Place onto the skin.     escitalopram (LEXAPRO) 20 MG tablet Take 1 tablet (20 mg total) by mouth daily. 90 tablet 3   esomeprazole (NEXIUM) 40 MG capsule Take 1 capsule by mouth once daily 90 capsule 0   Magnesium 400 MG TABS Take by mouth daily.     oxybutynin (DITROPAN XL) 15 MG 24 hr tablet      pravastatin (PRAVACHOL) 20 MG tablet Take 1 tablet (20 mg total) by mouth daily. 90 tablet 3   Prenatal Vit-Fe Fumarate-FA (PRENATAL VITAMIN PO) Take by mouth daily.     No current facility-administered medications on file prior to visit.    Review of Systems  Constitutional:  Negative for fever.  HENT:  Positive for postnasal drip.   Eyes:  Negative for visual disturbance.  Respiratory:  Negative for cough, shortness of breath and wheezing.   Cardiovascular:  Negative for chest pain, palpitations and leg swelling (minimal at end of day).  Gastrointestinal:  Negative for abdominal pain, blood in stool, constipation, diarrhea and nausea.       Gerd controlled   Genitourinary:  Negative for dysuria.  Musculoskeletal:  Positive for back pain (intermittent). Negative for arthralgias.  Skin:  Negative for rash.  Neurological:  Negative for light-headedness and headaches.  Psychiatric/Behavioral:  Negative for dysphoric mood. The patient is nervous/anxious.        Objective:   Vitals:   12/23/21 0909  BP: 112/76  Pulse: 74  Temp: 98.2 F (36.8 C)  SpO2: 99%   Filed Weights   12/23/21 0909  Weight: 211 lb (95.7 kg)   Body mass index is 36.79 kg/m.  BP Readings from Last 3 Encounters:  12/23/21 112/76  10/22/21 (!) 104/92  05/27/21 120/64    Wt Readings from Last 3 Encounters:  12/23/21 211 lb (95.7 kg)  10/22/21 213 lb 10 oz (96.9 kg)  05/27/21 212 lb 15.4 oz (96.6 kg)       Physical Exam Constitutional: She appears well-developed and well-nourished. No distress.  HENT:  Head: Normocephalic and atraumatic.  Right Ear: External ear normal. Normal ear canal and TM Left Ear: External ear normal.  Normal ear canal and TM Mouth/Throat: Oropharynx is clear and moist.  Eyes: Conjunctivae normal.  Neck: Neck supple. No tracheal deviation present. No thyromegaly present.  No carotid bruit  Cardiovascular: Normal rate, regular rhythm and normal heart sounds.  No murmur heard.  No edema. Pulmonary/Chest: Effort normal and breath sounds normal. No respiratory distress. She has no wheezes. She has no rales.  Breast: deferred   Abdominal: Soft. She exhibits no distension. There is no tenderness.  Lymphadenopathy: She has no cervical adenopathy.  Skin: Skin is warm and dry. She is not diaphoretic.  Psychiatric: She has a normal mood and affect. Her behavior is normal.     Lab Results  Component Value Date   WBC 6.9 12/17/2021   HGB 15.2 (H) 12/17/2021   HCT 45.0 12/17/2021   PLT 194.0 12/17/2021   GLUCOSE 95 12/17/2021   CHOL 187 12/17/2021   TRIG 249.0 (H) 12/17/2021   HDL 40.60 12/17/2021   LDLDIRECT 118.0 12/17/2021    LDLCALC 104 (H) 12/20/2019   ALT 18 12/17/2021   AST 17 12/17/2021   NA 134 (L) 12/17/2021   K 3.8 12/17/2021   CL 98 12/17/2021   CREATININE 0.58 12/17/2021   BUN 8 12/17/2021   CO2 31 12/17/2021   TSH 1.52 12/17/2021   INR 1.00 06/26/2010   HGBA1C 5.8 12/17/2021         Assessment & Plan:   Physical exam: Screening blood work  ordered Exercise  not regular, but is active - stressed regular exercise Weight  stressed weight loss Substance abuse  none   Reviewed recommended immunizations.   Health Maintenance  Topic Date Due   COVID-19 Vaccine (3 - Moderna risk series) 01/08/2022 (Originally 05/11/2019)   COLONOSCOPY (Pts 45-46yr Insurance coverage will need to be confirmed)  06/01/2022   MAMMOGRAM  12/11/2022   DEXA SCAN  12/09/2023   INFLUENZA VACCINE  Completed   Hepatitis C Screening  Completed   HIV Screening  Completed   Zoster Vaccines- Shingrix  Completed   HPV VACCINES  Aged Out          See Problem List for Assessment and Plan of chronic medical problems.

## 2021-12-22 NOTE — Patient Instructions (Addendum)
Your EKG was done and is normal.     Medications changes include :   none     Return in about 1 year (around 12/24/2022) for Physical Exam.    Health Maintenance, Female Adopting a healthy lifestyle and getting preventive care are important in promoting health and wellness. Ask your health care provider about: The right schedule for you to have regular tests and exams. Things you can do on your own to prevent diseases and keep yourself healthy. What should I know about diet, weight, and exercise? Eat a healthy diet  Eat a diet that includes plenty of vegetables, fruits, low-fat dairy products, and lean protein. Do not eat a lot of foods that are high in solid fats, added sugars, or sodium. Maintain a healthy weight Body mass index (BMI) is used to identify weight problems. It estimates body fat based on height and weight. Your health care provider can help determine your BMI and help you achieve or maintain a healthy weight. Get regular exercise Get regular exercise. This is one of the most important things you can do for your health. Most adults should: Exercise for at least 150 minutes each week. The exercise should increase your heart rate and make you sweat (moderate-intensity exercise). Do strengthening exercises at least twice a week. This is in addition to the moderate-intensity exercise. Spend less time sitting. Even light physical activity can be beneficial. Watch cholesterol and blood lipids Have your blood tested for lipids and cholesterol at 61 years of age, then have this test every 5 years. Have your cholesterol levels checked more often if: Your lipid or cholesterol levels are high. You are older than 61 years of age. You are at high risk for heart disease. What should I know about cancer screening? Depending on your health history and family history, you may need to have cancer screening at various ages. This may include screening for: Breast  cancer. Cervical cancer. Colorectal cancer. Skin cancer. Lung cancer. What should I know about heart disease, diabetes, and high blood pressure? Blood pressure and heart disease High blood pressure causes heart disease and increases the risk of stroke. This is more likely to develop in people who have high blood pressure readings or are overweight. Have your blood pressure checked: Every 3-5 years if you are 35-64 years of age. Every year if you are 16 years old or older. Diabetes Have regular diabetes screenings. This checks your fasting blood sugar level. Have the screening done: Once every three years after age 44 if you are at a normal weight and have a low risk for diabetes. More often and at a younger age if you are overweight or have a high risk for diabetes. What should I know about preventing infection? Hepatitis B If you have a higher risk for hepatitis B, you should be screened for this virus. Talk with your health care provider to find out if you are at risk for hepatitis B infection. Hepatitis C Testing is recommended for: Everyone born from [birth year] through 1965. Anyone with known risk factors for hepatitis C. Sexually transmitted infections (STIs) Get screened for STIs, including gonorrhea and chlamydia, if: You are sexually active and are younger than 61 years of age. You are older than 61 years of age and your health care provider tells you that you are at risk for this type of infection. Your sexual activity has changed since you were last screened, and you are at increased risk for chlamydia or gonorrhea.  Ask your health care provider if you are at risk. Ask your health care provider about whether you are at high risk for HIV. Your health care provider may recommend a prescription medicine to help prevent HIV infection. If you choose to take medicine to prevent HIV, you should first get tested for HIV. You should then be tested every 3 months for as long as you are taking  the medicine. Pregnancy If you are about to stop having your period (premenopausal) and you may become pregnant, seek counseling before you get pregnant. Take 400 to 800 micrograms (mcg) of folic acid every day if you become pregnant. Ask for birth control (contraception) if you want to prevent pregnancy. Osteoporosis and menopause Osteoporosis is a disease in which the bones lose minerals and strength with aging. This can result in bone fractures. If you are 27 years old or older, or if you are at risk for osteoporosis and fractures, ask your health care provider if you should: Be screened for bone loss. Take a calcium or vitamin D supplement to lower your risk of fractures. Be given hormone replacement therapy (HRT) to treat symptoms of menopause. Follow these instructions at home: Alcohol use Do not drink alcohol if: Your health care provider tells you not to drink. You are pregnant, may be pregnant, or are planning to become pregnant. If you drink alcohol: Limit how much you have to: 0-1 drink a day. Know how much alcohol is in your drink. In the [place] one drink equals one 12 oz bottle of beer (355 mL), one 5 oz glass of wine (148 mL), or one 1 oz glass of hard liquor (44 mL). Lifestyle Do not use any products that contain nicotine or tobacco. These products include cigarettes, chewing tobacco, and vaping devices, such as e-cigarettes. If you need help quitting, ask your health care provider. Do not use street drugs. Do not share needles. Ask your health care provider for help if you need support or information about quitting drugs. General instructions Schedule regular health, dental, and eye exams. Stay current with your vaccines. Tell your health care provider if: You often feel depressed. You have ever been abused or do not feel safe at home. Summary Adopting a healthy lifestyle and getting preventive care are important in promoting health and wellness. Follow your health  care provider's instructions about healthy diet, exercising, and getting tested or screened for diseases. Follow your health care provider's instructions on monitoring your cholesterol and blood pressure. This information is not intended to replace advice given to you by your health care provider. Make sure you discuss any questions you have with your health care provider. Document Revised: 06/11/2020 Document Reviewed: 06/11/2020 Elsevier Patient Education  [place].

## 2021-12-23 ENCOUNTER — Ambulatory Visit (INDEPENDENT_AMBULATORY_CARE_PROVIDER_SITE_OTHER): Payer: No Typology Code available for payment source | Admitting: Internal Medicine

## 2021-12-23 VITALS — BP 112/76 | HR 74 | Temp 98.2°F | Ht 63.5 in | Wt 211.0 lb

## 2021-12-23 DIAGNOSIS — Z0000 Encounter for general adult medical examination without abnormal findings: Secondary | ICD-10-CM

## 2021-12-23 DIAGNOSIS — E782 Mixed hyperlipidemia: Secondary | ICD-10-CM | POA: Diagnosis not present

## 2021-12-23 DIAGNOSIS — E559 Vitamin D deficiency, unspecified: Secondary | ICD-10-CM | POA: Diagnosis not present

## 2021-12-23 DIAGNOSIS — R7303 Prediabetes: Secondary | ICD-10-CM

## 2021-12-23 DIAGNOSIS — K219 Gastro-esophageal reflux disease without esophagitis: Secondary | ICD-10-CM

## 2021-12-23 DIAGNOSIS — M85852 Other specified disorders of bone density and structure, left thigh: Secondary | ICD-10-CM

## 2021-12-23 DIAGNOSIS — F419 Anxiety disorder, unspecified: Secondary | ICD-10-CM

## 2021-12-23 DIAGNOSIS — E669 Obesity, unspecified: Secondary | ICD-10-CM

## 2021-12-23 NOTE — Assessment & Plan Note (Addendum)
Chronic Check a1c Low sugar / carb diet Stressed regular exercise Encouraged weight loss 

## 2021-12-23 NOTE — Assessment & Plan Note (Signed)
Chronic GERD controlled Continue Nexium 40 mg daily

## 2021-12-23 NOTE — Assessment & Plan Note (Signed)
Discussed importance of weight loss Stressed regular exercise - goal 30 minutes 5 days a week Discussed decreasing portions, decreasing sugars/carbs Increase veges, lean protein, fiber in diet

## 2021-12-23 NOTE — Assessment & Plan Note (Addendum)
Chronic Regular exercise and healthy diet encouraged Check lipid panel, CMP, TSH Continue pravastatin 20 mg daily At increased risk of CAD - EKG today NSR at 71 bpm, normal EKG, no change compared to 06/2016 EKG

## 2021-12-23 NOTE — Assessment & Plan Note (Signed)
Chronic Last DEXA 2020 was normal-repeat in 2025 Continue calcium and vitamin D Urged regular exercise

## 2021-12-23 NOTE — Assessment & Plan Note (Addendum)
Chronic Management per psychiatry On alprazolam as needed, Lexapro 20 mg daily Check TSH

## 2021-12-23 NOTE — Assessment & Plan Note (Signed)
Chronic Taking vitamin D daily Check vitamin D level  

## 2021-12-30 ENCOUNTER — Telehealth: Payer: Self-pay | Admitting: Internal Medicine

## 2021-12-30 NOTE — Telephone Encounter (Signed)
Not sure why there is a hold on them - never heard that before -- we reviewed them at her visit.

## 2021-12-30 NOTE — Telephone Encounter (Signed)
PT calls today in regards to their recent lab results. PT was wanting to print off a copy of their lab results for their records, but when attempting it on MyChart it stated there was a hold on these records. I had informed PT that I didn't see a reason as to why there would be a hold, but that I would double check with Dr.Burns to make sure it would be alright to print and send before proceeding.  CB for PT: [phone]

## 2021-12-31 NOTE — Telephone Encounter (Signed)
Attempted to reach patient a few times today but line was busy.  Copy of labs placed in mail today.

## 2022-01-09 ENCOUNTER — Encounter: Payer: Self-pay | Admitting: Internal Medicine

## 2022-01-09 NOTE — Progress Notes (Signed)
Outside notes received. Information abstracted. Notes sent to scan.  

## 2022-03-10 ENCOUNTER — Other Ambulatory Visit: Payer: Self-pay | Admitting: Internal Medicine

## 2022-03-15 ENCOUNTER — Other Ambulatory Visit: Payer: Self-pay | Admitting: Internal Medicine

## 2022-03-17 ENCOUNTER — Other Ambulatory Visit: Payer: Self-pay

## 2022-03-27 ENCOUNTER — Other Ambulatory Visit: Payer: Self-pay | Admitting: Adult Health

## 2022-03-27 DIAGNOSIS — G4700 Insomnia, unspecified: Secondary | ICD-10-CM

## 2022-03-27 DIAGNOSIS — F411 Generalized anxiety disorder: Secondary | ICD-10-CM

## 2022-04-07 ENCOUNTER — Encounter: Payer: Self-pay | Admitting: Internal Medicine

## 2022-04-27 ENCOUNTER — Ambulatory Visit
Admission: RE | Admit: 2022-04-27 | Discharge: 2022-04-27 | Disposition: A | Discharge status: Home / Self Care | Payer: No Typology Code available for payment source | Source: Ambulatory Visit | Attending: Orthopedic Surgery | Admitting: Orthopedic Surgery

## 2022-04-27 DIAGNOSIS — M7531 Calcific tendinitis of right shoulder: Secondary | ICD-10-CM

## 2022-04-30 ENCOUNTER — Encounter: Payer: Self-pay | Admitting: Internal Medicine

## 2022-04-30 NOTE — Progress Notes (Signed)
Outside notes received. Information abstracted. Notes sent to scan.  

## 2022-05-05 ENCOUNTER — Encounter (HOSPITAL_BASED_OUTPATIENT_CLINIC_OR_DEPARTMENT_OTHER): Payer: Self-pay | Admitting: Orthopedic Surgery

## 2022-05-05 ENCOUNTER — Other Ambulatory Visit: Payer: Self-pay

## 2022-05-12 NOTE — H&P (Signed)
PREOPERATIVE H&P  Chief Complaint: Right shoulder pain  HPI: Kristin Rasmussen is a 62 y.o. female who presents for preoperative history and physical with a diagnosis of right shoulder calcific tendonitis. She has had six months of pain or maybe even longer.  I have done a left shoulder scope for calcific tendonitis which came out great.  She would like the same outcome on her right shoulder.  We tried injections, anti-inflammatories and exercises.  None of this has improved symptoms.  She has used Baclofen and Tylenol. This is significantly impairing activities of daily living.  She has elected for surgical management.   Past Medical History:  Diagnosis Date   Anemia 2004   HCT 34.7   Anxiety    follows with psyc for same   Back pain    Basal cell cancer    x3; Dr Danella Deis   Bladder leak    Chest pain 06/2010   Costochondritis   Diverticulosis    Gallbladder problem    GERD (gastroesophageal reflux disease)    Hyperlipidemia    IBS (irritable bowel syndrome)    Melanoma 2006   x1   PCOS (polycystic ovarian syndrome)    Plantar fasciitis 08/13/2011   Left is significant and RT is mild by [place] criteria    Spasm of esophagus    Swelling    Thrombocytopenia 2002   133,000 platelets   Past Surgical History:  Procedure Laterality Date   CATARACT EXTRACTION W/PHACO Right 05/13/2021   Procedure: CATARACT EXTRACTION PHACO AND INTRAOCULAR LENS PLACEMENT (IOC);  Surgeon: Fabio Pierce, MD;  Location: [facility];  Service: Ophthalmology;  Laterality: Right;  CDE: 8.14    CATARACT EXTRACTION W/PHACO Left 05/27/2021   Procedure: CATARACT EXTRACTION PHACO AND INTRAOCULAR LENS PLACEMENT (IOC);  Surgeon: Fabio Pierce, MD;  Location: [facility];  Service: Ophthalmology;  Laterality: Left;  CDE: 6.55   CHOLECYSTECTOMY  2002   [facility]   COLONOSCOPY     MASS EXCISION Right 10/22/2021   Procedure: EXCISION MASS, RIGHT FOOT;  Surgeon: Teryl Lucy, MD;  Location: [facility];  Service:  Orthopedics;  Laterality: Right;   MELANOMA EXCISION     LUE 2006  [facility]; Derm : Dr Campbell Stall   SHOULDER SURGERY     Left   TONSILLECTOMY     TOTAL ABDOMINAL HYSTERECTOMY  1991   abnormal PAP hx   Social History   Socioeconomic History   Marital status: Married    Spouse name: Not on file   Number of children: 3   Years of education: 12   Highest education level: Not on file  Occupational History   Occupation: Chief Strategy Officer: [employer]  Tobacco Use   Smoking status: Never   Smokeless tobacco: Never  Substance and Sexual Activity   Alcohol use: Yes    Comment: rarely, only holiday's   Drug use: No   Sexual activity: Not on file  Other Topics Concern   Not on file  Social History Narrative   Does some yard work and walking for exercise - couple of days a week   Social Determinants of Corporate investment banker Strain: Not on file  Food Insecurity: Not on file  Transportation Needs: Not on file  Physical Activity: Not on file  Stress: Not on file  Social Connections: Not on file   Family History  Problem Relation Age of Onset   Diabetes Mother    Hypertension Mother  Obesity Mother    Hypertension Father    Colon polyps Father    GER disease Father    Skin cancer Father    Ovarian cancer Paternal Aunt    Heart attack Maternal Grandmother 69   Diabetes Maternal Grandmother    Heart attack Maternal Grandfather 21   Stroke Maternal Grandfather 37   Colon cancer Paternal Grandfather        unsure age   Allergies  Allergen Reactions   Tape     Surgical tape gives her blisters   Prior to Admission medications   Medication Sig Start Date End Date Taking? Authorizing Provider  ALPRAZolam (XANAX) 0.25 MG tablet TAKE 1 TABLET BY MOUTH THREE TIMES DAILY AS NEEDED FOR ANXIETY 03/27/22  Yes Mozingo, Thereasa Solo, NP  baclofen (LIORESAL) 10 MG tablet Take 1 tablet (10 mg total) by mouth 3 (three) times daily. 07/30/21  Yes Burns,  Bobette Mo, MD  calcium citrate-vitamin D (CITRACAL+D) 315-200 MG-UNIT tablet Take 1 tablet by mouth 2 (two) times daily.   Yes [provider]  cetirizine (ZYRTEC) 10 MG tablet Take 10 mg by mouth daily.   Yes [provider]  Cholecalciferol (VITAMIN D3) 50 MCG (2000 UT) capsule Take 2,000 Units by mouth daily.   Yes [provider]  docusate sodium (COLACE) 100 MG capsule Take 100 mg by mouth daily as needed for mild constipation.   Yes [provider]  DOTTI 0.1 MG/24HR patch Place onto the skin. 12/05/19  Yes [provider]  escitalopram (LEXAPRO) 20 MG tablet Take 1 tablet (20 mg total) by mouth daily. 07/15/21  Yes Mozingo, Thereasa Solo, NP  esomeprazole (NEXIUM) 40 MG capsule Take 1 capsule by mouth once daily 03/17/22  Yes Burns, Bobette Mo, MD  Magnesium 400 MG TABS Take by mouth daily.   Yes [provider]  oxybutynin (DITROPAN XL) 15 MG 24 hr tablet  10/24/18  Yes [provider]  pravastatin (PRAVACHOL) 20 MG tablet Take 1 tablet by mouth once daily 03/10/22  Yes Burns, Bobette Mo, MD  Prenatal Vit-Fe Fumarate-FA (PRENATAL VITAMIN PO) Take by mouth daily.   Yes [provider]     Positive ROS: All other systems have been reviewed and were otherwise negative with the exception of those mentioned in the HPI and as above.  Physical Exam: General: Alert, no acute distress Cardiovascular: No pedal edema Respiratory: No cyanosis, no use of accessory musculature GI: No organomegaly, abdomen is soft and non-tender Skin: No lesions in the area of chief complaint Neurologic: Sensation intact distally Psychiatric: Patient is competent for consent with normal mood and affect Lymphatic: No axillary or cervical lymphadenopathy  MUSCULOSKELETAL: On exam right shoulder has 0-170 degrees with positive impingement signs.  Mild pain over the [facility] joint.  Infraspinatus strength is intact.  She has a lot of pain over the biceps.     Imaging: MRI demonstrates some tendinopathy of the cuff.  She has x-rays that demonstrate some small calcification within the tendon itself laterally.    Assessment: Right shoulder calcific tendonitis   Plan: Plan for Procedure(s): SHOULDER ARTHROSCOPY WITH DISTAL CLAVICLE EXCISION SHOULDER ACROMIOPLASTY  The risks benefits and alternatives were discussed with the patient including but not limited to the risks of nonoperative treatment, versus surgical intervention including infection, bleeding, nerve injury,  blood clots, cardiopulmonary complications, morbidity, mortality, among others, and they were willing to proceed.   Armida Sans, PA-C    05/12/2022 9:22 AM

## 2022-05-13 ENCOUNTER — Encounter (HOSPITAL_BASED_OUTPATIENT_CLINIC_OR_DEPARTMENT_OTHER)
Admission: RE | Disposition: A | Discharge status: Home / Self Care | Payer: Self-pay | Source: Home / Self Care | Attending: Orthopedic Surgery

## 2022-05-13 ENCOUNTER — Ambulatory Visit (HOSPITAL_BASED_OUTPATIENT_CLINIC_OR_DEPARTMENT_OTHER)
Admission: RE | Admit: 2022-05-13 | Discharge: 2022-05-13 | Disposition: A | Discharge status: Home / Self Care | Payer: No Typology Code available for payment source | Attending: Orthopedic Surgery | Admitting: Orthopedic Surgery

## 2022-05-13 ENCOUNTER — Encounter (HOSPITAL_BASED_OUTPATIENT_CLINIC_OR_DEPARTMENT_OTHER): Payer: Self-pay | Admitting: Orthopedic Surgery

## 2022-05-13 ENCOUNTER — Ambulatory Visit (HOSPITAL_BASED_OUTPATIENT_CLINIC_OR_DEPARTMENT_OTHER): Payer: No Typology Code available for payment source | Admitting: Anesthesiology

## 2022-05-13 DIAGNOSIS — M19011 Primary osteoarthritis, right shoulder: Secondary | ICD-10-CM | POA: Insufficient documentation

## 2022-05-13 DIAGNOSIS — K219 Gastro-esophageal reflux disease without esophagitis: Secondary | ICD-10-CM | POA: Insufficient documentation

## 2022-05-13 DIAGNOSIS — M19012 Primary osteoarthritis, left shoulder: Secondary | ICD-10-CM | POA: Diagnosis not present

## 2022-05-13 DIAGNOSIS — M7531 Calcific tendinitis of right shoulder: Secondary | ICD-10-CM

## 2022-05-13 DIAGNOSIS — M7541 Impingement syndrome of right shoulder: Secondary | ICD-10-CM | POA: Diagnosis not present

## 2022-05-13 DIAGNOSIS — M25511 Pain in right shoulder: Secondary | ICD-10-CM | POA: Diagnosis present

## 2022-05-13 HISTORY — PX: SHOULDER ARTHROSCOPY WITH DISTAL CLAVICLE RESECTION: SHX5675

## 2022-05-13 HISTORY — PX: SHOULDER ACROMIOPLASTY: SHX6093

## 2022-05-13 SURGERY — SHOULDER ARTHROSCOPY WITH DISTAL CLAVICLE RESECTION
Anesthesia: General | Site: Shoulder | Laterality: Right

## 2022-05-13 MED ORDER — MIDAZOLAM HCL 2 MG/2ML IJ SOLN
2.0000 mg | Freq: Once | INTRAMUSCULAR | Status: AC
  Administered 2022-05-13: 2 mg via INTRAVENOUS

## 2022-05-13 MED ORDER — FENTANYL CITRATE (PF) 100 MCG/2ML IJ SOLN
50.0000 ug | Freq: Once | INTRAMUSCULAR | Status: AC
  Administered 2022-05-13: 50 ug via INTRAVENOUS

## 2022-05-13 MED ORDER — FENTANYL CITRATE (PF) 250 MCG/5ML IJ SOLN
INTRAMUSCULAR | Status: DC | PRN
  Administered 2022-05-13: 50 ug via INTRAVENOUS

## 2022-05-13 MED ORDER — ONDANSETRON HCL 4 MG/2ML IJ SOLN
INTRAMUSCULAR | Status: DC | PRN
  Administered 2022-05-13: 4 mg via INTRAVENOUS

## 2022-05-13 MED ORDER — MIDAZOLAM HCL 2 MG/2ML IJ SOLN
INTRAMUSCULAR | Status: AC
  Filled 2022-05-13: qty 2

## 2022-05-13 MED ORDER — BUPIVACAINE LIPOSOME 1.3 % IJ SUSP
INTRAMUSCULAR | Status: DC | PRN
  Administered 2022-05-13: 10 mL via PERINEURAL

## 2022-05-13 MED ORDER — LACTATED RINGERS IV SOLN: INTRAVENOUS | Status: DC

## 2022-05-13 MED ORDER — PHENYLEPHRINE HCL (PRESSORS) 10 MG/ML IV SOLN
INTRAVENOUS | Status: AC
  Filled 2022-05-13: qty 1

## 2022-05-13 MED ORDER — MEPERIDINE HCL 25 MG/ML IJ SOLN: 6.2500 mg | INTRAMUSCULAR | Status: DC | PRN

## 2022-05-13 MED ORDER — SODIUM CHLORIDE 0.9 % IR SOLN
Status: DC | PRN
  Administered 2022-05-13: 8000 mL

## 2022-05-13 MED ORDER — EPHEDRINE 5 MG/ML INJ
INTRAVENOUS | Status: AC
  Filled 2022-05-13: qty 5

## 2022-05-13 MED ORDER — SUGAMMADEX SODIUM 200 MG/2ML IV SOLN
INTRAVENOUS | Status: DC | PRN
  Administered 2022-05-13: 200 mg via INTRAVENOUS

## 2022-05-13 MED ORDER — CELECOXIB 200 MG PO CAPS
200.0000 mg | ORAL_CAPSULE | Freq: Once | ORAL | Status: AC
  Administered 2022-05-13: 200 mg via ORAL

## 2022-05-13 MED ORDER — DEXAMETHASONE SODIUM PHOSPHATE 10 MG/ML IJ SOLN
INTRAMUSCULAR | Status: DC | PRN
  Administered 2022-05-13: 4 mg via INTRAVENOUS

## 2022-05-13 MED ORDER — ACETAMINOPHEN 500 MG PO TABS
1000.0000 mg | ORAL_TABLET | Freq: Once | ORAL | Status: AC
  Administered 2022-05-13: 1000 mg via ORAL

## 2022-05-13 MED ORDER — ACETAMINOPHEN 325 MG PO TABS: 325.0000 mg | ORAL_TABLET | ORAL | Status: DC | PRN

## 2022-05-13 MED ORDER — OXYCODONE HCL 5 MG PO TABS: 5.0000 mg | ORAL_TABLET | Freq: Once | ORAL | Status: DC | PRN

## 2022-05-13 MED ORDER — ACETAMINOPHEN 160 MG/5ML PO SOLN: 325.0000 mg | ORAL | Status: DC | PRN

## 2022-05-13 MED ORDER — ACETAMINOPHEN 500 MG PO TABS: 1000.0000 mg | ORAL_TABLET | Freq: Once | ORAL | Status: AC

## 2022-05-13 MED ORDER — BUPIVACAINE HCL (PF) 0.5 % IJ SOLN
INTRAMUSCULAR | Status: DC | PRN
  Administered 2022-05-13: 10 mL via PERINEURAL

## 2022-05-13 MED ORDER — PHENYLEPHRINE HCL-NACL 20-0.9 MG/250ML-% IV SOLN
INTRAVENOUS | Status: DC | PRN
  Administered 2022-05-13: 25 ug/min via INTRAVENOUS

## 2022-05-13 MED ORDER — CEFAZOLIN SODIUM-DEXTROSE 2-4 GM/100ML-% IV SOLN
2.0000 g | INTRAVENOUS | Status: AC
  Administered 2022-05-13: 2 g via INTRAVENOUS

## 2022-05-13 MED ORDER — LIDOCAINE 2% (20 MG/ML) 5 ML SYRINGE
INTRAMUSCULAR | Status: AC
  Filled 2022-05-13: qty 5

## 2022-05-13 MED ORDER — FENTANYL CITRATE (PF) 100 MCG/2ML IJ SOLN
INTRAMUSCULAR | Status: AC
  Filled 2022-05-13: qty 2

## 2022-05-13 MED ORDER — CEFAZOLIN SODIUM-DEXTROSE 2-4 GM/100ML-% IV SOLN
INTRAVENOUS | Status: AC
  Filled 2022-05-13: qty 100

## 2022-05-13 MED ORDER — ONDANSETRON HCL 4 MG/2ML IJ SOLN
INTRAMUSCULAR | Status: AC
  Filled 2022-05-13: qty 2

## 2022-05-13 MED ORDER — OXYCODONE HCL 5 MG/5ML PO SOLN: 5.0000 mg | Freq: Once | ORAL | Status: DC | PRN

## 2022-05-13 MED ORDER — FENTANYL CITRATE (PF) 100 MCG/2ML IJ SOLN: 25.0000 ug | INTRAMUSCULAR | Status: DC | PRN

## 2022-05-13 MED ORDER — ACETAMINOPHEN 500 MG PO TABS
ORAL_TABLET | ORAL | Status: AC
  Filled 2022-05-13: qty 2

## 2022-05-13 MED ORDER — ONDANSETRON HCL 4 MG/2ML IJ SOLN: 4.0000 mg | Freq: Once | INTRAMUSCULAR | Status: DC | PRN

## 2022-05-13 MED ORDER — DEXAMETHASONE SODIUM PHOSPHATE 10 MG/ML IJ SOLN
INTRAMUSCULAR | Status: AC
  Filled 2022-05-13: qty 1

## 2022-05-13 MED ORDER — PROPOFOL 10 MG/ML IV BOLUS
INTRAVENOUS | Status: AC
  Filled 2022-05-13: qty 20

## 2022-05-13 MED ORDER — ROCURONIUM BROMIDE 10 MG/ML (PF) SYRINGE
PREFILLED_SYRINGE | INTRAVENOUS | Status: AC
  Filled 2022-05-13: qty 10

## 2022-05-13 MED ORDER — ALBUTEROL SULFATE HFA 108 (90 BASE) MCG/ACT IN AERS
INHALATION_SPRAY | RESPIRATORY_TRACT | Status: DC | PRN
  Administered 2022-05-13 (×2): 2 via RESPIRATORY_TRACT

## 2022-05-13 MED ORDER — PHENYLEPHRINE 80 MCG/ML (10ML) SYRINGE FOR IV PUSH (FOR BLOOD PRESSURE SUPPORT)
PREFILLED_SYRINGE | INTRAVENOUS | Status: DC | PRN
  Administered 2022-05-13: 160 ug via INTRAVENOUS

## 2022-05-13 MED ORDER — LIDOCAINE 2% (20 MG/ML) 5 ML SYRINGE
INTRAMUSCULAR | Status: DC | PRN
  Administered 2022-05-13: 100 mg via INTRAVENOUS

## 2022-05-13 MED ORDER — PROPOFOL 10 MG/ML IV BOLUS
INTRAVENOUS | Status: DC | PRN
  Administered 2022-05-13: 200 mg via INTRAVENOUS

## 2022-05-13 MED ORDER — CELECOXIB 200 MG PO CAPS
ORAL_CAPSULE | ORAL | Status: AC
  Filled 2022-05-13: qty 1

## 2022-05-13 MED ORDER — ROCURONIUM BROMIDE 10 MG/ML (PF) SYRINGE
PREFILLED_SYRINGE | INTRAVENOUS | Status: DC | PRN
  Administered 2022-05-13: 60 mg via INTRAVENOUS

## 2022-05-13 SURGICAL SUPPLY — 58 items
AID PSTN UNV HD RSTRNT DISP (MISCELLANEOUS) ×1
ANCHOR SUT BIO SW 4.75X19.1 (Anchor) IMPLANT
BLADE EXCALIBUR 4.0X13 (MISCELLANEOUS) IMPLANT
BLADE SURG 15 STRL LF DISP TIS (BLADE) IMPLANT
BLADE SURG 15 STRL SS (BLADE)
BURR OVAL 8 FLU 5.0X13 (MISCELLANEOUS) IMPLANT
CANNULA 5.75X71 LONG (CANNULA) IMPLANT
CANNULA TWIST IN 8.25X7CM (CANNULA) IMPLANT
CLSR STERI-STRIP ANTIMIC 1/2X4 (GAUZE/BANDAGES/DRESSINGS) ×1 IMPLANT
DISSECTOR  3.8MM X 13CM (MISCELLANEOUS) ×1
DISSECTOR 3.8MM X 13CM (MISCELLANEOUS) ×1 IMPLANT
DRAPE IMP U-DRAPE 54X76 (DRAPES) ×1 IMPLANT
DRAPE INCISE IOBAN 66X45 STRL (DRAPES) ×1 IMPLANT
DRAPE SHOULDER BEACH CHAIR (DRAPES) ×1 IMPLANT
DRAPE SURG 17X23 STRL (DRAPES) IMPLANT
DRAPE U-SHAPE 47X51 STRL (DRAPES) ×1 IMPLANT
DURAPREP 26ML APPLICATOR (WOUND CARE) ×1 IMPLANT
ELECT REM PT RETURN 9FT ADLT (ELECTROSURGICAL)
ELECTRODE REM PT RTRN 9FT ADLT (ELECTROSURGICAL) IMPLANT
FIBERSTICK 2 (SUTURE) IMPLANT
GAUZE PAD ABD 8X10 STRL (GAUZE/BANDAGES/DRESSINGS) ×1 IMPLANT
GAUZE SPONGE 4X4 12PLY STRL (GAUZE/BANDAGES/DRESSINGS) ×1 IMPLANT
GLOVE BIO SURGEON STRL SZ7 (GLOVE) ×1 IMPLANT
GLOVE BIOGEL PI IND STRL 7.0 (GLOVE) ×1 IMPLANT
GLOVE BIOGEL PI IND STRL 8 (GLOVE) ×2 IMPLANT
GLOVE ORTHO TXT STRL SZ7.5 (GLOVE) ×1 IMPLANT
GLOVE SURG SS PI 7.0 STRL IVOR (GLOVE) IMPLANT
GOWN STRL REUS W/ TWL LRG LVL3 (GOWN DISPOSABLE) ×1 IMPLANT
GOWN STRL REUS W/ TWL XL LVL3 (GOWN DISPOSABLE) ×2 IMPLANT
GOWN STRL REUS W/TWL LRG LVL3 (GOWN DISPOSABLE) ×1
GOWN STRL REUS W/TWL XL LVL3 (GOWN DISPOSABLE) ×2
KIT SHOULDER TRACTION (DRAPES) ×1 IMPLANT
LASSO 90 CVE QUICKPAS (DISPOSABLE) IMPLANT
MANIFOLD NEPTUNE II (INSTRUMENTS) ×1 IMPLANT
NDL HD SCORPION MEGA LOADER (NEEDLE) ×1 IMPLANT
PACK ARTHROSCOPY DSU (CUSTOM PROCEDURE TRAY) ×1 IMPLANT
PACK BASIN DAY SURGERY FS (CUSTOM PROCEDURE TRAY) ×1 IMPLANT
PORT APPOLLO RF 90DEGREE MULTI (SURGICAL WAND) ×1 IMPLANT
RESTRAINT HEAD UNIVERSAL NS (MISCELLANEOUS) IMPLANT
SHEET MEDIUM DRAPE 40X70 STRL (DRAPES) ×1 IMPLANT
SLEEVE SCD COMPRESS KNEE MED (STOCKING) ×1 IMPLANT
SLING ARM FOAM STRAP LRG (SOFTGOODS) IMPLANT
SLING ARM IMMOBILIZER LRG (SOFTGOODS) IMPLANT
SPIKE FLUID TRANSFER (MISCELLANEOUS) IMPLANT
SUPPORT WRAP ARM LG (MISCELLANEOUS) IMPLANT
SUT FIBERWIRE #2 38 T-5 BLUE (SUTURE)
SUT MNCRL AB 4-0 PS2 18 (SUTURE) ×1 IMPLANT
SUT PDS AB 1 CT  36 (SUTURE)
SUT PDS AB 1 CT 36 (SUTURE) IMPLANT
SUT TIGER TAPE 7 IN WHITE (SUTURE) IMPLANT
SUT VIC AB 3-0 SH 27 (SUTURE)
SUT VIC AB 3-0 SH 27X BRD (SUTURE) IMPLANT
SUTURE FIBERWR #2 38 T-5 BLUE (SUTURE) IMPLANT
TAPE FIBER 2MM 7IN #2 BLUE (SUTURE) IMPLANT
TOWEL GREEN STERILE FF (TOWEL DISPOSABLE) ×1 IMPLANT
TUBE CONNECTING 20X1/4 (TUBING) ×1 IMPLANT
TUBING ARTHROSCOPY IRRIG 16FT (MISCELLANEOUS) ×1 IMPLANT
WATER STERILE IRR 1000ML POUR (IV SOLUTION) ×1 IMPLANT

## 2022-05-13 NOTE — Transfer of Care (Signed)
Immediate Anesthesia Transfer of Care Note  Patient: Kristin Rasmussen  Procedure(s) Performed: SHOULDER ARTHROSCOPY WITH DISTAL CLAVICLE EXCISION (Right: Shoulder) SHOULDER ACROMIOPLASTY (Right: Shoulder)  Patient Location: PACU  Anesthesia Type:General  Level of Consciousness: awake, alert , oriented, and patient cooperative  Airway & Oxygen Therapy: Patient Spontanous Breathing and Patient connected to face mask oxygen  Post-op Assessment: Report given to RN and Post -op Vital signs reviewed and stable  Post vital signs: Reviewed and stable  Last Vitals:  Vitals Value Taken Time  BP 106/82   Temp    Pulse 87   Resp 18   SpO2 94     Last Pain:  Vitals:   05/13/22 0758  TempSrc: Oral  PainSc: 4       Patients Stated Pain Goal: 4 (05/13/22 0758)  Complications: No notable events documented.

## 2022-05-13 NOTE — Anesthesia Preprocedure Evaluation (Signed)
Anesthesia Evaluation  Patient identified by MRN, date of birth, ID band Patient awake    Reviewed: Allergy & Precautions, NPO status , Patient's Chart, lab work & pertinent test results  Airway Mallampati: II  TM Distance: >3 FB Neck ROM: Full    Dental no notable dental hx. (+) Teeth Intact, Dental Advisory Given   Pulmonary neg pulmonary ROS   Pulmonary exam normal breath sounds clear to auscultation       Cardiovascular negative cardio ROS Normal cardiovascular exam Rhythm:Regular Rate:Normal     Neuro/Psych   Anxiety     negative neurological ROS  negative psych ROS   GI/Hepatic Neg liver ROS,GERD  Medicated and Controlled,,  Endo/Other  negative endocrine ROS    Renal/GU negative Renal ROS  negative genitourinary   Musculoskeletal negative musculoskeletal ROS (+)    Abdominal   Peds negative pediatric ROS (+)  Hematology negative hematology ROS (+) Blood dyscrasia, anemia   Anesthesia Other Findings   Reproductive/Obstetrics negative OB ROS                             Anesthesia Physical Anesthesia Plan  ASA: 2  Anesthesia Plan: General   Post-op Pain Management: Regional block*, Tylenol PO (pre-op)* and Celebrex PO (pre-op)*   Induction: Intravenous  PONV Risk Score and Plan: 3 and Ondansetron, Dexamethasone, Treatment may vary due to age or medical condition and Midazolam  Airway Management Planned: Oral ETT  Additional Equipment: None  Intra-op Plan:   Post-operative Plan: Extubation in OR  Informed Consent: I have reviewed the patients History and Physical, chart, labs and discussed the procedure including the risks, benefits and alternatives for the proposed anesthesia with the patient or authorized representative who has indicated his/her understanding and acceptance.     Dental advisory given  Plan Discussed with: CRNA and Anesthesiologist  Anesthesia Plan  Comments: (Discussed both nerve block for pain relief post-op and GA; including NV, sore throat, dental injury, and pulmonary complications)        Anesthesia Quick Evaluation

## 2022-05-13 NOTE — Anesthesia Procedure Notes (Signed)
Procedure Name: Intubation Date/Time: 05/13/2022 10:20 AM  Performed by: Demetrio Lapping, CRNAPre-anesthesia Checklist: Patient identified, Emergency Drugs available, Suction available and Patient being monitored Patient Re-evaluated:Patient Re-evaluated prior to induction Oxygen Delivery Method: Circle System Utilized Preoxygenation: Pre-oxygenation with 100% oxygen Induction Type: IV induction Ventilation: Mask ventilation without difficulty Laryngoscope Size: Mac and 4 Grade View: Grade I Tube type: Oral Tube size: 7.0 mm Number of attempts: 1 (Gr 1 view with head lift and cricoid pressure provided by MDA) Airway Equipment and Method: Stylet Placement Confirmation: ETT inserted through vocal cords under direct vision, positive ETCO2 and breath sounds checked- equal and bilateral Secured at: 21 cm Tube secured with: Tape Dental Injury: Teeth and Oropharynx as per pre-operative assessment

## 2022-05-13 NOTE — Op Note (Signed)
05/13/2022  11:32 AM  PATIENT:  Kristin Rasmussen    PRE-OPERATIVE DIAGNOSIS: Right shoulder calcific tendinitis, impingement syndrome, AC joint arthrosis  POST-OPERATIVE DIAGNOSIS:  Same  PROCEDURE: Right shoulder arthroscopy with limited debridement, acromioplasty, distal clavicle resection  SURGEON:  Eulas Post, MD  PHYSICIAN ASSISTANT: Janine Ores, PA-C, present and scrubbed throughout the case, critical for completion in a timely fashion, and for retraction, instrumentation, and closure.  ANESTHESIA:   General  PREOPERATIVE INDICATIONS:  [name] is a  62 y.o. female with a diagnosis of right shoulder impingement syndrome, calcific tendinitis, AC joint arthrosis who failed conservative measures and elected for surgical management.    The risks benefits and alternatives were discussed with the patient preoperatively including but not limited to the risks of infection, bleeding, nerve injury, cardiopulmonary complications, the need for revision surgery, among others, and the patient was willing to proceed.  ESTIMATED BLOOD LOSS: Minimal  OPERATIVE IMPLANTS: None  OPERATIVE FINDINGS: She had full motion during examination under anesthesia, no signs for adhesive capsulitis.  The intra-articular structures were all normal.  The subacromial space had thickened bursa, with signs of impingement syndrome and AC joint arthrosis.  I did not find a dramatic amount of calcium released upon trephination of the tendon.  OPERATIVE PROCEDURE: The patient was brought to the operating room and placed in supine position.  General anesthesia was administered.  The right upper extremity was examined and had full motion with intact stability.  The upper extremity was prepped and draped in usual sterile fashion.  Timeout performed.  Diagnostic arthroscopy was carried out.  The biceps tendon, undersurface of the cuff, glenohumeral articular cartilage, superior labrum, anterior and posterior labrum  were all normal.  I went to the subacromial space, I remove the thickened bursa, performed a CA ligament release, light acromioplasty, and debrided the subdeltoid bursa as well as the subdeltoid fascia with a shaver.  I palpated the tendon with a probe, and did not really feel any discrete areas of hardening, I did trephinate the tendon slightly in multiple places, and did not release much in the way of calcium.  I turned my attention to the distal clavicle, exposed the distal clavicle itself, and removed 1 cm of bone using the bur from the anterior portal.  I confirmed adequate resection from viewing from the anterior portal as well.  Complete debridement was carried out, trephination, acromioplasty and distal clavicle resection.  The wounds were closed with Monocryl followed by Steri-Strips and sterile gauze and she was awakened and returned to PACU in stable and satisfactory condition.  There were no complications and she tolerated the procedure well.

## 2022-05-13 NOTE — Anesthesia Procedure Notes (Addendum)
Anesthesia Regional Block: Interscalene brachial plexus block   Pre-Anesthetic Checklist: , timeout performed,  Correct Patient, Correct Site, Correct Laterality,  Correct Procedure, Correct Position, site marked,  Risks and benefits discussed,  Surgical consent,  Pre-op evaluation,  At surgeon's request and post-op pain management  Laterality: Right  Prep: chloraprep       Needles:  Injection technique: Single-shot  Needle Type: Echogenic Stimulator Needle     Needle Length: 5cm  Needle Gauge: 22     Additional Needles:   Procedures:, nerve stimulator,,, ultrasound used (permanent image in chart),,     Nerve Stimulator or Paresthesia:  Response: hand, 0.45 mA  Additional Responses:   Narrative:  Start time: 05/13/2022 9:36 AM End time: 05/13/2022 9:40 AM Injection made incrementally with aspirations every 5 mL.  Performed by: Personally  Anesthesiologist: Bethena Midget, MD  Additional Notes: Functioning IV was confirmed and monitors were applied.  A 13mm 22ga Arrow echogenic stimulator needle was used. Sterile prep and drape,hand hygiene and sterile gloves were used. Ultrasound guidance: relevant anatomy identified, needle position confirmed, local anesthetic spread visualized around nerve(s)., vascular puncture avoided.  Image printed for medical record. Negative aspiration and negative test dose prior to incremental administration of local anesthetic. The patient tolerated the procedure well.

## 2022-05-13 NOTE — Interval H&P Note (Signed)
History and Physical Interval Note:  05/13/2022 9:26 AM  Kristin Rasmussen  has presented today for surgery, with the diagnosis of right shoulder cartilage disorder, impingement syndrome,OA.  The various methods of treatment have been discussed with the patient and family. After consideration of risks, benefits and other options for treatment, the patient has consented to  Procedure(s): SHOULDER ARTHROSCOPY WITH DISTAL CLAVICLE EXCISION (Right) SHOULDER ACROMIOPLASTY (Right) as a surgical intervention.  The patient's history has been reviewed, patient examined, no change in status, stable for surgery.  I have reviewed the patient's chart and labs.  Questions were answered to the patient's satisfaction.     Eulas Post

## 2022-05-13 NOTE — Discharge Instructions (Addendum)
Post Anesthesia Home Care Instructions  Activity: Get plenty of rest for the remainder of the day. A responsible individual must stay with you for 24 hours following the procedure.  For the next 24 hours, DO NOT: -Drive a car -Advertising copywriter -Drink alcoholic beverages -Take any medication unless instructed by your physician -Make any legal decisions or sign important papers.  Meals: Start with liquid foods such as gelatin or soup. Progress to regular foods as tolerated. Avoid greasy, spicy, heavy foods. If nausea and/or vomiting occur, drink only clear liquids until the nausea and/or vomiting subsides. Call your physician if vomiting continues.  Special Instructions/Symptoms: Your throat may feel dry or sore from the anesthesia or the breathing tube placed in your throat during surgery. If this causes discomfort, gargle with warm salt water. The discomfort should disappear within 24 hours.  If you had a scopolamine patch placed behind your ear for the management of post- operative nausea and/or vomiting:  1. The medication in the patch is effective for 72 hours, after which it should be removed.  Wrap patch in a tissue and discard in the trash. Wash hands thoroughly with soap and water. 2. You may remove the patch earlier than 72 hours if you experience unpleasant side effects which may include dry mouth, dizziness or visual disturbances. 3. Avoid touching the patch. Wash your hands with soap and water after contact with the patch.     Regional Anesthesia Blocks  1. Numbness or the inability to move the "blocked" extremity may last from 3-48 hours after placement. The length of time depends on the medication injected and your individual response to the medication. If the numbness is not going away after 48 hours, call your surgeon.  2. The extremity that is blocked will need to be protected until the numbness is gone and the  Strength has returned. Because you cannot feel it, you will  need to take extra care to avoid injury. Because it may be weak, you may have difficulty moving it or using it. You may not know what position it is in without looking at it while the block is in effect.  3. For blocks in the legs and feet, returning to weight bearing and walking needs to be done carefully. You will need to wait until the numbness is entirely gone and the strength has returned. You should be able to move your leg and foot normally before you try and bear weight or walk. You will need someone to be with you when you first try to ensure you do not fall and possibly risk injury.  4. Bruising and tenderness at the needle site are common side effects and will resolve in a few days.  5. Persistent numbness or new problems with movement should be communicated to the surgeon or the [facility] [phone] [facility] [phone]).Regional Anesthesia Blocks  1. Numbness or the inability to move the "blocked" extremity may last from 3-48 hours after placement. The length of time depends on the medication injected and your individual response to the medication. If the numbness is not going away after 48 hours, call your surgeon.  2. The extremity that is blocked will need to be protected until the numbness is gone and the  Strength has returned. Because you cannot feel it, you will need to take extra care to avoid injury. Because it may be weak, you may have difficulty moving it or using it. You may not know what position it  is in without looking at it while the block is in effect.  3. For blocks in the legs and feet, returning to weight bearing and walking needs to be done carefully. You will need to wait until the numbness is entirely gone and the strength has returned. You should be able to move your leg and foot normally before you try and bear weight or walk. You will need someone to be with you when you first try to ensure you do not fall and possibly risk  injury.  4. Bruising and tenderness at the needle site are common side effects and will resolve in a few days.  5. Persistent numbness or new problems with movement should be communicated to the surgeon or the [facility] [phone])/ [facility] [phone]  Information for Discharge Teaching: EXPAREL (bupivacaine liposome injectable suspension)   Your surgeon or anesthesiologist gave you EXPAREL(bupivacaine) to help control your pain after surgery.  EXPAREL is a local anesthetic that provides pain relief by numbing the tissue around the surgical site. EXPAREL is designed to release pain medication over time and can control pain for up to 72 hours. Depending on how you respond to EXPAREL, you may require less pain medication during your recovery.  Possible side effects: Temporary loss of sensation or ability to move in the area where bupivacaine was injected. Nausea, vomiting, constipation Rarely, numbness and tingling in your mouth or lips, lightheadedness, or anxiety may occur. Call your doctor right away if you think you may be experiencing any of these sensations, or if you have other questions regarding possible side effects.  Follow all other discharge instructions given to you by your surgeon or nurse. Eat a healthy diet and drink plenty of water or other fluids.  If you return to the hospital for any reason within 96 hours following the administration of EXPAREL, it is important for health care providers to know that you have received this anesthetic. A teal colored band has been placed on your arm with the date, time and amount of EXPAREL you have received in order to alert and inform your health care providers. Please leave this armband in place for the full 96 hours following administration, and then you may remove the band.Information for Discharge Teaching: EXPAREL (bupivacaine liposome injectable suspension)   Your surgeon or anesthesiologist gave  you EXPAREL(bupivacaine) to help control your pain after surgery.  EXPAREL is a local anesthetic that provides pain relief by numbing the tissue around the surgical site. EXPAREL is designed to release pain medication over time and can control pain for up to 72 hours. Depending on how you respond to EXPAREL, you may require less pain medication during your recovery.  Possible side effects: Temporary loss of sensation or ability to move in the area where bupivacaine was injected. Nausea, vomiting, constipation Rarely, numbness and tingling in your mouth or lips, lightheadedness, or anxiety may occur. Call your doctor right away if you think you may be experiencing any of these sensations, or if you have other questions regarding possible side effects.  Follow all other discharge instructions given to you by your surgeon or nurse. Eat a healthy diet and drink plenty of water or other fluids.  If you return to the hospital for any reason within 96 hours following the administration of EXPAREL, it is important for health care providers to know that you have received this anesthetic. A teal colored band has been placed on your arm with the date, time and  amount of EXPAREL you have received in order to alert and inform your health care providers. Please leave this armband in place for the full 96 hours following administration, and then you may remove the band.   Next dose of Tylenol may be given at 2:00pm if needed. Next dose of NSAIDS (Ibuprofen/Motrin/Aleve) may be given at 4:00pm if needed.  Diet: As you were doing prior to hospitalization   Shower/dressing:  May shower but keep the wounds dry, use an occlusive plastic wrap, NO SOAKING IN TUB.  If the bandage gets wet, change with a clean dry gauze. You may remove your dressing 3 days after surgery.Tthere are sticky tapes (steri-strips) on your wounds and all the stitches are absorbable.  Leave the steri-strips in place until your follow-up  appointment, they will peel off with time, usually 2-3 weeks.  Activity:  Increase activity slowly as tolerated, but follow the weight bearing instructions below.  The rules on driving is that you can not be taking narcotics while you drive, and you must feel in control of the vehicle.    Weight Bearing:   Ok to weight bear as tolerated. Sling is only needed for comfort. Ok for unrestricted motion at the shoulder, elbow, wrist, and hand.   To prevent constipation: you may use a stool softener such as -  Colace (over the counter) 100 mg by mouth twice a day  Drink plenty of fluids (prune juice may be helpful) and high fiber foods Miralax (over the counter) for constipation as needed.    Itching:  If you experience itching with your medications, try taking only a single pain pill, or even half a pain pill at a time.  You may take up to 10 pain pills per day, and you can also use benadryl over the counter for itching or also to help with sleep.   Precautions:  If you experience chest pain or shortness of breath - call 911 immediately for transfer to the hospital emergency department!!  If you develop a fever greater that 101 F, purulent drainage from wound, increased redness or drainage from wound, or calf pain -- Call the office at [phone]                                                Follow- Up Appointment:  Please call for an appointment to be seen in 2 weeks [place] - [phone]

## 2022-05-13 NOTE — Anesthesia Postprocedure Evaluation (Signed)
Anesthesia Post Note  Patient: Kristin Rasmussen  Procedure(s) Performed: SHOULDER ARTHROSCOPY WITH DISTAL CLAVICLE EXCISION (Right: Shoulder) SHOULDER ACROMIOPLASTY (Right: Shoulder)     Patient location during evaluation: PACU Anesthesia Type: General Level of consciousness: awake and alert Pain management: pain level controlled Vital Signs Assessment: post-procedure vital signs reviewed and stable Respiratory status: spontaneous breathing, nonlabored ventilation, respiratory function stable and patient connected to nasal cannula oxygen Cardiovascular status: blood pressure returned to baseline and stable Postop Assessment: no apparent nausea or vomiting Anesthetic complications: no   No notable events documented.  Last Vitals:  Vitals:   05/13/22 0909 05/13/22 0915  BP:  123/60  Pulse: 80 79  Resp: 15 11  Temp:    SpO2: 96% 97%    Last Pain:  Vitals:   05/13/22 0758  TempSrc: Oral  PainSc: 4                  Vanna Shavers

## 2022-05-14 ENCOUNTER — Encounter (HOSPITAL_BASED_OUTPATIENT_CLINIC_OR_DEPARTMENT_OTHER): Payer: Self-pay | Admitting: Orthopedic Surgery

## 2022-05-14 NOTE — Addendum Note (Signed)
Addendum  created 05/14/22 1634 by Bethena Midget, MD   Clinical Note Signed, Intraprocedure Blocks edited

## 2022-05-27 ENCOUNTER — Encounter: Payer: No Typology Code available for payment source | Admitting: Internal Medicine

## 2022-06-04 ENCOUNTER — Ambulatory Visit: Payer: PRIVATE HEALTH INSURANCE | Attending: Orthopedic Surgery

## 2022-06-04 ENCOUNTER — Other Ambulatory Visit: Payer: Self-pay

## 2022-06-04 ENCOUNTER — Ambulatory Visit (AMBULATORY_SURGERY_CENTER): Payer: No Typology Code available for payment source

## 2022-06-04 VITALS — Ht 63.5 in | Wt 212.0 lb

## 2022-06-04 DIAGNOSIS — M25611 Stiffness of right shoulder, not elsewhere classified: Secondary | ICD-10-CM | POA: Diagnosis present

## 2022-06-04 DIAGNOSIS — M25511 Pain in right shoulder: Secondary | ICD-10-CM | POA: Diagnosis present

## 2022-06-04 DIAGNOSIS — Z1211 Encounter for screening for malignant neoplasm of colon: Secondary | ICD-10-CM

## 2022-06-04 MED ORDER — NA SULFATE-K SULFATE-MG SULF 17.5-3.13-1.6 GM/177ML PO SOLN
1.0000 | Freq: Once | ORAL | 0 refills | Status: AC
Start: 2022-06-04 — End: 2022-06-04

## 2022-06-04 NOTE — Therapy (Signed)
OUTPATIENT PHYSICAL THERAPY SHOULDER EVALUATION   Patient Name: Kristin Rasmussen MRN: [id number] DOB:07-30-1960, 62 y.o., female Today's Date: 06/04/2022  END OF SESSION:  PT End of Session - 06/04/22 1040     Visit Number 1    Number of Visits 12    Date for PT Re-Evaluation 08/01/22    PT Start Time 1042    PT Stop Time 1115    PT Time Calculation (min) 33 min    Activity Tolerance Patient tolerated treatment well    Behavior During Therapy [facility] for tasks assessed/performed             Past Medical History:  Diagnosis Date   Anemia 2004   HCT 34.7   Anxiety    follows with psyc for same   Back pain    Basal cell cancer    x3; Dr Danella Deis   Bladder leak    Chest pain 06/2010   Costochondritis   Diverticulosis    Gallbladder problem    GERD (gastroesophageal reflux disease)    Hyperlipidemia    IBS (irritable bowel syndrome)    Melanoma (HCC) 2006   x1   PCOS (polycystic ovarian syndrome)    Plantar fasciitis 08/13/2011   Left is significant and RT is mild by [place] criteria    Spasm of esophagus    Swelling    Thrombocytopenia (HCC) 2002   133,000 platelets   Past Surgical History:  Procedure Laterality Date   CATARACT EXTRACTION W/PHACO Right 05/13/2021   Procedure: CATARACT EXTRACTION PHACO AND INTRAOCULAR LENS PLACEMENT (IOC);  Surgeon: Fabio Pierce, MD;  Location: [facility];  Service: Ophthalmology;  Laterality: Right;  CDE: 8.14    CATARACT EXTRACTION W/PHACO Left 05/27/2021   Procedure: CATARACT EXTRACTION PHACO AND INTRAOCULAR LENS PLACEMENT (IOC);  Surgeon: Fabio Pierce, MD;  Location: [facility];  Service: Ophthalmology;  Laterality: Left;  CDE: 6.55   CHOLECYSTECTOMY  2002   [facility]   COLONOSCOPY     MASS EXCISION Right 10/22/2021   Procedure: EXCISION MASS, RIGHT FOOT;  Surgeon: Teryl Lucy, MD;  Location: [facility];  Service: Orthopedics;  Laterality: Right;   MELANOMA EXCISION     LUE 2006  [facility]; Derm : Dr Campbell Stall   SHOULDER  ACROMIOPLASTY Right 05/13/2022   Procedure: SHOULDER ACROMIOPLASTY;  Surgeon: Teryl Lucy, MD;  Location: [facility];  Service: Orthopedics;  Laterality: Right;   SHOULDER ARTHROSCOPY WITH DISTAL CLAVICLE RESECTION Right 05/13/2022   Procedure: SHOULDER ARTHROSCOPY WITH DISTAL CLAVICLE EXCISION;  Surgeon: Teryl Lucy, MD;  Location: [facility];  Service: Orthopedics;  Laterality: Right;   SHOULDER SURGERY     Left   TONSILLECTOMY     TOTAL ABDOMINAL HYSTERECTOMY  1991   abnormal PAP hx   Patient Active Problem List   Diagnosis Date Noted   Hair loss 12/20/2020   Prediabetes 12/24/2019   Obesity (BMI 30-39.9) 12/20/2019   Overactive bladder 12/09/2018   Menopausal symptom 11/02/2018   Right wrist pain 12/07/2017   Insulin resistance 10/08/2016   Vitamin D deficiency 09/15/2016   Osteopenia 04/19/2015   Chronic lower back pain 09/19/2014   Anxiety    Metatarsalgia 08/13/2011   DIVERTICULOSIS, COLON 01/20/2008   SKIN CANCER, HX OF 01/20/2008   History of melanoma in situ 10/28/2006   HYPERLIPIDEMIA 10/28/2006   GERD 10/28/2006    PCP: Pincus Sanes, MD  REFERRING PROVIDER: Teryl Lucy, MD  REFERRING DIAG: S/P right shoulder scope, acromioplasty, distal clavicle excision, trephination  for calcific tendinitis  THERAPY DIAG:  Acute pain of right shoulder  Stiffness of right shoulder, not elsewhere classified  Rationale for Evaluation and Treatment: Rehabilitation  ONSET DATE: 05/13/22  SUBJECTIVE:                                                                                                                                                                                      SUBJECTIVE STATEMENT: Patient reports that she had surgery on her right shoulder on 05/13/22. She notes that she was cleared to start doing some light movement with her arm. She notes that her shoulder is primarily tender and sore. She has been sleeping in the recliner at  home to avoid rolling over onto her right shoulder. She was told of any restrictions or limitations.  Hand dominance: Right  PERTINENT HISTORY: Allergies, osteopenia, anxiety, chronic back pain, and history of melanoma  PAIN:  Are you having pain? Yes: NPRS scale: 10/10 Pain location: right shoulder Pain description: sore and tender Aggravating factors: using her arm Relieving factors: medication and heat  PRECAUTIONS: None  WEIGHT BEARING RESTRICTIONS: No  FALLS:  Has patient fallen in last 6 months? No  LIVING ENVIRONMENT: Lives with: lives with their spouse Lives in: House/apartment  OCCUPATION: Retired  PLOF: Independent  PATIENT GOALS: improved use of her arm, be able to do yard work, be able to shower and dry her hair  NEXT MD VISIT: 06/23/22  OBJECTIVE:   PATIENT SURVEYS:  FOTO 40.62  COGNITION: Overall cognitive status: Within functional limits for tasks assessed     SENSATION: Patient reports rare tingling around her right shoulder  POSTURE: Rounded shoulders   UPPER EXTREMITY ROM:   Active ROM Right eval Left eval  Shoulder flexion 94/ 128 (PROM); tight and sore 143  Shoulder extension    Shoulder abduction 84/ 76 (PROM) limited by pain 134  Shoulder adduction    Shoulder internal rotation  To T4  Shoulder external rotation 25 (PROM at neutral)  To T2  Elbow flexion    Elbow extension    Wrist flexion    Wrist extension    Wrist ulnar deviation    Wrist radial deviation    Wrist pronation    Wrist supination    (Blank rows = not tested)  UPPER EXTREMITY MMT: not tested due to surgical condition  SHOULDER SPECIAL TESTS: Not tested due to surgical condition  JOINT MOBILITY TESTING:  Right AC joint: hypomobile and painful Right glenohumeral: hypomobile and painful  PALPATION:  TTP: right upper trapezius, infraspinatus, supraspinatus, pectoralis minor, and deltoid   TODAY'S TREATMENT:  DATE:                                    5/1 EXERCISE LOG  Exercise Repetitions and Resistance Comments  Upper trapezius stretch 3 x 30 seconds For right upper trapezius  Pendulums (AP and lateral) 20 reps each                Blank cell = exercise not performed today   PATIENT EDUCATION: Education details: Plan of care, healing, prognosis, HEP, and goals for therapy Person educated: Patient and Spouse Education method: Explanation Education comprehension: verbalized understanding  HOME EXERCISE PROGRAM: Today's interventions were added to her HEP.  She was encouraged to perform these interventions at least twice per day and she reported understanding.  ASSESSMENT:  CLINICAL IMPRESSION: Patient is a 62 y.o. female who was seen today for physical therapy evaluation and treatment following a right shoulder scope on 05/13/22.  She presented with moderate to high pain severity and irritability with right shoulder active and passive range of motion reproducing her familiar symptoms.  She was provided an updated HEP which she was able to properly demonstrate.  Recommend that she continue with skilled physical therapy to address her impairments to return to her prior level of function.  OBJECTIVE IMPAIRMENTS: decreased activity tolerance, decreased knowledge of condition, decreased ROM, decreased strength, hypomobility, impaired perceived functional ability, impaired tone, impaired UE functional use, and pain.   ACTIVITY LIMITATIONS: carrying, lifting, sleeping, reach over head, and hygiene/grooming  PARTICIPATION LIMITATIONS: meal prep, cleaning, laundry, driving, shopping, community activity, and yard work  PERSONAL FACTORS: 3+ comorbidities: Allergies, osteopenia, anxiety, chronic back pain, and history of melanoma  are also affecting patient's functional outcome.   REHAB POTENTIAL: Good  CLINICAL  DECISION MAKING: Evolving/moderate complexity  EVALUATION COMPLEXITY: Moderate   GOALS: Goals reviewed with patient? Yes  SHORT TERM GOALS: Target date: 06/25/22  Patient will be independent with her initial HEP. Baseline: Goal status: INITIAL  2.  Patient will be able to complete her daily activities without her familiar pain exceeding 8/10. Baseline:  Goal status: INITIAL  3.  Patient will report being able to sleep in her bed without being awakened by her familiar right shoulder pain. Baseline:  Goal status: INITIAL  4.  Patient will be able to demonstrate at least 110 degrees of right shoulder flexion for improved function reaching overhead. Baseline:  Goal status: INITIAL  5.  Patient will be able to demonstrate at least 110 degrees of right shoulder abduction for improved function reaching. Baseline:  Goal status: INITIAL  LONG TERM GOALS: Target date: 07/16/22  Patient will be independent with her advanced HEP.  Baseline:  Goal status: INITIAL  2.  Patient will be able to complete her daily activities without her familiar pain exceeding 6/10. Baseline:  Goal status: INITIAL  3.  Patient will be able to demonstrate at least 130 degrees of right shoulder flexion for improved function reaching overhead. Baseline:  Goal status: INITIAL  4.  Patient will report being able to wash her back with her right upper extremity without needing assistance from her husband. Baseline:  Goal status: INITIAL  5.  Patient will be able to demonstrate at least 120 degrees of right shoulder abduction for improved function with overhead activity. Baseline:  Goal status: INITIAL  PLAN:  PT FREQUENCY: 2x/week  PT DURATION: 6 weeks  PLANNED INTERVENTIONS: Therapeutic exercises, Therapeutic activity, Neuromuscular re-education,  Patient/Family education, Self Care, Joint mobilization, Electrical stimulation, Cryotherapy, Moist heat, Vasopneumatic device, Manual therapy, and  Re-evaluation  PLAN FOR NEXT SESSION: Pulleys, isometrics, active assisted range of motion, passive range of motion, and modalities as needed   Granville Lewis, PT 06/04/2022, 3:32 PM

## 2022-06-04 NOTE — Progress Notes (Signed)
No egg or soy allergy known to patient  No issues known to pt with past sedation with any surgeries or procedures Patient denies ever being told they had issues or difficulty with intubation  No FH of Malignant Hyperthermia Pt is not on diet pills Pt is not on  home 02  Pt is not on blood thinners  Pt denies issues with constipation  No A fib or A flutter Have any cardiac testing pending--no Pt instructed to use [email or web address] or GoodRx for a price reduction on prep  Patient's chart reviewed by Cathlyn Parsons CNRA prior to previsit and patient appropriate for the [facility].  Previsit completed and red dot placed by patient's name on their procedure day (on provider's schedule).   Can ambulate with no assistance

## 2022-06-06 ENCOUNTER — Ambulatory Visit: Payer: PRIVATE HEALTH INSURANCE

## 2022-06-06 DIAGNOSIS — M25511 Pain in right shoulder: Secondary | ICD-10-CM

## 2022-06-06 DIAGNOSIS — M25611 Stiffness of right shoulder, not elsewhere classified: Secondary | ICD-10-CM

## 2022-06-06 NOTE — Therapy (Signed)
OUTPATIENT PHYSICAL THERAPY SHOULDER TREATMENT   Patient Name: Kristin Rasmussen MRN: [id number] DOB:1960/08/04, 62 y.o., female Today's Date: 06/06/2022  END OF SESSION:  PT End of Session - 06/06/22 0904     Visit Number 2    Number of Visits 12    Date for PT Re-Evaluation 08/01/22    PT Start Time 0815    Activity Tolerance Patient tolerated treatment well    Behavior During Therapy [facility] for tasks assessed/performed             Past Medical History:  Diagnosis Date   Anemia 2004   HCT 34.7   Anxiety    follows with psyc for same   Back pain    Basal cell cancer    x3; Dr Danella Deis   Bladder leak    Chest pain 06/2010   Costochondritis   Diverticulosis    Gallbladder problem    GERD (gastroesophageal reflux disease)    Hyperlipidemia    IBS (irritable bowel syndrome)    Melanoma (HCC) 2006   x1   PCOS (polycystic ovarian syndrome)    Plantar fasciitis 08/13/2011   Left is significant and RT is mild by [place] criteria    Spasm of esophagus    Swelling    Thrombocytopenia (HCC) 2002   133,000 platelets   Past Surgical History:  Procedure Laterality Date   CATARACT EXTRACTION W/PHACO Right 05/13/2021   Procedure: CATARACT EXTRACTION PHACO AND INTRAOCULAR LENS PLACEMENT (IOC);  Surgeon: Fabio Pierce, MD;  Location: [facility];  Service: Ophthalmology;  Laterality: Right;  CDE: 8.14    CATARACT EXTRACTION W/PHACO Left 05/27/2021   Procedure: CATARACT EXTRACTION PHACO AND INTRAOCULAR LENS PLACEMENT (IOC);  Surgeon: Fabio Pierce, MD;  Location: [facility];  Service: Ophthalmology;  Laterality: Left;  CDE: 6.55   CHOLECYSTECTOMY  2002   CCS   COLONOSCOPY     MASS EXCISION Right 10/22/2021   Procedure: EXCISION MASS, RIGHT FOOT;  Surgeon: Teryl Lucy, MD;  Location: [facility];  Service: Orthopedics;  Laterality: Right;   MELANOMA EXCISION     LUE 2006  CCS; Derm : Dr Campbell Stall   SHOULDER ACROMIOPLASTY Right 05/13/2022   Procedure: SHOULDER ACROMIOPLASTY;   Surgeon: Teryl Lucy, MD;  Location: [facility];  Service: Orthopedics;  Laterality: Right;   SHOULDER ARTHROSCOPY WITH DISTAL CLAVICLE RESECTION Right 05/13/2022   Procedure: SHOULDER ARTHROSCOPY WITH DISTAL CLAVICLE EXCISION;  Surgeon: Teryl Lucy, MD;  Location: [facility];  Service: Orthopedics;  Laterality: Right;   SHOULDER SURGERY     Left   TONSILLECTOMY     TOTAL ABDOMINAL HYSTERECTOMY  1991   abnormal PAP hx   Patient Active Problem List   Diagnosis Date Noted   Hair loss 12/20/2020   Prediabetes 12/24/2019   Obesity (BMI 30-39.9) 12/20/2019   Overactive bladder 12/09/2018   Menopausal symptom 11/02/2018   Right wrist pain 12/07/2017   Insulin resistance 10/08/2016   Vitamin D deficiency 09/15/2016   Osteopenia 04/19/2015   Chronic lower back pain 09/19/2014   Anxiety    Metatarsalgia 08/13/2011   DIVERTICULOSIS, COLON 01/20/2008   SKIN CANCER, HX OF 01/20/2008   History of melanoma in situ 10/28/2006   HYPERLIPIDEMIA 10/28/2006   GERD 10/28/2006    PCP: Pincus Sanes, MD  REFERRING PROVIDER: Teryl Lucy, MD  REFERRING DIAG: S/P right shoulder scope, acromioplasty, distal clavicle excision, trephination for calcific tendinitis  THERAPY DIAG:  Acute pain of right shoulder  Stiffness of right  shoulder, not elsewhere classified  Rationale for Evaluation and Treatment: Rehabilitation  ONSET DATE: 05/13/22  SUBJECTIVE:                                                                                                                                                                                      SUBJECTIVE STATEMENT: Pt reports 3/10 right shoulder pain. Hand dominance: Right  PERTINENT HISTORY: Allergies, osteopenia, anxiety, chronic back pain, and history of melanoma  PAIN:  Are you having pain? Yes: NPRS scale: 3/10 Pain location: right shoulder Pain description: sore and tender Aggravating factors: using her  arm Relieving factors: medication and heat  PRECAUTIONS: None  WEIGHT BEARING RESTRICTIONS: No  FALLS:  Has patient fallen in last 6 months? No  LIVING ENVIRONMENT: Lives with: lives with their spouse Lives in: House/apartment  OCCUPATION: Retired  PLOF: Independent  PATIENT GOALS: improved use of her arm, be able to do yard work, be able to shower and dry her hair  NEXT MD VISIT: 06/23/22  OBJECTIVE:   PATIENT SURVEYS:  FOTO 40.62  COGNITION: Overall cognitive status: Within functional limits for tasks assessed     SENSATION: Patient reports rare tingling around her right shoulder  POSTURE: Rounded shoulders   UPPER EXTREMITY ROM:   Active ROM Right eval Left eval  Shoulder flexion 94/ 128 (PROM); tight and sore 143  Shoulder extension    Shoulder abduction 84/ 76 (PROM) limited by pain 134  Shoulder adduction    Shoulder internal rotation  To T4  Shoulder external rotation 25 (PROM at neutral)  To T2  Elbow flexion    Elbow extension    Wrist flexion    Wrist extension    Wrist ulnar deviation    Wrist radial deviation    Wrist pronation    Wrist supination    (Blank rows = not tested)  UPPER EXTREMITY MMT: not tested due to surgical condition  SHOULDER SPECIAL TESTS: Not tested due to surgical condition  JOINT MOBILITY TESTING:  Right AC joint: hypomobile and painful Right glenohumeral: hypomobile and painful  PALPATION:  TTP: right upper trapezius, infraspinatus, supraspinatus, pectoralis minor, and deltoid   TODAY'S TREATMENT:  DATE:                                    5/3 EXERCISE LOG  Exercise Repetitions and Resistance Comments  UBE X6 mins   Ranger Flexion/extension; CW; CCW circles x 3 mins each   Wall Ladder X5 reps(max    Doorway Stretch 30 secs x 3 reps        Blank cell = exercise not  performed today   Manual Therapy Soft Tissue Mobilization: right shoulder, STW/M to right upper trap and cervical paraspinals to decrease pain and tone    Modalities  Date:  Unattended Estim: Shoulder, IFC 80-150 Hz, 15 mins, Pain Hot Pack: Shoulder, 15 mins, Pain and Tone  PATIENT EDUCATION: Education details: Plan of care, healing, prognosis, HEP, and goals for therapy Person educated: Patient and Spouse Education method: Explanation Education comprehension: verbalized understanding  HOME EXERCISE PROGRAM: Today's interventions were added to her HEP.  She was encouraged to perform these interventions at least twice per day and she reported understanding.  ASSESSMENT:  CLINICAL IMPRESSION: Pt arrives for today's treatment session reporting 3/10 right shoulder pain.  Pt instructed in various standing and seated right shoulder exercises to increase strength, function, and ROM.  Pt requiring min cues for proper technique with all newly added exercises.  STW/M performed to right upper trap and cervical paraspinals to decrease pain and tone. Pt reported decreased pain at completion of today's treatment session.  OBJECTIVE IMPAIRMENTS: decreased activity tolerance, decreased knowledge of condition, decreased ROM, decreased strength, hypomobility, impaired perceived functional ability, impaired tone, impaired UE functional use, and pain.   ACTIVITY LIMITATIONS: carrying, lifting, sleeping, reach over head, and hygiene/grooming  PARTICIPATION LIMITATIONS: meal prep, cleaning, laundry, driving, shopping, community activity, and yard work  PERSONAL FACTORS: 3+ comorbidities: Allergies, osteopenia, anxiety, chronic back pain, and history of melanoma  are also affecting patient's functional outcome.   REHAB POTENTIAL: Good  CLINICAL DECISION MAKING: Evolving/moderate complexity  EVALUATION COMPLEXITY: Moderate   GOALS: Goals reviewed with patient? Yes  SHORT TERM GOALS: Target date:  06/25/22  Patient will be independent with her initial HEP. Baseline: Goal status: INITIAL  2.  Patient will be able to complete her daily activities without her familiar pain exceeding 8/10. Baseline:  Goal status: INITIAL  3.  Patient will report being able to sleep in her bed without being awakened by her familiar right shoulder pain. Baseline:  Goal status: INITIAL  4.  Patient will be able to demonstrate at least 110 degrees of right shoulder flexion for improved function reaching overhead. Baseline:  Goal status: INITIAL  5.  Patient will be able to demonstrate at least 110 degrees of right shoulder abduction for improved function reaching. Baseline:  Goal status: INITIAL  LONG TERM GOALS: Target date: 07/16/22  Patient will be independent with her advanced HEP.  Baseline:  Goal status: INITIAL  2.  Patient will be able to complete her daily activities without her familiar pain exceeding 6/10. Baseline:  Goal status: INITIAL  3.  Patient will be able to demonstrate at least 130 degrees of right shoulder flexion for improved function reaching overhead. Baseline:  Goal status: INITIAL  4.  Patient will report being able to wash her back with her right upper extremity without needing assistance from her husband. Baseline:  Goal status: INITIAL  5.  Patient will be able to demonstrate at least 120 degrees of right  shoulder abduction for improved function with overhead activity. Baseline:  Goal status: INITIAL  PLAN:  PT FREQUENCY: 2x/week  PT DURATION: 6 weeks  PLANNED INTERVENTIONS: Therapeutic exercises, Therapeutic activity, Neuromuscular re-education, Patient/Family education, Self Care, Joint mobilization, Electrical stimulation, Cryotherapy, Moist heat, Vasopneumatic device, Manual therapy, and Re-evaluation  PLAN FOR NEXT SESSION: Pulleys, isometrics, active assisted range of motion, passive range of motion, and modalities as needed   Newman Pies,  PTA 06/06/2022, 9:52 AM

## 2022-06-09 ENCOUNTER — Ambulatory Visit: Payer: No Typology Code available for payment source

## 2022-06-09 DIAGNOSIS — M25511 Pain in right shoulder: Secondary | ICD-10-CM

## 2022-06-09 DIAGNOSIS — M25611 Stiffness of right shoulder, not elsewhere classified: Secondary | ICD-10-CM

## 2022-06-09 NOTE — Therapy (Signed)
OUTPATIENT PHYSICAL THERAPY SHOULDER TREATMENT   Patient Name: Kristin Rasmussen MRN: [id number] DOB:February 20, 1960, 62 y.o., female Today's Date: 06/09/2022  END OF SESSION:  PT End of Session - 06/09/22 0947     Visit Number 3    Number of Visits 12    Date for PT Re-Evaluation 08/01/22    PT Start Time 0945    PT Stop Time 1040    PT Time Calculation (min) 55 min    Activity Tolerance Patient tolerated treatment well    Behavior During Therapy [facility] for tasks assessed/performed             Past Medical History:  Diagnosis Date   Anemia 2004   HCT 34.7   Anxiety    follows with psyc for same   Back pain    Basal cell cancer    x3; Dr Danella Deis   Bladder leak    Chest pain 06/2010   Costochondritis   Diverticulosis    Gallbladder problem    GERD (gastroesophageal reflux disease)    Hyperlipidemia    IBS (irritable bowel syndrome)    Melanoma (HCC) 2006   x1   PCOS (polycystic ovarian syndrome)    Plantar fasciitis 08/13/2011   Left is significant and RT is mild by [place] criteria    Spasm of esophagus    Swelling    Thrombocytopenia (HCC) 2002   133,000 platelets   Past Surgical History:  Procedure Laterality Date   CATARACT EXTRACTION W/PHACO Right 05/13/2021   Procedure: CATARACT EXTRACTION PHACO AND INTRAOCULAR LENS PLACEMENT (IOC);  Surgeon: Fabio Pierce, MD;  Location: [facility];  Service: Ophthalmology;  Laterality: Right;  CDE: 8.14    CATARACT EXTRACTION W/PHACO Left 05/27/2021   Procedure: CATARACT EXTRACTION PHACO AND INTRAOCULAR LENS PLACEMENT (IOC);  Surgeon: Fabio Pierce, MD;  Location: [facility];  Service: Ophthalmology;  Laterality: Left;  CDE: 6.55   CHOLECYSTECTOMY  2002   CCS   COLONOSCOPY     MASS EXCISION Right 10/22/2021   Procedure: EXCISION MASS, RIGHT FOOT;  Surgeon: Teryl Lucy, MD;  Location: [facility];  Service: Orthopedics;  Laterality: Right;   MELANOMA EXCISION     LUE 2006  CCS; Derm : Dr Campbell Stall   SHOULDER  ACROMIOPLASTY Right 05/13/2022   Procedure: SHOULDER ACROMIOPLASTY;  Surgeon: Teryl Lucy, MD;  Location: [facility];  Service: Orthopedics;  Laterality: Right;   SHOULDER ARTHROSCOPY WITH DISTAL CLAVICLE RESECTION Right 05/13/2022   Procedure: SHOULDER ARTHROSCOPY WITH DISTAL CLAVICLE EXCISION;  Surgeon: Teryl Lucy, MD;  Location: [facility];  Service: Orthopedics;  Laterality: Right;   SHOULDER SURGERY     Left   TONSILLECTOMY     TOTAL ABDOMINAL HYSTERECTOMY  1991   abnormal PAP hx   Patient Active Problem List   Diagnosis Date Noted   Hair loss 12/20/2020   Prediabetes 12/24/2019   Obesity (BMI 30-39.9) 12/20/2019   Overactive bladder 12/09/2018   Menopausal symptom 11/02/2018   Right wrist pain 12/07/2017   Insulin resistance 10/08/2016   Vitamin D deficiency 09/15/2016   Osteopenia 04/19/2015   Chronic lower back pain 09/19/2014   Anxiety    Metatarsalgia 08/13/2011   DIVERTICULOSIS, COLON 01/20/2008   SKIN CANCER, HX OF 01/20/2008   History of melanoma in situ 10/28/2006   HYPERLIPIDEMIA 10/28/2006   GERD 10/28/2006    PCP: Pincus Sanes, MD  REFERRING PROVIDER: Teryl Lucy, MD  REFERRING DIAG: S/P right shoulder scope, acromioplasty, distal clavicle excision, trephination  for calcific tendinitis  THERAPY DIAG:  Acute pain of right shoulder  Stiffness of right shoulder, not elsewhere classified  Rationale for Evaluation and Treatment: Rehabilitation  ONSET DATE: 05/13/22  SUBJECTIVE:                                                                                                                                                                                      SUBJECTIVE STATEMENT: Pt reports 3/10 right shoulder pain.  Pt reported slight increase in soreness after last treatment session, but not too bad.    Hand dominance: Right  PERTINENT HISTORY: Allergies, osteopenia, anxiety, chronic back pain, and history of  melanoma  PAIN:  Are you having pain? Yes: NPRS scale: 3/10 Pain location: right shoulder Pain description: sore and tender Aggravating factors: using her arm Relieving factors: medication and heat  PRECAUTIONS: None  WEIGHT BEARING RESTRICTIONS: No  FALLS:  Has patient fallen in last 6 months? No  LIVING ENVIRONMENT: Lives with: lives with their spouse Lives in: House/apartment  OCCUPATION: Retired  PLOF: Independent  PATIENT GOALS: improved use of her arm, be able to do yard work, be able to shower and dry her hair  NEXT MD VISIT: 06/23/22  OBJECTIVE:   PATIENT SURVEYS:  FOTO 40.62  COGNITION: Overall cognitive status: Within functional limits for tasks assessed     SENSATION: Patient reports rare tingling around her right shoulder  POSTURE: Rounded shoulders   UPPER EXTREMITY ROM:   Active ROM Right eval Left eval  Shoulder flexion 94/ 128 (PROM); tight and sore 143  Shoulder extension    Shoulder abduction 84/ 76 (PROM) limited by pain 134  Shoulder adduction    Shoulder internal rotation  To T4  Shoulder external rotation 25 (PROM at neutral)  To T2  Elbow flexion    Elbow extension    Wrist flexion    Wrist extension    Wrist ulnar deviation    Wrist radial deviation    Wrist pronation    Wrist supination    (Blank rows = not tested)  UPPER EXTREMITY MMT: not tested due to surgical condition  SHOULDER SPECIAL TESTS: Not tested due to surgical condition  JOINT MOBILITY TESTING:  Right AC joint: hypomobile and painful Right glenohumeral: hypomobile and painful  PALPATION:  TTP: right upper trapezius, infraspinatus, supraspinatus, pectoralis minor, and deltoid   TODAY'S TREATMENT:  DATE:                                    5/6 EXERCISE LOG  Exercise Repetitions and Resistance Comments  UBE x6 mins    Ranger Flexion/extension; CW; CCW circles x 3 mins each   Wall Ladder x5 reps (max 22)   Doorway Stretch 30 secs x 3 reps   Wall Push Ups x10 reps     Blank cell = exercise not performed today   Manual Therapy Soft Tissue Mobilization: right shoulder, STW/M to right upper trap and cervical paraspinals to decrease pain and tone    Modalities  Date:  Unattended Estim: Shoulder, IFC 80-150 Hz, 15 mins, Pain Hot Pack: Shoulder, 15 mins, Pain and Tone  PATIENT EDUCATION: Education details: Plan of care, healing, prognosis, HEP, and goals for therapy Person educated: Patient and Spouse Education method: Explanation Education comprehension: verbalized understanding  HOME EXERCISE PROGRAM: Today's interventions were added to her HEP.  She was encouraged to perform these interventions at least twice per day and she reported understanding.  ASSESSMENT:  CLINICAL IMPRESSION: Pt arrives for today's treatment session reporting 3/10 right shoulder pain.  Pt able to increase max number on wall ladder to 22 today with ease.  Pt able to tolerate introduction to wall push ups today with min cues for proper technique.  STW/M to right upper trap and levator scap to decrease pain and tone.  Normal responses to estim and MH noted upon removal.  Pt given hand out to purchase personal TENS unit.  Pt encouraged to bring unit into the office when she receives it.  Pt reported decreased pain at completion of today's treatment session.  OBJECTIVE IMPAIRMENTS: decreased activity tolerance, decreased knowledge of condition, decreased ROM, decreased strength, hypomobility, impaired perceived functional ability, impaired tone, impaired UE functional use, and pain.   ACTIVITY LIMITATIONS: carrying, lifting, sleeping, reach over head, and hygiene/grooming  PARTICIPATION LIMITATIONS: meal prep, cleaning, laundry, driving, shopping, community activity, and yard work  PERSONAL FACTORS: 3+ comorbidities: Allergies,  osteopenia, anxiety, chronic back pain, and history of melanoma  are also affecting patient's functional outcome.   REHAB POTENTIAL: Good  CLINICAL DECISION MAKING: Evolving/moderate complexity  EVALUATION COMPLEXITY: Moderate   GOALS: Goals reviewed with patient? Yes  SHORT TERM GOALS: Target date: 06/25/22  Patient will be independent with her initial HEP. Baseline: Goal status: INITIAL  2.  Patient will be able to complete her daily activities without her familiar pain exceeding 8/10. Baseline:  Goal status: INITIAL  3.  Patient will report being able to sleep in her bed without being awakened by her familiar right shoulder pain. Baseline:  Goal status: INITIAL  4.  Patient will be able to demonstrate at least 110 degrees of right shoulder flexion for improved function reaching overhead. Baseline:  Goal status: INITIAL  5.  Patient will be able to demonstrate at least 110 degrees of right shoulder abduction for improved function reaching. Baseline:  Goal status: INITIAL  LONG TERM GOALS: Target date: 07/16/22  Patient will be independent with her advanced HEP.  Baseline:  Goal status: INITIAL  2.  Patient will be able to complete her daily activities without her familiar pain exceeding 6/10. Baseline:  Goal status: INITIAL  3.  Patient will be able to demonstrate at least 130 degrees of right shoulder flexion for improved function reaching overhead. Baseline:  Goal status: INITIAL  4.  Patient will report being able to wash her back with her right upper extremity without needing assistance from her husband. Baseline:  Goal status: INITIAL  5.  Patient will be able to demonstrate at least 120 degrees of right shoulder abduction for improved function with overhead activity. Baseline:  Goal status: INITIAL  PLAN:  PT FREQUENCY: 2x/week  PT DURATION: 6 weeks  PLANNED INTERVENTIONS: Therapeutic exercises, Therapeutic activity, Neuromuscular re-education,  Patient/Family education, Self Care, Joint mobilization, Electrical stimulation, Cryotherapy, Moist heat, Vasopneumatic device, Manual therapy, and Re-evaluation  PLAN FOR NEXT SESSION: Pulleys, isometrics, active assisted range of motion, passive range of motion, and modalities as needed   Newman Pies, PTA 06/09/2022, 10:47 AM

## 2022-06-12 ENCOUNTER — Ambulatory Visit: Payer: No Typology Code available for payment source

## 2022-06-12 ENCOUNTER — Encounter: Payer: Self-pay | Admitting: Internal Medicine

## 2022-06-12 DIAGNOSIS — M25611 Stiffness of right shoulder, not elsewhere classified: Secondary | ICD-10-CM

## 2022-06-12 DIAGNOSIS — M25511 Pain in right shoulder: Secondary | ICD-10-CM

## 2022-06-12 NOTE — Therapy (Signed)
OUTPATIENT PHYSICAL THERAPY SHOULDER TREATMENT   Patient Name: Kristin Rasmussen MRN: [id number] DOB:Oct 29, 1960, 62 y.o., female Today's Date: 06/12/2022  END OF SESSION:  PT End of Session - 06/12/22 0952     Visit Number 4    Number of Visits 12    Date for PT Re-Evaluation 08/01/22    PT Start Time 0945    PT Stop Time 1045    PT Time Calculation (min) 60 min    Activity Tolerance Patient tolerated treatment well    Behavior During Therapy [facility] for tasks assessed/performed             Past Medical History:  Diagnosis Date   Anemia 2004   HCT 34.7   Anxiety    follows with psyc for same   Back pain    Basal cell cancer    x3; Dr Danella Deis   Bladder leak    Chest pain 06/2010   Costochondritis   Diverticulosis    Gallbladder problem    GERD (gastroesophageal reflux disease)    Hyperlipidemia    IBS (irritable bowel syndrome)    Melanoma (HCC) 2006   x1   PCOS (polycystic ovarian syndrome)    Plantar fasciitis 08/13/2011   Left is significant and RT is mild by [place] criteria    Spasm of esophagus    Swelling    Thrombocytopenia (HCC) 2002   133,000 platelets   Past Surgical History:  Procedure Laterality Date   CATARACT EXTRACTION W/PHACO Right 05/13/2021   Procedure: CATARACT EXTRACTION PHACO AND INTRAOCULAR LENS PLACEMENT (IOC);  Surgeon: Fabio Pierce, MD;  Location: [facility];  Service: Ophthalmology;  Laterality: Right;  CDE: 8.14    CATARACT EXTRACTION W/PHACO Left 05/27/2021   Procedure: CATARACT EXTRACTION PHACO AND INTRAOCULAR LENS PLACEMENT (IOC);  Surgeon: Fabio Pierce, MD;  Location: [facility];  Service: Ophthalmology;  Laterality: Left;  CDE: 6.55   CHOLECYSTECTOMY  2002   CCS   COLONOSCOPY     MASS EXCISION Right 10/22/2021   Procedure: EXCISION MASS, RIGHT FOOT;  Surgeon: Teryl Lucy, MD;  Location: [facility];  Service: Orthopedics;  Laterality: Right;   MELANOMA EXCISION     LUE 2006  CCS; Derm : Dr Campbell Stall   SHOULDER  ACROMIOPLASTY Right 05/13/2022   Procedure: SHOULDER ACROMIOPLASTY;  Surgeon: Teryl Lucy, MD;  Location: [facility];  Service: Orthopedics;  Laterality: Right;   SHOULDER ARTHROSCOPY WITH DISTAL CLAVICLE RESECTION Right 05/13/2022   Procedure: SHOULDER ARTHROSCOPY WITH DISTAL CLAVICLE EXCISION;  Surgeon: Teryl Lucy, MD;  Location: [facility];  Service: Orthopedics;  Laterality: Right;   SHOULDER SURGERY     Left   TONSILLECTOMY     TOTAL ABDOMINAL HYSTERECTOMY  1991   abnormal PAP hx   Patient Active Problem List   Diagnosis Date Noted   Hair loss 12/20/2020   Prediabetes 12/24/2019   Obesity (BMI 30-39.9) 12/20/2019   Overactive bladder 12/09/2018   Menopausal symptom 11/02/2018   Right wrist pain 12/07/2017   Insulin resistance 10/08/2016   Vitamin D deficiency 09/15/2016   Osteopenia 04/19/2015   Chronic lower back pain 09/19/2014   Anxiety    Metatarsalgia 08/13/2011   DIVERTICULOSIS, COLON 01/20/2008   SKIN CANCER, HX OF 01/20/2008   History of melanoma in situ 10/28/2006   HYPERLIPIDEMIA 10/28/2006   GERD 10/28/2006    PCP: Pincus Sanes, MD  REFERRING PROVIDER: Teryl Lucy, MD  REFERRING DIAG: S/P right shoulder scope, acromioplasty, distal clavicle excision, trephination  for calcific tendinitis  THERAPY DIAG:  Acute pain of right shoulder  Stiffness of right shoulder, not elsewhere classified  Rationale for Evaluation and Treatment: Rehabilitation  ONSET DATE: 05/13/22  SUBJECTIVE:                                                                                                                                                                                      SUBJECTIVE STATEMENT: Pt reports 3/10 right shoulder pain.  Pt reports performing exercises at home    Hand dominance: Right  PERTINENT HISTORY: Allergies, osteopenia, anxiety, chronic back pain, and history of melanoma  PAIN:  Are you having pain? Yes: NPRS  scale: 3/10 Pain location: right shoulder Pain description: sore and tender Aggravating factors: using her arm Relieving factors: medication and heat  PRECAUTIONS: None  WEIGHT BEARING RESTRICTIONS: No  FALLS:  Has patient fallen in last 6 months? No  LIVING ENVIRONMENT: Lives with: lives with their spouse Lives in: House/apartment  OCCUPATION: Retired  PLOF: Independent  PATIENT GOALS: improved use of her arm, be able to do yard work, be able to shower and dry her hair  NEXT MD VISIT: 06/23/22  OBJECTIVE:   PATIENT SURVEYS:  FOTO 40.62  COGNITION: Overall cognitive status: Within functional limits for tasks assessed     SENSATION: Patient reports rare tingling around her right shoulder  POSTURE: Rounded shoulders   UPPER EXTREMITY ROM:   Active ROM Right eval Left eval  Shoulder flexion 94/ 128 (PROM); tight and sore 143  Shoulder extension    Shoulder abduction 84/ 76 (PROM) limited by pain 134  Shoulder adduction    Shoulder internal rotation  To T4  Shoulder external rotation 25 (PROM at neutral)  To T2  Elbow flexion    Elbow extension    Wrist flexion    Wrist extension    Wrist ulnar deviation    Wrist radial deviation    Wrist pronation    Wrist supination    (Blank rows = not tested)  UPPER EXTREMITY MMT: not tested due to surgical condition  SHOULDER SPECIAL TESTS: Not tested due to surgical condition  JOINT MOBILITY TESTING:  Right AC joint: hypomobile and painful Right glenohumeral: hypomobile and painful  PALPATION:  TTP: right upper trapezius, infraspinatus, supraspinatus, pectoralis minor, and deltoid   TODAY'S TREATMENT:  DATE:                                    5/9 EXERCISE LOG  Exercise Repetitions and Resistance Comments  UBE x6 mins   Ranger Flexion/extension; CW; CCW circles x 3.5 mins  each   Wall Ladder x5 reps (max 23)   Doorway Stretch 30 secs x 3 reps   Wall Push Ups x15 reps     Blank cell = exercise not performed today   Manual Therapy Soft Tissue Mobilization: right shoulder, STW/M to right upper trap and cervical paraspinals to decrease pain and tone    Modalities  Date:  Unattended Estim: Shoulder, IFC 80-150 Hz, 15 mins, Pain Hot Pack: Shoulder, 15 mins, Pain and Tone  PATIENT EDUCATION: Education details: Plan of care, healing, prognosis, HEP, and goals for therapy Person educated: Patient and Spouse Education method: Explanation Education comprehension: verbalized understanding  HOME EXERCISE PROGRAM: Today's interventions were added to her HEP.  She was encouraged to perform these interventions at least twice per day and she reported understanding.  ASSESSMENT:  CLINICAL IMPRESSION: Pt arrives for today's treatment session reporting 3/10 right shoulder pain.  Pt able to increase max number on wall ladder to 23 today with ease.  Pt able to tolerate increased time or reps with all exercises today.  Pt with continued good results from STW.  Tone noted closer to spine. Normal responses to estim and MH noted upon removal.  Pt reported decreased pain at completion of today's treatment session.  OBJECTIVE IMPAIRMENTS: decreased activity tolerance, decreased knowledge of condition, decreased ROM, decreased strength, hypomobility, impaired perceived functional ability, impaired tone, impaired UE functional use, and pain.   ACTIVITY LIMITATIONS: carrying, lifting, sleeping, reach over head, and hygiene/grooming  PARTICIPATION LIMITATIONS: meal prep, cleaning, laundry, driving, shopping, community activity, and yard work  PERSONAL FACTORS: 3+ comorbidities: Allergies, osteopenia, anxiety, chronic back pain, and history of melanoma  are also affecting patient's functional outcome.   REHAB POTENTIAL: Good  CLINICAL DECISION MAKING: Evolving/moderate  complexity  EVALUATION COMPLEXITY: Moderate   GOALS: Goals reviewed with patient? Yes  SHORT TERM GOALS: Target date: 06/25/22  Patient will be independent with her initial HEP. Baseline: Goal status: INITIAL  2.  Patient will be able to complete her daily activities without her familiar pain exceeding 8/10. Baseline:  Goal status: INITIAL  3.  Patient will report being able to sleep in her bed without being awakened by her familiar right shoulder pain. Baseline:  Goal status: INITIAL  4.  Patient will be able to demonstrate at least 110 degrees of right shoulder flexion for improved function reaching overhead. Baseline:  Goal status: INITIAL  5.  Patient will be able to demonstrate at least 110 degrees of right shoulder abduction for improved function reaching. Baseline:  Goal status: INITIAL  LONG TERM GOALS: Target date: 07/16/22  Patient will be independent with her advanced HEP.  Baseline:  Goal status: INITIAL  2.  Patient will be able to complete her daily activities without her familiar pain exceeding 6/10. Baseline:  Goal status: INITIAL  3.  Patient will be able to demonstrate at least 130 degrees of right shoulder flexion for improved function reaching overhead. Baseline:  Goal status: INITIAL  4.  Patient will report being able to wash her back with her right upper extremity without needing assistance from her husband. Baseline:  Goal status: INITIAL  5.  Patient will be able to demonstrate at least 120 degrees of right shoulder abduction for improved function with overhead activity. Baseline:  Goal status: INITIAL  PLAN:  PT FREQUENCY: 2x/week  PT DURATION: 6 weeks  PLANNED INTERVENTIONS: Therapeutic exercises, Therapeutic activity, Neuromuscular re-education, Patient/Family education, Self Care, Joint mobilization, Electrical stimulation, Cryotherapy, Moist heat, Vasopneumatic device, Manual therapy, and Re-evaluation  PLAN FOR NEXT SESSION:  Pulleys, isometrics, active assisted range of motion, passive range of motion, and modalities as needed   Newman Pies, PTA 06/12/2022, 10:55 AM

## 2022-06-13 ENCOUNTER — Other Ambulatory Visit: Payer: Self-pay | Admitting: Internal Medicine

## 2022-06-16 ENCOUNTER — Ambulatory Visit: Payer: No Typology Code available for payment source

## 2022-06-16 DIAGNOSIS — M25511 Pain in right shoulder: Secondary | ICD-10-CM | POA: Diagnosis not present

## 2022-06-16 DIAGNOSIS — M25611 Stiffness of right shoulder, not elsewhere classified: Secondary | ICD-10-CM

## 2022-06-16 NOTE — Therapy (Signed)
OUTPATIENT PHYSICAL THERAPY SHOULDER TREATMENT   Patient Name: Kristin Rasmussen MRN: [id number] DOB:02/10/1960, 62 y.o., female Today's Date: 06/16/2022  END OF SESSION:  PT End of Session - 06/16/22 0950     Visit Number 5    Number of Visits 12    Date for PT Re-Evaluation 08/01/22    PT Start Time 0945    PT Stop Time 1045    PT Time Calculation (min) 60 min    Activity Tolerance Patient tolerated treatment well    Behavior During Therapy [facility] for tasks assessed/performed             Past Medical History:  Diagnosis Date   Anemia 2004   HCT 34.7   Anxiety    follows with psyc for same   Back pain    Basal cell cancer    x3; Dr Danella Deis   Bladder leak    Chest pain 06/2010   Costochondritis   Diverticulosis    Gallbladder problem    GERD (gastroesophageal reflux disease)    Hyperlipidemia    IBS (irritable bowel syndrome)    Melanoma (HCC) 2006   x1   PCOS (polycystic ovarian syndrome)    Plantar fasciitis 08/13/2011   Left is significant and RT is mild by [place] criteria    Spasm of esophagus    Swelling    Thrombocytopenia (HCC) 2002   133,000 platelets   Past Surgical History:  Procedure Laterality Date   CATARACT EXTRACTION W/PHACO Right 05/13/2021   Procedure: CATARACT EXTRACTION PHACO AND INTRAOCULAR LENS PLACEMENT (IOC);  Surgeon: Fabio Pierce, MD;  Location: [facility];  Service: Ophthalmology;  Laterality: Right;  CDE: 8.14    CATARACT EXTRACTION W/PHACO Left 05/27/2021   Procedure: CATARACT EXTRACTION PHACO AND INTRAOCULAR LENS PLACEMENT (IOC);  Surgeon: Fabio Pierce, MD;  Location: [facility];  Service: Ophthalmology;  Laterality: Left;  CDE: 6.55   CHOLECYSTECTOMY  2002   CCS   COLONOSCOPY     MASS EXCISION Right 10/22/2021   Procedure: EXCISION MASS, RIGHT FOOT;  Surgeon: Teryl Lucy, MD;  Location: [facility];  Service: Orthopedics;  Laterality: Right;   MELANOMA EXCISION     LUE 2006  CCS; Derm : Dr Campbell Stall   SHOULDER  ACROMIOPLASTY Right 05/13/2022   Procedure: SHOULDER ACROMIOPLASTY;  Surgeon: Teryl Lucy, MD;  Location: [facility];  Service: Orthopedics;  Laterality: Right;   SHOULDER ARTHROSCOPY WITH DISTAL CLAVICLE RESECTION Right 05/13/2022   Procedure: SHOULDER ARTHROSCOPY WITH DISTAL CLAVICLE EXCISION;  Surgeon: Teryl Lucy, MD;  Location: [facility];  Service: Orthopedics;  Laterality: Right;   SHOULDER SURGERY     Left   TONSILLECTOMY     TOTAL ABDOMINAL HYSTERECTOMY  1991   abnormal PAP hx   Patient Active Problem List   Diagnosis Date Noted   Hair loss 12/20/2020   Prediabetes 12/24/2019   Obesity (BMI 30-39.9) 12/20/2019   Overactive bladder 12/09/2018   Menopausal symptom 11/02/2018   Right wrist pain 12/07/2017   Insulin resistance 10/08/2016   Vitamin D deficiency 09/15/2016   Osteopenia 04/19/2015   Chronic lower back pain 09/19/2014   Anxiety    Metatarsalgia 08/13/2011   DIVERTICULOSIS, COLON 01/20/2008   SKIN CANCER, HX OF 01/20/2008   History of melanoma in situ 10/28/2006   HYPERLIPIDEMIA 10/28/2006   GERD 10/28/2006    PCP: Pincus Sanes, MD  REFERRING PROVIDER: Teryl Lucy, MD  REFERRING DIAG: S/P right shoulder scope, acromioplasty, distal clavicle excision, trephination  for calcific tendinitis  THERAPY DIAG:  Acute pain of right shoulder  Stiffness of right shoulder, not elsewhere classified  Rationale for Evaluation and Treatment: Rehabilitation  ONSET DATE: 05/13/22  SUBJECTIVE:                                                                                                                                                                                      SUBJECTIVE STATEMENT: Pt reports 2-3/10 right shoulder soreness.  Pt states that her shoulder feels stiff today, but feels good for the most part.    Hand dominance: Right  PERTINENT HISTORY: Allergies, osteopenia, anxiety, chronic back pain, and history of  melanoma  PAIN:  Are you having pain? Yes: NPRS scale: 2-3/10 Pain location: right shoulder Pain description: sore and tender Aggravating factors: using her arm Relieving factors: medication and heat  PRECAUTIONS: None  WEIGHT BEARING RESTRICTIONS: No  FALLS:  Has patient fallen in last 6 months? No  LIVING ENVIRONMENT: Lives with: lives with their spouse Lives in: House/apartment  OCCUPATION: Retired  PLOF: Independent  PATIENT GOALS: improved use of her arm, be able to do yard work, be able to shower and dry her hair  NEXT MD VISIT: 06/23/22  OBJECTIVE:   PATIENT SURVEYS:  FOTO 40.62  COGNITION: Overall cognitive status: Within functional limits for tasks assessed     SENSATION: Patient reports rare tingling around her right shoulder  POSTURE: Rounded shoulders   UPPER EXTREMITY ROM:   Active ROM Right eval Left eval  Shoulder flexion 94/ 128 (PROM); tight and sore 143  Shoulder extension    Shoulder abduction 84/ 76 (PROM) limited by pain 134  Shoulder adduction    Shoulder internal rotation  To T4  Shoulder external rotation 25 (PROM at neutral)  To T2  Elbow flexion    Elbow extension    Wrist flexion    Wrist extension    Wrist ulnar deviation    Wrist radial deviation    Wrist pronation    Wrist supination    (Blank rows = not tested)  UPPER EXTREMITY MMT: not tested due to surgical condition  SHOULDER SPECIAL TESTS: Not tested due to surgical condition  JOINT MOBILITY TESTING:  Right AC joint: hypomobile and painful Right glenohumeral: hypomobile and painful  PALPATION:  TTP: right upper trapezius, infraspinatus, supraspinatus, pectoralis minor, and deltoid   TODAY'S TREATMENT:  DATE:                                    5/13 EXERCISE LOG  Exercise Repetitions and Resistance Comments  UBE x6 mins    Ranger (standing) Flexion/extension; CW; CCW circles x 3.5 mins each   Wall Ladder x5 reps (max 23)   Doorway Stretch    Wall Push Ups    Row Yellow tband x20 reps   Extension Yellow tband x20 reps   IR/ER Yellow tband x20 reps each    Blank cell = exercise not performed today   Modalities  Date:  Unattended Estim: Shoulder, IFC 80-150 Hz, 15 mins, Pain Hot Pack: Shoulder, 15 mins, Pain and Tone  PATIENT EDUCATION: Education details: Plan of care, healing, prognosis, HEP, and goals for therapy Person educated: Patient and Spouse Education method: Explanation Education comprehension: verbalized understanding  HOME EXERCISE PROGRAM: Today's interventions were added to her HEP.  She was encouraged to perform these interventions at least twice per day and she reported understanding.  ASSESSMENT:  CLINICAL IMPRESSION: Pt arrives for today's treatment session reporting 2-3/10 right shoulder pain.  Pt able to increase FOTO score to 64 today.   Pt able to tolerate addition of standing tband exercises.   Pt requiring verbal and tactile cues and demonstration for proper technique and posture. Normal responses to estim and MH noted upon removal.  Pt denied any pain at completion of today's treatment session.    OBJECTIVE IMPAIRMENTS: decreased activity tolerance, decreased knowledge of condition, decreased ROM, decreased strength, hypomobility, impaired perceived functional ability, impaired tone, impaired UE functional use, and pain.   ACTIVITY LIMITATIONS: carrying, lifting, sleeping, reach over head, and hygiene/grooming  PARTICIPATION LIMITATIONS: meal prep, cleaning, laundry, driving, shopping, community activity, and yard work  PERSONAL FACTORS: 3+ comorbidities: Allergies, osteopenia, anxiety, chronic back pain, and history of melanoma  are also affecting patient's functional outcome.   REHAB POTENTIAL: Good  CLINICAL DECISION MAKING: Evolving/moderate complexity  EVALUATION  COMPLEXITY: Moderate   GOALS: Goals reviewed with patient? Yes  SHORT TERM GOALS: Target date: 06/25/22  Patient will be independent with her initial HEP. Baseline: Goal status: MET  2.  Patient will be able to complete her daily activities without her familiar pain exceeding 8/10. Baseline:  Goal status: MET  3.  Patient will report being able to sleep in her bed without being awakened by her familiar right shoulder pain. Baseline:  Goal status: IN PROGRESS  4.  Patient will be able to demonstrate at least 110 degrees of right shoulder flexion for improved function reaching overhead. Baseline:  Goal status: INITIAL  5.  Patient will be able to demonstrate at least 110 degrees of right shoulder abduction for improved function reaching. Baseline:  Goal status: INITIAL  LONG TERM GOALS: Target date: 07/16/22  Patient will be independent with her advanced HEP.  Baseline:  Goal status: IN PROGRESS  2.  Patient will be able to complete her daily activities without her familiar pain exceeding 6/10. Baseline:  Goal status: IN PROGRESS  3.  Patient will be able to demonstrate at least 130 degrees of right shoulder flexion for improved function reaching overhead. Baseline:  Goal status: IN PROGRESS  4.  Patient will report being able to wash her back with her right upper extremity without needing assistance from her husband. Baseline:  Goal status: IN PROGRESS  5.  Patient will be able to  demonstrate at least 120 degrees of right shoulder abduction for improved function with overhead activity. Baseline:  Goal status: IN PROGRESS  PLAN:  PT FREQUENCY: 2x/week  PT DURATION: 6 weeks  PLANNED INTERVENTIONS: Therapeutic exercises, Therapeutic activity, Neuromuscular re-education, Patient/Family education, Self Care, Joint mobilization, Electrical stimulation, Cryotherapy, Moist heat, Vasopneumatic device, Manual therapy, and Re-evaluation  PLAN FOR NEXT SESSION: Pulleys,  isometrics, active assisted range of motion, passive range of motion, and modalities as needed   Newman Pies, PTA 06/16/2022, 11:09 AM

## 2022-06-20 ENCOUNTER — Ambulatory Visit: Payer: PRIVATE HEALTH INSURANCE

## 2022-06-20 DIAGNOSIS — M25511 Pain in right shoulder: Secondary | ICD-10-CM | POA: Diagnosis not present

## 2022-06-20 DIAGNOSIS — M25611 Stiffness of right shoulder, not elsewhere classified: Secondary | ICD-10-CM

## 2022-06-20 NOTE — Therapy (Signed)
OUTPATIENT PHYSICAL THERAPY SHOULDER TREATMENT   Patient Name: Kristin Rasmussen MRN: [id number] DOB:26-May-1960, 62 y.o., female Today's Date: 06/20/2022  END OF SESSION:  PT End of Session - 06/20/22 0949     Visit Number 6    Number of Visits 12    Date for PT Re-Evaluation 08/01/22    PT Start Time 0945    PT Stop Time 1046    PT Time Calculation (min) 61 min    Activity Tolerance Patient tolerated treatment well    Behavior During Therapy [facility] for tasks assessed/performed             Past Medical History:  Diagnosis Date   Anemia 2004   HCT 34.7   Anxiety    follows with psyc for same   Back pain    Basal cell cancer    x3; Dr Danella Deis   Bladder leak    Chest pain 06/2010   Costochondritis   Diverticulosis    Gallbladder problem    GERD (gastroesophageal reflux disease)    Hyperlipidemia    IBS (irritable bowel syndrome)    Melanoma (HCC) 2006   x1   PCOS (polycystic ovarian syndrome)    Plantar fasciitis 08/13/2011   Left is significant and RT is mild by [place] criteria    Spasm of esophagus    Swelling    Thrombocytopenia (HCC) 2002   133,000 platelets   Past Surgical History:  Procedure Laterality Date   CATARACT EXTRACTION W/PHACO Right 05/13/2021   Procedure: CATARACT EXTRACTION PHACO AND INTRAOCULAR LENS PLACEMENT (IOC);  Surgeon: Fabio Pierce, MD;  Location: [facility];  Service: Ophthalmology;  Laterality: Right;  CDE: 8.14    CATARACT EXTRACTION W/PHACO Left 05/27/2021   Procedure: CATARACT EXTRACTION PHACO AND INTRAOCULAR LENS PLACEMENT (IOC);  Surgeon: Fabio Pierce, MD;  Location: [facility];  Service: Ophthalmology;  Laterality: Left;  CDE: 6.55   CHOLECYSTECTOMY  2002   CCS   COLONOSCOPY     MASS EXCISION Right 10/22/2021   Procedure: EXCISION MASS, RIGHT FOOT;  Surgeon: Teryl Lucy, MD;  Location: [facility];  Service: Orthopedics;  Laterality: Right;   MELANOMA EXCISION     LUE 2006  CCS; Derm : Dr Campbell Stall   SHOULDER  ACROMIOPLASTY Right 05/13/2022   Procedure: SHOULDER ACROMIOPLASTY;  Surgeon: Teryl Lucy, MD;  Location: [facility];  Service: Orthopedics;  Laterality: Right;   SHOULDER ARTHROSCOPY WITH DISTAL CLAVICLE RESECTION Right 05/13/2022   Procedure: SHOULDER ARTHROSCOPY WITH DISTAL CLAVICLE EXCISION;  Surgeon: Teryl Lucy, MD;  Location: [facility];  Service: Orthopedics;  Laterality: Right;   SHOULDER SURGERY     Left   TONSILLECTOMY     TOTAL ABDOMINAL HYSTERECTOMY  1991   abnormal PAP hx   Patient Active Problem List   Diagnosis Date Noted   Hair loss 12/20/2020   Prediabetes 12/24/2019   Obesity (BMI 30-39.9) 12/20/2019   Overactive bladder 12/09/2018   Menopausal symptom 11/02/2018   Right wrist pain 12/07/2017   Insulin resistance 10/08/2016   Vitamin D deficiency 09/15/2016   Osteopenia 04/19/2015   Chronic lower back pain 09/19/2014   Anxiety    Metatarsalgia 08/13/2011   DIVERTICULOSIS, COLON 01/20/2008   SKIN CANCER, HX OF 01/20/2008   History of melanoma in situ 10/28/2006   HYPERLIPIDEMIA 10/28/2006   GERD 10/28/2006    PCP: Pincus Sanes, MD  REFERRING PROVIDER: Teryl Lucy, MD  REFERRING DIAG: S/P right shoulder scope, acromioplasty, distal clavicle excision, trephination  for calcific tendinitis  THERAPY DIAG:  Acute pain of right shoulder  Stiffness of right shoulder, not elsewhere classified  Rationale for Evaluation and Treatment: Rehabilitation  ONSET DATE: 05/13/22  SUBJECTIVE:                                                                                                                                                                                      SUBJECTIVE STATEMENT: Pt reports minimal right shoulder pain and stiffness today.   Hand dominance: Right  PERTINENT HISTORY: Allergies, osteopenia, anxiety, chronic back pain, and history of melanoma  PAIN:  Are you having pain? Yes: NPRS scale: 1/10 Pain  location: right shoulder Pain description: sore and tender Aggravating factors: using her arm Relieving factors: medication and heat  PRECAUTIONS: None  WEIGHT BEARING RESTRICTIONS: No  FALLS:  Has patient fallen in last 6 months? No  LIVING ENVIRONMENT: Lives with: lives with their spouse Lives in: House/apartment  OCCUPATION: Retired  PLOF: Independent  PATIENT GOALS: improved use of her arm, be able to do yard work, be able to shower and dry her hair  NEXT MD VISIT: 06/23/22  OBJECTIVE:   PATIENT SURVEYS:  FOTO 40.62  COGNITION: Overall cognitive status: Within functional limits for tasks assessed     SENSATION: Patient reports rare tingling around her right shoulder  POSTURE: Rounded shoulders   UPPER EXTREMITY ROM:   Active ROM Right eval Left eval  Shoulder flexion 94/ 128 (PROM); tight and sore 143  Shoulder extension    Shoulder abduction 84/ 76 (PROM) limited by pain 134  Shoulder adduction    Shoulder internal rotation  To T4  Shoulder external rotation 25 (PROM at neutral)  To T2  Elbow flexion    Elbow extension    Wrist flexion    Wrist extension    Wrist ulnar deviation    Wrist radial deviation    Wrist pronation    Wrist supination    (Blank rows = not tested)  UPPER EXTREMITY MMT: not tested due to surgical condition  SHOULDER SPECIAL TESTS: Not tested due to surgical condition  JOINT MOBILITY TESTING:  Right AC joint: hypomobile and painful Right glenohumeral: hypomobile and painful  PALPATION:  TTP: right upper trapezius, infraspinatus, supraspinatus, pectoralis minor, and deltoid   TODAY'S TREATMENT:  DATE:                                    5/17 EXERCISE LOG  Exercise Repetitions and Resistance Comments  UBE x10 mins   Ranger (standing) Flexion/extension; CW; CCW circles x 3.5 mins each    Wall Ladder    Doorway Stretch 5 reps x 30 sec hold   Wall Push Ups    Row Yellow tband x20 reps   Extension Yellow tband x20 reps   IR/ER Yellow tband x20 reps each    Blank cell = exercise not performed today   Modalities  Date:  Unattended Estim: Shoulder, IFC 80-150 Hz, 15 mins, Pain Hot Pack: Shoulder, 15 mins, Pain and Tone  PATIENT EDUCATION: Education details: Plan of care, healing, prognosis, HEP, and goals for therapy Person educated: Patient and Spouse Education method: Explanation Education comprehension: verbalized understanding  HOME EXERCISE PROGRAM: Today's interventions were added to her HEP.  She was encouraged to perform these interventions at least twice per day and she reported understanding.  ASSESSMENT:  CLINICAL IMPRESSION: Pt arrives for today's treatment session reporting 1/10 right shoulder pain.  Pt able to tolerate increased time with UBE today without issue.  Pt encouraged to perform doorway stretch at home to decrease right pec tone and tightness.  STW/M to right upper trap and pec major to decrease pain and tone.  Normal responses to estim and MH noted upon removal.  Pt denied any pain upon completion of today's treatment session.  OBJECTIVE IMPAIRMENTS: decreased activity tolerance, decreased knowledge of condition, decreased ROM, decreased strength, hypomobility, impaired perceived functional ability, impaired tone, impaired UE functional use, and pain.   ACTIVITY LIMITATIONS: carrying, lifting, sleeping, reach over head, and hygiene/grooming  PARTICIPATION LIMITATIONS: meal prep, cleaning, laundry, driving, shopping, community activity, and yard work  PERSONAL FACTORS: 3+ comorbidities: Allergies, osteopenia, anxiety, chronic back pain, and history of melanoma  are also affecting patient's functional outcome.   REHAB POTENTIAL: Good  CLINICAL DECISION MAKING: Evolving/moderate complexity  EVALUATION COMPLEXITY: Moderate   GOALS: Goals  reviewed with patient? Yes  SHORT TERM GOALS: Target date: 06/25/22  Patient will be independent with her initial HEP. Baseline: Goal status: MET  2.  Patient will be able to complete her daily activities without her familiar pain exceeding 8/10. Baseline:  Goal status: MET  3.  Patient will report being able to sleep in her bed without being awakened by her familiar right shoulder pain. Baseline:  Goal status: IN PROGRESS  4.  Patient will be able to demonstrate at least 110 degrees of right shoulder flexion for improved function reaching overhead. Baseline:  Goal status: INITIAL  5.  Patient will be able to demonstrate at least 110 degrees of right shoulder abduction for improved function reaching. Baseline:  Goal status: INITIAL  LONG TERM GOALS: Target date: 07/16/22  Patient will be independent with her advanced HEP.  Baseline:  Goal status: IN PROGRESS  2.  Patient will be able to complete her daily activities without her familiar pain exceeding 6/10. Baseline:  Goal status: IN PROGRESS  3.  Patient will be able to demonstrate at least 130 degrees of right shoulder flexion for improved function reaching overhead. Baseline:  Goal status: IN PROGRESS  4.  Patient will report being able to wash her back with her right upper extremity without needing assistance from her husband. Baseline:  Goal status: IN PROGRESS  5.  Patient will be able to demonstrate at least 120 degrees of right shoulder abduction for improved function with overhead activity. Baseline:  Goal status: IN PROGRESS  PLAN:  PT FREQUENCY: 2x/week  PT DURATION: 6 weeks  PLANNED INTERVENTIONS: Therapeutic exercises, Therapeutic activity, Neuromuscular re-education, Patient/Family education, Self Care, Joint mobilization, Electrical stimulation, Cryotherapy, Moist heat, Vasopneumatic device, Manual therapy, and Re-evaluation  PLAN FOR NEXT SESSION: Pulleys, isometrics, active assisted range of  motion, passive range of motion, and modalities as needed   Newman Pies, PTA 06/20/2022, 11:31 AM

## 2022-06-24 ENCOUNTER — Ambulatory Visit: Payer: PRIVATE HEALTH INSURANCE

## 2022-06-24 ENCOUNTER — Encounter: Payer: No Typology Code available for payment source | Admitting: Internal Medicine

## 2022-06-24 DIAGNOSIS — M25611 Stiffness of right shoulder, not elsewhere classified: Secondary | ICD-10-CM

## 2022-06-24 DIAGNOSIS — M25511 Pain in right shoulder: Secondary | ICD-10-CM

## 2022-06-24 NOTE — Therapy (Signed)
OUTPATIENT PHYSICAL THERAPY SHOULDER TREATMENT   Patient Name: Collett Jay MRN: [id number] DOB:03/13/1960, 62 y.o., female Today's Date: 06/24/2022  END OF SESSION:  PT End of Session - 06/24/22 1308     Visit Number 7    Number of Visits 12    Date for PT Re-Evaluation 08/01/22    PT Start Time 1300    PT Stop Time 1400    PT Time Calculation (min) 60 min    Activity Tolerance Patient tolerated treatment well    Behavior During Therapy [facility] for tasks assessed/performed             Past Medical History:  Diagnosis Date   Anemia 2004   HCT 34.7   Anxiety    follows with psyc for same   Back pain    Basal cell cancer    x3; Dr Danella Deis   Bladder leak    Chest pain 06/2010   Costochondritis   Diverticulosis    Gallbladder problem    GERD (gastroesophageal reflux disease)    Hyperlipidemia    IBS (irritable bowel syndrome)    Melanoma (HCC) 2006   x1   PCOS (polycystic ovarian syndrome)    Plantar fasciitis 08/13/2011   Left is significant and RT is mild by [place] criteria    Spasm of esophagus    Swelling    Thrombocytopenia (HCC) 2002   133,000 platelets   Past Surgical History:  Procedure Laterality Date   CATARACT EXTRACTION W/PHACO Right 05/13/2021   Procedure: CATARACT EXTRACTION PHACO AND INTRAOCULAR LENS PLACEMENT (IOC);  Surgeon: Fabio Pierce, MD;  Location: [facility];  Service: Ophthalmology;  Laterality: Right;  CDE: 8.14    CATARACT EXTRACTION W/PHACO Left 05/27/2021   Procedure: CATARACT EXTRACTION PHACO AND INTRAOCULAR LENS PLACEMENT (IOC);  Surgeon: Fabio Pierce, MD;  Location: [facility];  Service: Ophthalmology;  Laterality: Left;  CDE: 6.55   CHOLECYSTECTOMY  2002   CCS   COLONOSCOPY     MASS EXCISION Right 10/22/2021   Procedure: EXCISION MASS, RIGHT FOOT;  Surgeon: Teryl Lucy, MD;  Location: [facility];  Service: Orthopedics;  Laterality: Right;   MELANOMA EXCISION     LUE 2006  CCS; Derm : Dr Campbell Stall   SHOULDER  ACROMIOPLASTY Right 05/13/2022   Procedure: SHOULDER ACROMIOPLASTY;  Surgeon: Teryl Lucy, MD;  Location: [facility];  Service: Orthopedics;  Laterality: Right;   SHOULDER ARTHROSCOPY WITH DISTAL CLAVICLE RESECTION Right 05/13/2022   Procedure: SHOULDER ARTHROSCOPY WITH DISTAL CLAVICLE EXCISION;  Surgeon: Teryl Lucy, MD;  Location: [facility];  Service: Orthopedics;  Laterality: Right;   SHOULDER SURGERY     Left   TONSILLECTOMY     TOTAL ABDOMINAL HYSTERECTOMY  1991   abnormal PAP hx   Patient Active Problem List   Diagnosis Date Noted   Hair loss 12/20/2020   Prediabetes 12/24/2019   Obesity (BMI 30-39.9) 12/20/2019   Overactive bladder 12/09/2018   Menopausal symptom 11/02/2018   Right wrist pain 12/07/2017   Insulin resistance 10/08/2016   Vitamin D deficiency 09/15/2016   Osteopenia 04/19/2015   Chronic lower back pain 09/19/2014   Anxiety    Metatarsalgia 08/13/2011   DIVERTICULOSIS, COLON 01/20/2008   SKIN CANCER, HX OF 01/20/2008   History of melanoma in situ 10/28/2006   HYPERLIPIDEMIA 10/28/2006   GERD 10/28/2006    PCP: Pincus Sanes, MD  REFERRING PROVIDER: Teryl Lucy, MD  REFERRING DIAG: S/P right shoulder scope, acromioplasty, distal clavicle excision, trephination  for calcific tendinitis  THERAPY DIAG:  Acute pain of right shoulder  Stiffness of right shoulder, not elsewhere classified  Rationale for Evaluation and Treatment: Rehabilitation  ONSET DATE: 05/13/22  SUBJECTIVE:                                                                                                                                                                                      SUBJECTIVE STATEMENT: Pt reports minimal right shoulder pain and stiffness today.   Hand dominance: Right  PERTINENT HISTORY: Allergies, osteopenia, anxiety, chronic back pain, and history of melanoma  PAIN:  Are you having pain? Yes: NPRS scale: 3/10 Pain  location: right shoulder Pain description: sore and tender Aggravating factors: using her arm Relieving factors: medication and heat  PRECAUTIONS: None  WEIGHT BEARING RESTRICTIONS: No  FALLS:  Has patient fallen in last 6 months? No  LIVING ENVIRONMENT: Lives with: lives with their spouse Lives in: House/apartment  OCCUPATION: Retired  PLOF: Independent  PATIENT GOALS: improved use of her arm, be able to do yard work, be able to shower and dry her hair  NEXT MD VISIT: 06/23/22  OBJECTIVE:   PATIENT SURVEYS:  FOTO 40.62  COGNITION: Overall cognitive status: Within functional limits for tasks assessed     SENSATION: Patient reports rare tingling around her right shoulder  POSTURE: Rounded shoulders   UPPER EXTREMITY ROM:   Active ROM Right eval Left eval  Shoulder flexion 94/ 128 (PROM); tight and sore 143  Shoulder extension    Shoulder abduction 84/ 76 (PROM) limited by pain 134  Shoulder adduction    Shoulder internal rotation  To T4  Shoulder external rotation 25 (PROM at neutral)  To T2  Elbow flexion    Elbow extension    Wrist flexion    Wrist extension    Wrist ulnar deviation    Wrist radial deviation    Wrist pronation    Wrist supination    (Blank rows = not tested)  UPPER EXTREMITY MMT: not tested due to surgical condition  SHOULDER SPECIAL TESTS: Not tested due to surgical condition  JOINT MOBILITY TESTING:  Right AC joint: hypomobile and painful Right glenohumeral: hypomobile and painful  PALPATION:  TTP: right upper trapezius, infraspinatus, supraspinatus, pectoralis minor, and deltoid   TODAY'S TREATMENT:  DATE:                                    5/21 EXERCISE LOG  Exercise Repetitions and Resistance Comments  UBE x10 mins   Ranger (standing) Flexion/extension; CW; CCW circles x 3.5 mins each    Wall Ladder 5 reps (max 23)   Doorway Stretch 5 reps x 30 sec hold   Wall Push Ups    Row Yellow tband x25 reps   Extension Yellow tband x25 reps   IR/ER Yellow tband x25 reps each    Blank cell = exercise not performed today   Manual Therapy Soft Tissue Mobilization: right deltoid, STW/M to right deltoid to decrease pain and tone    Modalities  Date:  Unattended Estim: Shoulder, IFC 80-150 Hz, 15 mins, Pain Hot Pack: Shoulder, 15 mins, Pain and Tone  PATIENT EDUCATION: Education details: Plan of care, healing, prognosis, HEP, and goals for therapy Person educated: Patient and Spouse Education method: Explanation Education comprehension: verbalized understanding  HOME EXERCISE PROGRAM: Today's interventions were added to her HEP.  She was encouraged to perform these interventions at least twice per day and she reported understanding.  ASSESSMENT:  CLINICAL IMPRESSION: Pt arrives for today's treatment session reporting 3/10 right shoulder pain.  Pt reports working in her garden over the weekend which may have contributed to her increased pain.  Pt able to tolerate increased reps with tband exercises today with minimal fatigue reported.  STW/M performed to right deltoid to decrease pain and tone.  Normal responses to estim and MH noted upon removal.  Pt reported decreased pain at completion of today's treatment session.  OBJECTIVE IMPAIRMENTS: decreased activity tolerance, decreased knowledge of condition, decreased ROM, decreased strength, hypomobility, impaired perceived functional ability, impaired tone, impaired UE functional use, and pain.   ACTIVITY LIMITATIONS: carrying, lifting, sleeping, reach over head, and hygiene/grooming  PARTICIPATION LIMITATIONS: meal prep, cleaning, laundry, driving, shopping, community activity, and yard work  PERSONAL FACTORS: 3+ comorbidities: Allergies, osteopenia, anxiety, chronic back pain, and history of melanoma  are also affecting  patient's functional outcome.   REHAB POTENTIAL: Good  CLINICAL DECISION MAKING: Evolving/moderate complexity  EVALUATION COMPLEXITY: Moderate   GOALS: Goals reviewed with patient? Yes  SHORT TERM GOALS: Target date: 06/25/22  Patient will be independent with her initial HEP. Baseline: Goal status: MET  2.  Patient will be able to complete her daily activities without her familiar pain exceeding 8/10. Baseline:  Goal status: MET  3.  Patient will report being able to sleep in her bed without being awakened by her familiar right shoulder pain. Baseline:  Goal status: IN PROGRESS  4.  Patient will be able to demonstrate at least 110 degrees of right shoulder flexion for improved function reaching overhead. Baseline:  Goal status: INITIAL  5.  Patient will be able to demonstrate at least 110 degrees of right shoulder abduction for improved function reaching. Baseline:  Goal status: INITIAL  LONG TERM GOALS: Target date: 07/16/22  Patient will be independent with her advanced HEP.  Baseline:  Goal status: IN PROGRESS  2.  Patient will be able to complete her daily activities without her familiar pain exceeding 6/10. Baseline:  Goal status: IN PROGRESS  3.  Patient will be able to demonstrate at least 130 degrees of right shoulder flexion for improved function reaching overhead. Baseline:  Goal status: IN PROGRESS  4.  Patient will report  being able to wash her back with her right upper extremity without needing assistance from her husband. Baseline:  Goal status: IN PROGRESS  5.  Patient will be able to demonstrate at least 120 degrees of right shoulder abduction for improved function with overhead activity. Baseline:  Goal status: IN PROGRESS  PLAN:  PT FREQUENCY: 2x/week  PT DURATION: 6 weeks  PLANNED INTERVENTIONS: Therapeutic exercises, Therapeutic activity, Neuromuscular re-education, Patient/Family education, Self Care, Joint mobilization, Electrical  stimulation, Cryotherapy, Moist heat, Vasopneumatic device, Manual therapy, and Re-evaluation  PLAN FOR NEXT SESSION: Pulleys, isometrics, active assisted range of motion, passive range of motion, and modalities as needed   Newman Pies, PTA 06/24/2022, 2:45 PM

## 2022-06-26 ENCOUNTER — Telehealth: Payer: Self-pay | Admitting: Internal Medicine

## 2022-06-26 ENCOUNTER — Encounter: Payer: No Typology Code available for payment source | Admitting: Internal Medicine

## 2022-06-26 NOTE — Telephone Encounter (Signed)
Thank you Alan Ripper.  Courtney, Please reschedule her colonoscopy with movie prep. Have the nurse prescribe Zofran 4 mg; #2; take 120 minutes before each prep session Thanks, Dr. Marina Goodell

## 2022-06-26 NOTE — Telephone Encounter (Incomplete)
Received a phone call from

## 2022-06-26 NOTE — Telephone Encounter (Signed)
Received a page to the [employer] GI On Call pager. Patient is scheduled for a colonoscopy with Dr. Marina Goodell on 5/23. Patient and her husband state that she drank the first half of her Suprep bowel prep and then immediately vomited up the contents. She states that she did try to drink the prep slowly. She has not had any bowel movements thus far. I offered to replace the first half of her bowel prep with Miralax/Gatorade that the patient could get at a local pharmacy, but the patient prefers to reschedule her colonoscopy at this time. She would like to use a different prep for her colonoscopy in the future (in the past she has tolerated Moviprep well). She does have some Zofran at home to help with nausea. Will CC Dr. Marina Goodell and [facility] [place] to this telephone note.

## 2022-06-27 ENCOUNTER — Ambulatory Visit: Payer: No Typology Code available for payment source | Admitting: Physical Therapy

## 2022-06-27 ENCOUNTER — Encounter: Payer: Self-pay | Admitting: Physical Therapy

## 2022-06-27 DIAGNOSIS — M25511 Pain in right shoulder: Secondary | ICD-10-CM | POA: Diagnosis not present

## 2022-06-27 DIAGNOSIS — M25611 Stiffness of right shoulder, not elsewhere classified: Secondary | ICD-10-CM

## 2022-06-27 NOTE — Therapy (Addendum)
OUTPATIENT PHYSICAL THERAPY SHOULDER TREATMENT   Patient Name: Kristin Rasmussen MRN: [id number] DOB:1960-07-08, 62 y.o., female Today's Date: 06/27/2022  END OF SESSION:  PT End of Session - 06/27/22 1116     Visit Number 8    Number of Visits 12    Date for PT Re-Evaluation 08/01/22    PT Start Time 1115    PT Stop Time 1202    PT Time Calculation (min) 47 min    Activity Tolerance Patient tolerated treatment well    Behavior During Therapy [facility] for tasks assessed/performed             Past Medical History:  Diagnosis Date   Anemia 2004   HCT 34.7   Anxiety    follows with psyc for same   Back pain    Basal cell cancer    x3; Dr Danella Deis   Bladder leak    Chest pain 06/2010   Costochondritis   Diverticulosis    Gallbladder problem    GERD (gastroesophageal reflux disease)    Hyperlipidemia    IBS (irritable bowel syndrome)    Melanoma (HCC) 2006   x1   PCOS (polycystic ovarian syndrome)    Plantar fasciitis 08/13/2011   Left is significant and RT is mild by [place] criteria    Spasm of esophagus    Swelling    Thrombocytopenia (HCC) 2002   133,000 platelets   Past Surgical History:  Procedure Laterality Date   CATARACT EXTRACTION W/PHACO Right 05/13/2021   Procedure: CATARACT EXTRACTION PHACO AND INTRAOCULAR LENS PLACEMENT (IOC);  Surgeon: Fabio Pierce, MD;  Location: [facility];  Service: Ophthalmology;  Laterality: Right;  CDE: 8.14    CATARACT EXTRACTION W/PHACO Left 05/27/2021   Procedure: CATARACT EXTRACTION PHACO AND INTRAOCULAR LENS PLACEMENT (IOC);  Surgeon: Fabio Pierce, MD;  Location: [facility];  Service: Ophthalmology;  Laterality: Left;  CDE: 6.55   CHOLECYSTECTOMY  2002   CCS   COLONOSCOPY     MASS EXCISION Right 10/22/2021   Procedure: EXCISION MASS, RIGHT FOOT;  Surgeon: Teryl Lucy, MD;  Location: [facility];  Service: Orthopedics;  Laterality: Right;   MELANOMA EXCISION     LUE 2006  CCS; Derm : Dr Campbell Stall   SHOULDER  ACROMIOPLASTY Right 05/13/2022   Procedure: SHOULDER ACROMIOPLASTY;  Surgeon: Teryl Lucy, MD;  Location: [facility];  Service: Orthopedics;  Laterality: Right;   SHOULDER ARTHROSCOPY WITH DISTAL CLAVICLE RESECTION Right 05/13/2022   Procedure: SHOULDER ARTHROSCOPY WITH DISTAL CLAVICLE EXCISION;  Surgeon: Teryl Lucy, MD;  Location: [facility];  Service: Orthopedics;  Laterality: Right;   SHOULDER SURGERY     Left   TONSILLECTOMY     TOTAL ABDOMINAL HYSTERECTOMY  1991   abnormal PAP hx   Patient Active Problem List   Diagnosis Date Noted   Hair loss 12/20/2020   Prediabetes 12/24/2019   Obesity (BMI 30-39.9) 12/20/2019   Overactive bladder 12/09/2018   Menopausal symptom 11/02/2018   Right wrist pain 12/07/2017   Insulin resistance 10/08/2016   Vitamin D deficiency 09/15/2016   Osteopenia 04/19/2015   Chronic lower back pain 09/19/2014   Anxiety    Metatarsalgia 08/13/2011   DIVERTICULOSIS, COLON 01/20/2008   SKIN CANCER, HX OF 01/20/2008   History of melanoma in situ 10/28/2006   HYPERLIPIDEMIA 10/28/2006   GERD 10/28/2006    PCP: Pincus Sanes, MD  REFERRING PROVIDER: Teryl Lucy, MD  REFERRING DIAG: S/P right shoulder scope, acromioplasty, distal clavicle excision, trephination  for calcific tendinitis  THERAPY DIAG:  Acute pain of right shoulder  Stiffness of right shoulder, not elsewhere classified  Rationale for Evaluation and Treatment: Rehabilitation  ONSET DATE: 05/13/22  SUBJECTIVE:                                                                                                                                                                                      SUBJECTIVE STATEMENT: Hasn't been able to do HEP as she was sick Wednesday and Thursday of this week and not felt well.   Hand dominance: Right  PERTINENT HISTORY: Allergies, osteopenia, anxiety, chronic back pain, and history of melanoma  PAIN:  Are you having  pain? Yes: NPRS scale: 1/10 Pain location: right shoulder Pain description: sore, tender Aggravating factors: using her arm Relieving factors: medication and heat  PRECAUTIONS: None  PATIENT GOALS: improved use of her arm, be able to do yard work, be able to shower and dry her hair  NEXT MD VISIT: 06/23/22  OBJECTIVE:   PATIENT SURVEYS:  FOTO 40.62  COGNITION: Overall cognitive status: Within functional limits for tasks assessed     SENSATION: Patient reports rare tingling around her right shoulder  POSTURE: Rounded shoulders   UPPER EXTREMITY ROM:   Active ROM Right eval Left eval  Shoulder flexion 94/ 128 (PROM); tight and sore 143  Shoulder extension    Shoulder abduction 84/ 76 (PROM) limited by pain 134  Shoulder adduction    Shoulder internal rotation  To T4  Shoulder external rotation 25 (PROM at neutral)  To T2  Elbow flexion    Elbow extension    Wrist flexion    Wrist extension    Wrist ulnar deviation    Wrist radial deviation    Wrist pronation    Wrist supination    (Blank rows = not tested)  UPPER EXTREMITY MMT: not tested due to surgical condition  SHOULDER SPECIAL TESTS: Not tested due to surgical condition  JOINT MOBILITY TESTING:  Right AC joint: hypomobile and painful Right glenohumeral: hypomobile and painful  PALPATION:  TTP: right upper trapezius, infraspinatus, supraspinatus, pectoralis minor, and deltoid   TODAY'S TREATMENT:  DATE:    5/24 EXERCISE LOG  Exercise Repetitions and Resistance Comments  UBE x10 min   Pulley X5 min   Ranger (standing) Flexion/extension; CW; CCW circles    Wall Ladder 6 reps (max 23)   Row Yellow tband x25 reps   Extension Yellow tband x25 reps   IR/ER Yellow tband x25 reps each    Blank cell = exercise not performed today   Modalities  Date: 06/27/22 Unattended  Estim: Shoulder, IFC 80-150 Hz, 10 mins, Pain Hot Pack: Shoulder, 10 mins, Pain and Tone  PATIENT EDUCATION: Education details: Plan of care, healing, prognosis, HEP, and goals for therapy Person educated: Patient and Spouse Education method: Explanation Education comprehension: verbalized understanding  HOME EXERCISE PROGRAM: Today's interventions were added to her HEP.  She was encouraged to perform these interventions at least twice per day and she reported understanding.  ASSESSMENT:  CLINICAL IMPRESSION: Patient presented with mild soreness of the R shoulder. Patient progressed through therex with no reports of any increased shoulder pain. Resisted ER remains difficult with weakness and discomfort. Normal modalities response noted following removal of the modalities.  OBJECTIVE IMPAIRMENTS: decreased activity tolerance, decreased knowledge of condition, decreased ROM, decreased strength, hypomobility, impaired perceived functional ability, impaired tone, impaired UE functional use, and pain.   ACTIVITY LIMITATIONS: carrying, lifting, sleeping, reach over head, and hygiene/grooming  PARTICIPATION LIMITATIONS: meal prep, cleaning, laundry, driving, shopping, community activity, and yard work  PERSONAL FACTORS: 3+ comorbidities: Allergies, osteopenia, anxiety, chronic back pain, and history of melanoma  are also affecting patient's functional outcome.   REHAB POTENTIAL: Good  CLINICAL DECISION MAKING: Evolving/moderate complexity  EVALUATION COMPLEXITY: Moderate   GOALS: Goals reviewed with patient? Yes  SHORT TERM GOALS: Target date: 06/25/22  Patient will be independent with her initial HEP. Baseline: Goal status: MET  2.  Patient will be able to complete her daily activities without her familiar pain exceeding 8/10. Baseline:  Goal status: MET  3.  Patient will report being able to sleep in her bed without being awakened by her familiar right shoulder pain. Baseline:   Goal status: IN PROGRESS  4.  Patient will be able to demonstrate at least 110 degrees of right shoulder flexion for improved function reaching overhead. Baseline:  Goal status: IN PROGRESS  5.  Patient will be able to demonstrate at least 110 degrees of right shoulder abduction for improved function reaching. Baseline:  Goal status: IN PROGRESS  LONG TERM GOALS: Target date: 07/16/22  Patient will be independent with her advanced HEP.  Baseline:  Goal status: IN PROGRESS  2.  Patient will be able to complete her daily activities without her familiar pain exceeding 6/10. Baseline:  Goal status: IN PROGRESS  3.  Patient will be able to demonstrate at least 130 degrees of right shoulder flexion for improved function reaching overhead. Baseline:  Goal status: IN PROGRESS  4.  Patient will report being able to wash her back with her right upper extremity without needing assistance from her husband. Baseline:  Goal status: IN PROGRESS  5.  Patient will be able to demonstrate at least 120 degrees of right shoulder abduction for improved function with overhead activity. Baseline:  Goal status: IN PROGRESS  PLAN:  PT FREQUENCY: 2x/week  PT DURATION: 6 weeks  PLANNED INTERVENTIONS: Therapeutic exercises, Therapeutic activity, Neuromuscular re-education, Patient/Family education, Self Care, Joint mobilization, Electrical stimulation, Cryotherapy, Moist heat, Vasopneumatic device, Manual therapy, and Re-evaluation  PLAN FOR NEXT SESSION: Pulleys, isometrics, active assisted range of motion,  passive range of motion, and modalities as needed   Marvell Fuller, PTA 06/27/2022, 12:12 PM

## 2022-07-01 ENCOUNTER — Ambulatory Visit: Payer: PRIVATE HEALTH INSURANCE

## 2022-07-01 DIAGNOSIS — M25611 Stiffness of right shoulder, not elsewhere classified: Secondary | ICD-10-CM

## 2022-07-01 DIAGNOSIS — M25511 Pain in right shoulder: Secondary | ICD-10-CM

## 2022-07-01 NOTE — Telephone Encounter (Signed)
Called pt to reschedule colonoscopy. No answer, left vm to have pt call back to get rescheduled. Pt will need a pre-visit scheduled as well.

## 2022-07-01 NOTE — Therapy (Signed)
OUTPATIENT PHYSICAL THERAPY SHOULDER TREATMENT   Patient Name: Kristin Rasmussen MRN: [id number] DOB:Jul 27, 1960, 62 y.o., female Today's Date: 07/01/2022  END OF SESSION:  PT End of Session - 07/01/22 0950     Visit Number 9    Number of Visits 12    Date for PT Re-Evaluation 08/01/22    PT Start Time 0945    PT Stop Time 1040    PT Time Calculation (min) 55 min    Activity Tolerance Patient tolerated treatment well    Behavior During Therapy [facility] for tasks assessed/performed             Past Medical History:  Diagnosis Date   Anemia 2004   HCT 34.7   Anxiety    follows with psyc for same   Back pain    Basal cell cancer    x3; Dr Danella Deis   Bladder leak    Chest pain 06/2010   Costochondritis   Diverticulosis    Gallbladder problem    GERD (gastroesophageal reflux disease)    Hyperlipidemia    IBS (irritable bowel syndrome)    Melanoma (HCC) 2006   x1   PCOS (polycystic ovarian syndrome)    Plantar fasciitis 08/13/2011   Left is significant and RT is mild by [place] criteria    Spasm of esophagus    Swelling    Thrombocytopenia (HCC) 2002   133,000 platelets   Past Surgical History:  Procedure Laterality Date   CATARACT EXTRACTION W/PHACO Right 05/13/2021   Procedure: CATARACT EXTRACTION PHACO AND INTRAOCULAR LENS PLACEMENT (IOC);  Surgeon: Fabio Pierce, MD;  Location: [facility];  Service: Ophthalmology;  Laterality: Right;  CDE: 8.14    CATARACT EXTRACTION W/PHACO Left 05/27/2021   Procedure: CATARACT EXTRACTION PHACO AND INTRAOCULAR LENS PLACEMENT (IOC);  Surgeon: Fabio Pierce, MD;  Location: [facility];  Service: Ophthalmology;  Laterality: Left;  CDE: 6.55   CHOLECYSTECTOMY  2002   CCS   COLONOSCOPY     MASS EXCISION Right 10/22/2021   Procedure: EXCISION MASS, RIGHT FOOT;  Surgeon: Teryl Lucy, MD;  Location: [facility];  Service: Orthopedics;  Laterality: Right;   MELANOMA EXCISION     LUE 2006  CCS; Derm : Dr Campbell Stall   SHOULDER  ACROMIOPLASTY Right 05/13/2022   Procedure: SHOULDER ACROMIOPLASTY;  Surgeon: Teryl Lucy, MD;  Location: [facility];  Service: Orthopedics;  Laterality: Right;   SHOULDER ARTHROSCOPY WITH DISTAL CLAVICLE RESECTION Right 05/13/2022   Procedure: SHOULDER ARTHROSCOPY WITH DISTAL CLAVICLE EXCISION;  Surgeon: Teryl Lucy, MD;  Location: [facility];  Service: Orthopedics;  Laterality: Right;   SHOULDER SURGERY     Left   TONSILLECTOMY     TOTAL ABDOMINAL HYSTERECTOMY  1991   abnormal PAP hx   Patient Active Problem List   Diagnosis Date Noted   Hair loss 12/20/2020   Prediabetes 12/24/2019   Obesity (BMI 30-39.9) 12/20/2019   Overactive bladder 12/09/2018   Menopausal symptom 11/02/2018   Right wrist pain 12/07/2017   Insulin resistance 10/08/2016   Vitamin D deficiency 09/15/2016   Osteopenia 04/19/2015   Chronic lower back pain 09/19/2014   Anxiety    Metatarsalgia 08/13/2011   DIVERTICULOSIS, COLON 01/20/2008   SKIN CANCER, HX OF 01/20/2008   History of melanoma in situ 10/28/2006   HYPERLIPIDEMIA 10/28/2006   GERD 10/28/2006    PCP: Pincus Sanes, MD  REFERRING PROVIDER: Teryl Lucy, MD  REFERRING DIAG: S/P right shoulder scope, acromioplasty, distal clavicle excision, trephination  for calcific tendinitis  THERAPY DIAG:  Acute pain of right shoulder  Stiffness of right shoulder, not elsewhere classified  Rationale for Evaluation and Treatment: Rehabilitation  ONSET DATE: 05/13/22  SUBJECTIVE:                                                                                                                                                                                      SUBJECTIVE STATEMENT: Hasn't been able to do HEP as she was sick Wednesday and Thursday of this week and not felt well.   Hand dominance: Right  PERTINENT HISTORY: Allergies, osteopenia, anxiety, chronic back pain, and history of melanoma  PAIN:  Are you having  pain? Yes: NPRS scale: 1/10 Pain location: right shoulder Pain description: sore, tender Aggravating factors: using her arm Relieving factors: medication and heat  PRECAUTIONS: None  PATIENT GOALS: improved use of her arm, be able to do yard work, be able to shower and dry her hair  NEXT MD VISIT: 06/23/22  OBJECTIVE:   PATIENT SURVEYS:  FOTO 40.62  COGNITION: Overall cognitive status: Within functional limits for tasks assessed     SENSATION: Patient reports rare tingling around her right shoulder  POSTURE: Rounded shoulders   UPPER EXTREMITY ROM:   Active ROM Right eval Left eval  Shoulder flexion 94/ 128 (PROM); tight and sore 143  Shoulder extension    Shoulder abduction 84/ 76 (PROM) limited by pain 134  Shoulder adduction    Shoulder internal rotation  To T4  Shoulder external rotation 25 (PROM at neutral)  To T2  Elbow flexion    Elbow extension    Wrist flexion    Wrist extension    Wrist ulnar deviation    Wrist radial deviation    Wrist pronation    Wrist supination    (Blank rows = not tested)  UPPER EXTREMITY MMT: not tested due to surgical condition  SHOULDER SPECIAL TESTS: Not tested due to surgical condition  JOINT MOBILITY TESTING:  Right AC joint: hypomobile and painful Right glenohumeral: hypomobile and painful  PALPATION:  TTP: right upper trapezius, infraspinatus, supraspinatus, pectoralis minor, and deltoid   TODAY'S TREATMENT:  DATE:    5/28 EXERCISE LOG  Exercise Repetitions and Resistance Comments  UBE x10 min   Pulley X5 min   Ranger (standing) Flexion/extension; CW; CCW circles    Wall Ladder 5 reps (max 24)   Row Yellow tband x30 reps   Extension Yellow tband x30 reps   IR/ER Yellow tband x30 reps each    Blank cell = exercise not performed today   Modalities  Date: 07/01/22 Unattended  Estim: Shoulder, IFC 80-150 Hz, 15 mins, Pain Hot Pack: Shoulder, 15 mins, Pain and Tone  PATIENT EDUCATION: Education details: Plan of care, healing, prognosis, HEP, and goals for therapy Person educated: Patient and Spouse Education method: Explanation Education comprehension: verbalized understanding  HOME EXERCISE PROGRAM: Today's interventions were added to her HEP.  She was encouraged to perform these interventions at least twice per day and she reported understanding.  ASSESSMENT:  CLINICAL IMPRESSION: Pt arrives for today's treatment session reporting minimal right shoulder soreness.  Pt reports performing HEP at home as instructed with continued increased challenge with ER.  Pt able to reach number 24 today on the wall ladder demonstrating increased shoulder flexion.  Normal responses to estim and MH noted upon removal.  Pt denied any pain at completion of today's treatment session.   OBJECTIVE IMPAIRMENTS: decreased activity tolerance, decreased knowledge of condition, decreased ROM, decreased strength, hypomobility, impaired perceived functional ability, impaired tone, impaired UE functional use, and pain.   ACTIVITY LIMITATIONS: carrying, lifting, sleeping, reach over head, and hygiene/grooming  PARTICIPATION LIMITATIONS: meal prep, cleaning, laundry, driving, shopping, community activity, and yard work  PERSONAL FACTORS: 3+ comorbidities: Allergies, osteopenia, anxiety, chronic back pain, and history of melanoma  are also affecting patient's functional outcome.   REHAB POTENTIAL: Good  CLINICAL DECISION MAKING: Evolving/moderate complexity  EVALUATION COMPLEXITY: Moderate   GOALS: Goals reviewed with patient? Yes  SHORT TERM GOALS: Target date: 06/25/22  Patient will be independent with her initial HEP. Baseline: Goal status: MET  2.  Patient will be able to complete her daily activities without her familiar pain exceeding 8/10. Baseline:  Goal status: MET  3.   Patient will report being able to sleep in her bed without being awakened by her familiar right shoulder pain. Baseline: 5/28: half the night Goal status: IN PROGRESS  4.  Patient will be able to demonstrate at least 110 degrees of right shoulder flexion for improved function reaching overhead. Baseline:  Goal status: IN PROGRESS  5.  Patient will be able to demonstrate at least 110 degrees of right shoulder abduction for improved function reaching. Baseline:  Goal status: IN PROGRESS  LONG TERM GOALS: Target date: 07/16/22  Patient will be independent with her advanced HEP.  Baseline:  Goal status: IN PROGRESS  2.  Patient will be able to complete her daily activities without her familiar pain exceeding 6/10. Baseline:  Goal status: MET  3.  Patient will be able to demonstrate at least 130 degrees of right shoulder flexion for improved function reaching overhead. Baseline:  Goal status: IN PROGRESS  4.  Patient will report being able to wash her back with her right upper extremity without needing assistance from her husband. Baseline:  Goal status: IN PROGRESS  5.  Patient will be able to demonstrate at least 120 degrees of right shoulder abduction for improved function with overhead activity. Baseline:  Goal status: IN PROGRESS  PLAN:  PT FREQUENCY: 2x/week  PT DURATION: 6 weeks  PLANNED INTERVENTIONS: Therapeutic exercises, Therapeutic activity, Neuromuscular re-education, Patient/Family  education, Self Care, Joint mobilization, Electrical stimulation, Cryotherapy, Moist heat, Vasopneumatic device, Manual therapy, and Re-evaluation  PLAN FOR NEXT SESSION: Pulleys, isometrics, active assisted range of motion, passive range of motion, and modalities as needed   Newman Pies, PTA 07/01/2022, 11:01 AM

## 2022-07-02 NOTE — Telephone Encounter (Signed)
Colonoscopy and previsit scheduled for patient.  Patient aware.

## 2022-07-03 ENCOUNTER — Ambulatory Visit: Payer: PRIVATE HEALTH INSURANCE

## 2022-07-03 DIAGNOSIS — M25511 Pain in right shoulder: Secondary | ICD-10-CM

## 2022-07-03 DIAGNOSIS — M25611 Stiffness of right shoulder, not elsewhere classified: Secondary | ICD-10-CM

## 2022-07-03 NOTE — Therapy (Signed)
OUTPATIENT PHYSICAL THERAPY SHOULDER TREATMENT   Patient Name: Kristin Rasmussen MRN: [id number] DOB:10/22/1960, 62 y.o., female Today's Date: 07/03/2022  END OF SESSION:  PT End of Session - 07/03/22 0951     Visit Number 10    Number of Visits 12    Date for PT Re-Evaluation 08/01/22    PT Start Time 0949    PT Stop Time 1052    PT Time Calculation (min) 63 min    Activity Tolerance Patient tolerated treatment well    Behavior During Therapy [facility] for tasks assessed/performed             Past Medical History:  Diagnosis Date   Anemia 2004   HCT 34.7   Anxiety    follows with psyc for same   Back pain    Basal cell cancer    x3; Dr Danella Deis   Bladder leak    Chest pain 06/2010   Costochondritis   Diverticulosis    Gallbladder problem    GERD (gastroesophageal reflux disease)    Hyperlipidemia    IBS (irritable bowel syndrome)    Melanoma (HCC) 2006   x1   PCOS (polycystic ovarian syndrome)    Plantar fasciitis 08/13/2011   Left is significant and RT is mild by [place] criteria    Spasm of esophagus    Swelling    Thrombocytopenia (HCC) 2002   133,000 platelets   Past Surgical History:  Procedure Laterality Date   CATARACT EXTRACTION W/PHACO Right 05/13/2021   Procedure: CATARACT EXTRACTION PHACO AND INTRAOCULAR LENS PLACEMENT (IOC);  Surgeon: Fabio Pierce, MD;  Location: [facility];  Service: Ophthalmology;  Laterality: Right;  CDE: 8.14    CATARACT EXTRACTION W/PHACO Left 05/27/2021   Procedure: CATARACT EXTRACTION PHACO AND INTRAOCULAR LENS PLACEMENT (IOC);  Surgeon: Fabio Pierce, MD;  Location: [facility];  Service: Ophthalmology;  Laterality: Left;  CDE: 6.55   CHOLECYSTECTOMY  2002   CCS   COLONOSCOPY     MASS EXCISION Right 10/22/2021   Procedure: EXCISION MASS, RIGHT FOOT;  Surgeon: Teryl Lucy, MD;  Location: [facility];  Service: Orthopedics;  Laterality: Right;   MELANOMA EXCISION     LUE 2006  CCS; Derm : Dr Campbell Stall   SHOULDER  ACROMIOPLASTY Right 05/13/2022   Procedure: SHOULDER ACROMIOPLASTY;  Surgeon: Teryl Lucy, MD;  Location: [facility];  Service: Orthopedics;  Laterality: Right;   SHOULDER ARTHROSCOPY WITH DISTAL CLAVICLE RESECTION Right 05/13/2022   Procedure: SHOULDER ARTHROSCOPY WITH DISTAL CLAVICLE EXCISION;  Surgeon: Teryl Lucy, MD;  Location:  S[facility]ERY [facility];  Service: Orthopedics;  Laterality: Right;   SHOULDER SURGERY     Left   TONSILLECTOMY     TOTAL ABDOMINAL HYSTERECTOMY  1991   abnormal PAP hx   Patient Active Problem List   Diagnosis Date Noted   Hair loss 12/20/2020   Prediabetes 12/24/2019   Obesity (BMI 30-39.9) 12/20/2019   Overactive bladder 12/09/2018   Menopausal symptom 11/02/2018   Right wrist pain 12/07/2017   Insulin resistance 10/08/2016   Vitamin D deficiency 09/15/2016   Osteopenia 04/19/2015   Chronic lower back pain 09/19/2014   Anxiety    Metatarsalgia 08/13/2011   DIVERTICULOSIS, COLON 01/20/2008   SKIN CANCER, HX OF 01/20/2008   History of melanoma in situ 10/28/2006   HYPERLIPIDEMIA 10/28/2006   GERD 10/28/2006    PCP: Pincus Sanes, MD  REFERRING PROVIDER: Teryl Lucy, MD  REFERRING DIAG: S/P right shoulder scope, acromioplasty, distal clavicle excision, trephination  for calcific tendinitis  THERAPY DIAG:  Acute pain of right shoulder  Stiffness of right shoulder, not elsewhere classified  Rationale for Evaluation and Treatment: Rehabilitation  ONSET DATE: 05/13/22  SUBJECTIVE:                                                                                                                                                                                      SUBJECTIVE STATEMENT: Patient reports that she did some gardening with help from her husband. She notes that she was sore, but she did not do any heavy lifting.  She feels that she is about 50-60% better than she was prior to therapy.  Hand dominance:  Right  PERTINENT HISTORY: Allergies, osteopenia, anxiety, chronic back pain, and history of melanoma  PAIN:  Are you having pain? Yes: NPRS scale: 2-3/10 Pain location: right shoulder Pain description: sore, tender Aggravating factors: using her arm Relieving factors: medication and heat  PRECAUTIONS: None  PATIENT GOALS: improved use of her arm, be able to do yard work, be able to shower and dry her hair  NEXT MD VISIT: 06/23/22  OBJECTIVE:   PATIENT SURVEYS:  FOTO 40.62  COGNITION: Overall cognitive status: Within functional limits for tasks assessed     SENSATION: Patient reports rare tingling around her right shoulder  POSTURE: Rounded shoulders   UPPER EXTREMITY ROM:   Active ROM Right eval Right 07/03/22 Left eval  Shoulder flexion 94/ 128 (PROM); tight and sore 121 143  Shoulder extension     Shoulder abduction 84/ 76 (PROM) limited by pain 126 134  Shoulder adduction     Shoulder internal rotation   To T4  Shoulder external rotation 25 (PROM at neutral)   To T2  Elbow flexion     Elbow extension     Wrist flexion     Wrist extension     Wrist ulnar deviation     Wrist radial deviation     Wrist pronation     Wrist supination     (Blank rows = not tested)  UPPER EXTREMITY MMT: not tested due to surgical condition  SHOULDER SPECIAL TESTS: Not tested due to surgical condition  JOINT MOBILITY TESTING:  Right AC joint: hypomobile and painful Right glenohumeral: hypomobile and painful  PALPATION:  TTP: right upper trapezius, infraspinatus, supraspinatus, pectoralis minor, and deltoid   TODAY'S TREATMENT:  DATE:                                   5/30 EXERCISE LOG  Exercise Repetitions and Resistance Comments  UBE  X10 minutes @ 90 RPM   Wall ladder  3 minutes w/ 10 second hold Max #24  Resisted row  Red t-band x  25 reps    Resisted pull down Red t-band x 25 reps    Functional IR stretch  4 x 30 seconds  With strap  Bicep curl  5# x 20 reps    Shoulder flexion 1# x 25 reps    Resisted IR  Red t-band x 25 reps   Resisted ER  Red t-band x 25 reps    Blank cell = exercise not performed today  Modalities: no redness or adverse reaction to today's modalities   Date:  Unattended Estim: Shoulder, pre mod @ 80-150 Hz, 15 mins, Pain and Tone Hot Pack: Shoulder, 15 mins, Pain and Tone      5/28 EXERCISE LOG  Exercise Repetitions and Resistance Comments  UBE x10 min   Pulley X5 min   Ranger (standing) Flexion/extension; CW; CCW circles    Wall Ladder 5 reps (max 24)   Row Yellow tband x30 reps   Extension Yellow tband x30 reps   IR/ER Yellow tband x30 reps each    Blank cell = exercise not performed today   Modalities  Date: 07/01/22 Unattended Estim: Shoulder, IFC 80-150 Hz, 15 mins, Pain Hot Pack: Shoulder, 15 mins, Pain and Tone  PATIENT EDUCATION: Education details: Plan of care, healing, prognosis, HEP, and goals for therapy Person educated: Patient and Spouse Education method: Explanation Education comprehension: verbalized understanding  HOME EXERCISE PROGRAM: Today's interventions were added to her HEP.  She was encouraged to perform these interventions at least twice per day and she reported understanding.  ASSESSMENT:  CLINICAL IMPRESSION: Patient is making good progress with skilled physical therapy as evidenced by her subjective reports, objective measures, functional mobility, and progress toward her goals. She was able to demonstrate a significant improvement in right shoulder active range of motion since her evaluation on 06/04/22. However, she has yet to meet her long term goals for physical therapy. She was progressed with multiple new and familiar interventions for improved rotator cuff strength and mobility. She required minimal cueing with resisted external rotation for  proper positioning to isolate infraspinatus engagement. Recommend that she continue with her current plan of care to address her remaining impairments to return to her prior level of function.   OBJECTIVE IMPAIRMENTS: decreased activity tolerance, decreased knowledge of condition, decreased ROM, decreased strength, hypomobility, impaired perceived functional ability, impaired tone, impaired UE functional use, and pain.   ACTIVITY LIMITATIONS: carrying, lifting, sleeping, reach over head, and hygiene/grooming  PARTICIPATION LIMITATIONS: meal prep, cleaning, laundry, driving, shopping, community activity, and yard work  PERSONAL FACTORS: 3+ comorbidities: Allergies, osteopenia, anxiety, chronic back pain, and history of melanoma  are also affecting patient's functional outcome.   REHAB POTENTIAL: Good  CLINICAL DECISION MAKING: Evolving/moderate complexity  EVALUATION COMPLEXITY: Moderate   GOALS: Goals reviewed with patient? Yes  SHORT TERM GOALS: Target date: 06/25/22  Patient will be independent with her initial HEP. Baseline: Goal status: MET  2.  Patient will be able to complete her daily activities without her familiar pain exceeding 8/10. Baseline:  Goal status: MET  3.  Patient will report being able  to sleep in her bed without being awakened by her familiar right shoulder pain. Baseline: 5/28: half the night Goal status: IN PROGRESS  4.  Patient will be able to demonstrate at least 110 degrees of right shoulder flexion for improved function reaching overhead. Baseline:  Goal status: MET  5.  Patient will be able to demonstrate at least 110 degrees of right shoulder abduction for improved function reaching. Baseline:  Goal status: MET  LONG TERM GOALS: Target date: 07/16/22  Patient will be independent with her advanced HEP.  Baseline:  Goal status: IN PROGRESS  2.  Patient will be able to complete her daily activities without her familiar pain exceeding  6/10. Baseline:  Goal status: MET  3.  Patient will be able to demonstrate at least 130 degrees of right shoulder flexion for improved function reaching overhead. Baseline:  Goal status: IN PROGRESS  4.  Patient will report being able to wash her back with her right upper extremity without needing assistance from her husband. Baseline:  Goal status: IN PROGRESS  5.  Patient will be able to demonstrate at least 120 degrees of right shoulder abduction for improved function with overhead activity. Baseline:  Goal status: MET  PLAN:  PT FREQUENCY: 2x/week  PT DURATION: 6 weeks  PLANNED INTERVENTIONS: Therapeutic exercises, Therapeutic activity, Neuromuscular re-education, Patient/Family education, Self Care, Joint mobilization, Electrical stimulation, Cryotherapy, Moist heat, Vasopneumatic device, Manual therapy, and Re-evaluation  PLAN FOR NEXT SESSION: Pulleys, isometrics, active assisted range of motion, passive range of motion, and modalities as needed   Granville Lewis, PT 07/03/2022, 4:41 PM

## 2022-07-08 ENCOUNTER — Ambulatory Visit: Payer: No Typology Code available for payment source | Attending: Orthopedic Surgery | Admitting: Physical Therapy

## 2022-07-08 ENCOUNTER — Encounter: Payer: Self-pay | Admitting: Physical Therapy

## 2022-07-08 DIAGNOSIS — M25511 Pain in right shoulder: Secondary | ICD-10-CM | POA: Insufficient documentation

## 2022-07-08 DIAGNOSIS — M25611 Stiffness of right shoulder, not elsewhere classified: Secondary | ICD-10-CM | POA: Insufficient documentation

## 2022-07-08 NOTE — Therapy (Signed)
OUTPATIENT PHYSICAL THERAPY SHOULDER TREATMENT   Patient Name: Kristin Rasmussen MRN: [id number] DOB:01-03-1961, 62 y.o., female Today's Date: 07/08/2022  END OF SESSION:  PT End of Session - 07/08/22 0800     Visit Number 11    Number of Visits 12    Date for PT Re-Evaluation 08/01/22    PT Start Time 0800    PT Stop Time 0848    PT Time Calculation (min) 48 min    Activity Tolerance Patient tolerated treatment well    Behavior During Therapy [facility] for tasks assessed/performed            Past Medical History:  Diagnosis Date   Anemia 2004   HCT 34.7   Anxiety    follows with psyc for same   Back pain    Basal cell cancer    x3; Dr Danella Deis   Bladder leak    Chest pain 06/2010   Costochondritis   Diverticulosis    Gallbladder problem    GERD (gastroesophageal reflux disease)    Hyperlipidemia    IBS (irritable bowel syndrome)    Melanoma (HCC) 2006   x1   PCOS (polycystic ovarian syndrome)    Plantar fasciitis 08/13/2011   Left is significant and RT is mild by [place] criteria    Spasm of esophagus    Swelling    Thrombocytopenia (HCC) 2002   133,000 platelets   Past Surgical History:  Procedure Laterality Date   CATARACT EXTRACTION W/PHACO Right 05/13/2021   Procedure: CATARACT EXTRACTION PHACO AND INTRAOCULAR LENS PLACEMENT (IOC);  Surgeon: Fabio Pierce, MD;  Location: [facility];  Service: Ophthalmology;  Laterality: Right;  CDE: 8.14    CATARACT EXTRACTION W/PHACO Left 05/27/2021   Procedure: CATARACT EXTRACTION PHACO AND INTRAOCULAR LENS PLACEMENT (IOC);  Surgeon: Fabio Pierce, MD;  Location: [facility];  Service: Ophthalmology;  Laterality: Left;  CDE: 6.55   CHOLECYSTECTOMY  2002   CCS   COLONOSCOPY     MASS EXCISION Right 10/22/2021   Procedure: EXCISION MASS, RIGHT FOOT;  Surgeon: Teryl Lucy, MD;  Location: [facility];  Service: Orthopedics;  Laterality: Right;   MELANOMA EXCISION     LUE 2006  CCS; Derm : Dr Campbell Stall   SHOULDER ACROMIOPLASTY  Right 05/13/2022   Procedure: SHOULDER ACROMIOPLASTY;  Surgeon: Teryl Lucy, MD;  Location: [facility];  Service: Orthopedics;  Laterality: Right;   SHOULDER ARTHROSCOPY WITH DISTAL CLAVICLE RESECTION Right 05/13/2022   Procedure: SHOULDER ARTHROSCOPY WITH DISTAL CLAVICLE EXCISION;  Surgeon: Teryl Lucy, MD;  Location: [facility];  Service: Orthopedics;  Laterality: Right;   SHOULDER SURGERY     Left   TONSILLECTOMY     TOTAL ABDOMINAL HYSTERECTOMY  1991   abnormal PAP hx   Patient Active Problem List   Diagnosis Date Noted   Hair loss 12/20/2020   Prediabetes 12/24/2019   Obesity (BMI 30-39.9) 12/20/2019   Overactive bladder 12/09/2018   Menopausal symptom 11/02/2018   Right wrist pain 12/07/2017   Insulin resistance 10/08/2016   Vitamin D deficiency 09/15/2016   Osteopenia 04/19/2015   Chronic lower back pain 09/19/2014   Anxiety    Metatarsalgia 08/13/2011   DIVERTICULOSIS, COLON 01/20/2008   SKIN CANCER, HX OF 01/20/2008   History of melanoma in situ 10/28/2006   HYPERLIPIDEMIA 10/28/2006   GERD 10/28/2006   PCP: Pincus Sanes, MD  REFERRING PROVIDER: Teryl Lucy, MD  REFERRING DIAG: S/P right shoulder scope, acromioplasty, distal clavicle excision, trephination for calcific  tendinitis  THERAPY DIAG:  Acute pain of right shoulder  Stiffness of right shoulder, not elsewhere classified  Rationale for Evaluation and Treatment: Rehabilitation  ONSET DATE: 05/13/22  SUBJECTIVE:                                                                                                                                                                                      SUBJECTIVE STATEMENT: No new complaints. Hand dominance: Right  PERTINENT HISTORY: Allergies, osteopenia, anxiety, chronic back pain, and history of melanoma  PAIN:  Are you having pain? Yes: NPRS scale: no score provided/10 Pain location: right shoulder Pain description:  sore, tender Aggravating factors: using her arm Relieving factors: medication and heat  PRECAUTIONS: None  PATIENT GOALS: improved use of her arm, be able to do yard work, be able to shower and dry her hair  NEXT MD VISIT: 06/23/22  OBJECTIVE:   PATIENT SURVEYS:  FOTO 40.62  COGNITION: Overall cognitive status: Within functional limits for tasks assessed     SENSATION: Patient reports rare tingling around her right shoulder  POSTURE: Rounded shoulders   UPPER EXTREMITY ROM:   Active ROM Right eval Right 07/03/22 Left eval  Shoulder flexion 94/ 128 (PROM); tight and sore 121 143  Shoulder extension     Shoulder abduction 84/ 76 (PROM) limited by pain 126 134  Shoulder adduction     Shoulder internal rotation   To T4  Shoulder external rotation 25 (PROM at neutral)   To T2  Elbow flexion     Elbow extension     Wrist flexion     Wrist extension     Wrist ulnar deviation     Wrist radial deviation     Wrist pronation     Wrist supination     (Blank rows = not tested)  UPPER EXTREMITY MMT: not tested due to surgical condition  SHOULDER SPECIAL TESTS: Not tested due to surgical condition  JOINT MOBILITY TESTING:  Right AC joint: hypomobile and painful Right glenohumeral: hypomobile and painful  PALPATION:  TTP: right upper trapezius, infraspinatus, supraspinatus, pectoralis minor, and deltoid   TODAY'S TREATMENT:  DATE:  07/08/22 EXERCISE LOG  Exercise Repetitions and Resistance Comments  UBE  X10 minutes @ 90 RPM   Wall ladder  3 minutes w/ 10 second hold Max #24  Resisted row  Red t-band x 25 reps    Resisted pull down Red t-band x 25 reps    Functional IR stretch  4 x 30 seconds  With strap  Bicep curl  5# x 20 reps    Shoulder flexion 2# x 20 reps    Shoulder scaption 2# x10 reps uncomfortable  Resisted IR  Red t-band x  25 reps   Resisted ER  Red t-band x 25 reps    Blank cell = exercise not performed today  Modalities: no redness or adverse reaction to today's modalities   Date: 07/08/22 Unattended Estim: Shoulder, pre mod @ 80-150 Hz, 10 mins, Pain and Tone Hot Pack: Shoulder, 10 mins, Pain and Tone  PATIENT EDUCATION: Education details: Plan of care, healing, prognosis, HEP, and goals for therapy Person educated: Patient and Spouse Education method: Explanation Education comprehension: verbalized understanding  HOME EXERCISE PROGRAM: Today's interventions were added to her HEP.  She was encouraged to perform these interventions at least twice per day and she reported understanding.  ASSESSMENT:  CLINICAL IMPRESSION: Patient presented in clinic with reports of only intermittent discomfort from resisted ER and antigravity flexion and scaption. Mild increase in resistance provided today. Limited reps with difficult exercises due to discomfort. Normal modalities response noted following removal of the modalities.  OBJECTIVE IMPAIRMENTS: decreased activity tolerance, decreased knowledge of condition, decreased ROM, decreased strength, hypomobility, impaired perceived functional ability, impaired tone, impaired UE functional use, and pain.   ACTIVITY LIMITATIONS: carrying, lifting, sleeping, reach over head, and hygiene/grooming  PARTICIPATION LIMITATIONS: meal prep, cleaning, laundry, driving, shopping, community activity, and yard work  PERSONAL FACTORS: 3+ comorbidities: Allergies, osteopenia, anxiety, chronic back pain, and history of melanoma  are also affecting patient's functional outcome.   REHAB POTENTIAL: Good  CLINICAL DECISION MAKING: Evolving/moderate complexity  EVALUATION COMPLEXITY: Moderate  GOALS: Goals reviewed with patient? Yes  SHORT TERM GOALS: Target date: 06/25/22  Patient will be independent with her initial HEP. Baseline: Goal status: MET  2.  Patient will be able to  complete her daily activities without her familiar pain exceeding 8/10. Baseline:  Goal status: MET  3.  Patient will report being able to sleep in her bed without being awakened by her familiar right shoulder pain. Baseline: 5/28: half the night Goal status: IN PROGRESS  4.  Patient will be able to demonstrate at least 110 degrees of right shoulder flexion for improved function reaching overhead. Baseline:  Goal status: MET  5.  Patient will be able to demonstrate at least 110 degrees of right shoulder abduction for improved function reaching. Baseline:  Goal status: MET  LONG TERM GOALS: Target date: 07/16/22  Patient will be independent with her advanced HEP.  Baseline:  Goal status: IN PROGRESS  2.  Patient will be able to complete her daily activities without her familiar pain exceeding 6/10. Baseline:  Goal status: MET  3.  Patient will be able to demonstrate at least 130 degrees of right shoulder flexion for improved function reaching overhead. Baseline:  Goal status: IN PROGRESS  4.  Patient will report being able to wash her back with her right upper extremity without needing assistance from her husband. Baseline:  Goal status: IN PROGRESS  5.  Patient will be able to demonstrate at least 120 degrees of  right shoulder abduction for improved function with overhead activity. Baseline:  Goal status: MET  PLAN:  PT FREQUENCY: 2x/week  PT DURATION: 6 weeks  PLANNED INTERVENTIONS: Therapeutic exercises, Therapeutic activity, Neuromuscular re-education, Patient/Family education, Self Care, Joint mobilization, Electrical stimulation, Cryotherapy, Moist heat, Vasopneumatic device, Manual therapy, and Re-evaluation  PLAN FOR NEXT SESSION: Pulleys, isometrics, active assisted range of motion, passive range of motion, and modalities as needed   Marvell Fuller, PTA 07/08/2022, 9:00 AM

## 2022-07-10 ENCOUNTER — Ambulatory Visit: Payer: No Typology Code available for payment source

## 2022-07-10 DIAGNOSIS — M25511 Pain in right shoulder: Secondary | ICD-10-CM

## 2022-07-10 DIAGNOSIS — M25611 Stiffness of right shoulder, not elsewhere classified: Secondary | ICD-10-CM

## 2022-07-10 NOTE — Addendum Note (Signed)
Addended by: Granville Lewis on: 07/10/2022 12:50 PM   Modules accepted: Orders

## 2022-07-10 NOTE — Therapy (Signed)
OUTPATIENT PHYSICAL THERAPY SHOULDER TREATMENT   Patient Name: Kristin Rasmussen MRN: [id number] DOB:10-Feb-1960, 62 y.o., female Today's Date: 07/10/2022  END OF SESSION:  PT End of Session - 07/10/22 0809     Visit Number 12    Number of Visits 12    Date for PT Re-Evaluation 08/01/22    PT Start Time 0804    PT Stop Time 0901    PT Time Calculation (min) 57 min    Activity Tolerance Patient tolerated treatment well    Behavior During Therapy [facility] for tasks assessed/performed            Past Medical History:  Diagnosis Date   Anemia 2004   HCT 34.7   Anxiety    follows with psyc for same   Back pain    Basal cell cancer    x3; Dr Danella Deis   Bladder leak    Chest pain 06/2010   Costochondritis   Diverticulosis    Gallbladder problem    GERD (gastroesophageal reflux disease)    Hyperlipidemia    IBS (irritable bowel syndrome)    Melanoma (HCC) 2006   x1   PCOS (polycystic ovarian syndrome)    Plantar fasciitis 08/13/2011   Left is significant and RT is mild by [place] criteria    Spasm of esophagus    Swelling    Thrombocytopenia (HCC) 2002   133,000 platelets   Past Surgical History:  Procedure Laterality Date   CATARACT EXTRACTION W/PHACO Right 05/13/2021   Procedure: CATARACT EXTRACTION PHACO AND INTRAOCULAR LENS PLACEMENT (IOC);  Surgeon: Fabio Pierce, MD;  Location: [facility];  Service: Ophthalmology;  Laterality: Right;  CDE: 8.14    CATARACT EXTRACTION W/PHACO Left 05/27/2021   Procedure: CATARACT EXTRACTION PHACO AND INTRAOCULAR LENS PLACEMENT (IOC);  Surgeon: Fabio Pierce, MD;  Location: [facility];  Service: Ophthalmology;  Laterality: Left;  CDE: 6.55   CHOLECYSTECTOMY  2002   CCS   COLONOSCOPY     MASS EXCISION Right 10/22/2021   Procedure: EXCISION MASS, RIGHT FOOT;  Surgeon: Teryl Lucy, MD;  Location: [facility];  Service: Orthopedics;  Laterality: Right;   MELANOMA EXCISION     LUE 2006  CCS; Derm : Dr Campbell Stall   SHOULDER ACROMIOPLASTY  Right 05/13/2022   Procedure: SHOULDER ACROMIOPLASTY;  Surgeon: Teryl Lucy, MD;  Location: [facility];  Service: Orthopedics;  Laterality: Right;   SHOULDER ARTHROSCOPY WITH DISTAL CLAVICLE RESECTION Right 05/13/2022   Procedure: SHOULDER ARTHROSCOPY WITH DISTAL CLAVICLE EXCISION;  Surgeon: Teryl Lucy, MD;  Location: [facility];  Service: Orthopedics;  Laterality: Right;   SHOULDER SURGERY     Left   TONSILLECTOMY     TOTAL ABDOMINAL HYSTERECTOMY  1991   abnormal PAP hx   Patient Active Problem List   Diagnosis Date Noted   Hair loss 12/20/2020   Prediabetes 12/24/2019   Obesity (BMI 30-39.9) 12/20/2019   Overactive bladder 12/09/2018   Menopausal symptom 11/02/2018   Right wrist pain 12/07/2017   Insulin resistance 10/08/2016   Vitamin D deficiency 09/15/2016   Osteopenia 04/19/2015   Chronic lower back pain 09/19/2014   Anxiety    Metatarsalgia 08/13/2011   DIVERTICULOSIS, COLON 01/20/2008   SKIN CANCER, HX OF 01/20/2008   History of melanoma in situ 10/28/2006   HYPERLIPIDEMIA 10/28/2006   GERD 10/28/2006   PCP: Pincus Sanes, MD  REFERRING PROVIDER: Teryl Lucy, MD  REFERRING DIAG: S/P right shoulder scope, acromioplasty, distal clavicle excision, trephination for calcific  tendinitis  THERAPY DIAG:  Acute pain of right shoulder  Stiffness of right shoulder, not elsewhere classified  Rationale for Evaluation and Treatment: Rehabilitation  ONSET DATE: 05/13/22  SUBJECTIVE:                                                                                                                                                                                      SUBJECTIVE STATEMENT: Pt reports 2-3/10 right shoulder pain, due to cleaning her lamp post yesterday.  Hand dominance: Right  PERTINENT HISTORY: Allergies, osteopenia, anxiety, chronic back pain, and history of melanoma  PAIN:  Are you having pain? Yes: NPRS scale: 2-3/10 Pain  location: right shoulder Pain description: sore, tender Aggravating factors: using her arm Relieving factors: medication and heat  PRECAUTIONS: None  PATIENT GOALS: improved use of her arm, be able to do yard work, be able to shower and dry her hair  NEXT MD VISIT: 06/23/22  OBJECTIVE:   PATIENT SURVEYS:  FOTO 40.62  COGNITION: Overall cognitive status: Within functional limits for tasks assessed     SENSATION: Patient reports rare tingling around her right shoulder  POSTURE: Rounded shoulders   UPPER EXTREMITY ROM:   Active ROM Right eval Right 07/03/22 Left eval  Shoulder flexion 94/ 128 (PROM); tight and sore 121 143  Shoulder extension     Shoulder abduction 84/ 76 (PROM) limited by pain 126 134  Shoulder adduction     Shoulder internal rotation   To T4  Shoulder external rotation 25 (PROM at neutral)   To T2  Elbow flexion     Elbow extension     Wrist flexion     Wrist extension     Wrist ulnar deviation     Wrist radial deviation     Wrist pronation     Wrist supination     (Blank rows = not tested)  UPPER EXTREMITY MMT: not tested due to surgical condition  SHOULDER SPECIAL TESTS: Not tested due to surgical condition  JOINT MOBILITY TESTING:  Right AC joint: hypomobile and painful Right glenohumeral: hypomobile and painful  PALPATION:  TTP: right upper trapezius, infraspinatus, supraspinatus, pectoralis minor, and deltoid   TODAY'S TREATMENT:  DATE:  07/10/22 EXERCISE LOG  Exercise Repetitions and Resistance Comments  UBE  X10 minutes @ 90 RPM   Wall ladder  3 minutes w/ 10 second hold Max #24  Resisted row  Red t-band x 25 reps    Resisted pull down Red t-band x 25 reps    Functional IR stretch  5 x 30 seconds  With strap  Bicep curl  5# x 25 reps    Shoulder flexion 2# x 25 reps    Shoulder scaption 2# x15  reps uncomfortable  Resisted IR  Red t-band x 25 reps   Resisted ER  Red t-band x 25 reps    Blank cell = exercise not performed today  Modalities: no redness or adverse reaction to today's modalities   Date: 07/10/22 Unattended Estim: Shoulder, pre mod @ 80-150 Hz, 10 mins, Pain and Tone Hot Pack: Shoulder, 10 mins, Pain and Tone  PATIENT EDUCATION: Education details: Plan of care, healing, prognosis, HEP, and goals for therapy Person educated: Patient and Spouse Education method: Explanation Education comprehension: verbalized understanding  HOME EXERCISE PROGRAM: Today's interventions were added to her HEP.  She was encouraged to perform these interventions at least twice per day and she reported understanding.  ASSESSMENT:  CLINICAL IMPRESSION: Pt arrives for today's treatment session reporting 2-3/10 right shoulder.  Pt able to increase FOTO score to 68 today.  Pt able to demonstrate 131 degrees of active right shoulder flexion today meeting her LTG.  Pt with continued challenge with sleeping through the night as she has the habit of sleeping best on her right shoulder.  Pt with continued difficulty reaching behind her back with her right arm.  Pt able to tolerate increased reps with all tband exercises today with minimal fatigue.  Normal responses to estm and MH noted upon removal.   Pt would benefit from continuation of current POC to address further limitations.  Pt denied any pain at completion of today's treatment session.  OBJECTIVE IMPAIRMENTS: decreased activity tolerance, decreased knowledge of condition, decreased ROM, decreased strength, hypomobility, impaired perceived functional ability, impaired tone, impaired UE functional use, and pain.   ACTIVITY LIMITATIONS: carrying, lifting, sleeping, reach over head, and hygiene/grooming  PARTICIPATION LIMITATIONS: meal prep, cleaning, laundry, driving, shopping, community activity, and yard work  PERSONAL FACTORS: 3+  comorbidities: Allergies, osteopenia, anxiety, chronic back pain, and history of melanoma  are also affecting patient's functional outcome.   REHAB POTENTIAL: Good  CLINICAL DECISION MAKING: Evolving/moderate complexity  EVALUATION COMPLEXITY: Moderate  GOALS: Goals reviewed with patient? Yes  SHORT TERM GOALS: Target date: 06/25/22  Patient will be independent with her initial HEP. Baseline: Goal status: MET  2.  Patient will be able to complete her daily activities without her familiar pain exceeding 8/10. Baseline:  Goal status: MET  3.  Patient will report being able to sleep in her bed without being awakened by her familiar right shoulder pain. Baseline: 5/28: half the night Goal status: IN PROGRESS  4.  Patient will be able to demonstrate at least 110 degrees of right shoulder flexion for improved function reaching overhead. Baseline:  Goal status: MET  5.  Patient will be able to demonstrate at least 110 degrees of right shoulder abduction for improved function reaching. Baseline:  Goal status: MET  LONG TERM GOALS: Target date: 07/16/22  Patient will be independent with her advanced HEP.  Baseline:  Goal status: MET  2.  Patient will be able to complete her daily activities without her familiar  pain exceeding 6/10. Baseline:  Goal status: MET  3.  Patient will be able to demonstrate at least 130 degrees of right shoulder flexion for improved function reaching overhead. Baseline: 6/6: 131 degrees Goal status: MET  4.  Patient will report being able to wash her back with her right upper extremity without needing assistance from her husband. Baseline:  Goal status: IN PROGRESS  5.  Patient will be able to demonstrate at least 120 degrees of right shoulder abduction for improved function with overhead activity. Baseline:  Goal status: MET  PLAN:  PT FREQUENCY: 2x/week  PT DURATION: 6 weeks  PLANNED INTERVENTIONS: Therapeutic exercises, Therapeutic  activity, Neuromuscular re-education, Patient/Family education, Self Care, Joint mobilization, Electrical stimulation, Cryotherapy, Moist heat, Vasopneumatic device, Manual therapy, and Re-evaluation  PLAN FOR NEXT SESSION: Pulleys, isometrics, active assisted range of motion, passive range of motion, and modalities as needed   Newman Pies, PTA 07/10/2022, 9:50 AM

## 2022-07-15 ENCOUNTER — Ambulatory Visit: Payer: No Typology Code available for payment source

## 2022-07-15 DIAGNOSIS — M25511 Pain in right shoulder: Secondary | ICD-10-CM | POA: Diagnosis not present

## 2022-07-15 DIAGNOSIS — M25611 Stiffness of right shoulder, not elsewhere classified: Secondary | ICD-10-CM

## 2022-07-15 NOTE — Therapy (Signed)
OUTPATIENT PHYSICAL THERAPY SHOULDER TREATMENT   Patient Name: Kristin Rasmussen MRN: [id number] DOB:Jan 14, 1961, 62 y.o., female Today's Date: 07/15/2022  END OF SESSION:  PT End of Session - 07/15/22 0930     Visit Number 13    Number of Visits 16    Date for PT Re-Evaluation 08/01/22    PT Start Time 0930    PT Stop Time 1028    PT Time Calculation (min) 58 min    Activity Tolerance Patient tolerated treatment well    Behavior During Therapy [facility] for tasks assessed/performed            Past Medical History:  Diagnosis Date   Anemia 2004   HCT 34.7   Anxiety    follows with psyc for same   Back pain    Basal cell cancer    x3; Dr Danella Deis   Bladder leak    Chest pain 06/2010   Costochondritis   Diverticulosis    Gallbladder problem    GERD (gastroesophageal reflux disease)    Hyperlipidemia    IBS (irritable bowel syndrome)    Melanoma (HCC) 2006   x1   PCOS (polycystic ovarian syndrome)    Plantar fasciitis 08/13/2011   Left is significant and RT is mild by [place] criteria    Spasm of esophagus    Swelling    Thrombocytopenia (HCC) 2002   133,000 platelets   Past Surgical History:  Procedure Laterality Date   CATARACT EXTRACTION W/PHACO Right 05/13/2021   Procedure: CATARACT EXTRACTION PHACO AND INTRAOCULAR LENS PLACEMENT (IOC);  Surgeon: Fabio Pierce, MD;  Location: [facility];  Service: Ophthalmology;  Laterality: Right;  CDE: 8.14    CATARACT EXTRACTION W/PHACO Left 05/27/2021   Procedure: CATARACT EXTRACTION PHACO AND INTRAOCULAR LENS PLACEMENT (IOC);  Surgeon: Fabio Pierce, MD;  Location: [facility];  Service: Ophthalmology;  Laterality: Left;  CDE: 6.55   CHOLECYSTECTOMY  2002   CCS   COLONOSCOPY     MASS EXCISION Right 10/22/2021   Procedure: EXCISION MASS, RIGHT FOOT;  Surgeon: Teryl Lucy, MD;  Location: [facility];  Service: Orthopedics;  Laterality: Right;   MELANOMA EXCISION     LUE 2006  CCS; Derm : Dr Campbell Stall   SHOULDER  ACROMIOPLASTY Right 05/13/2022   Procedure: SHOULDER ACROMIOPLASTY;  Surgeon: Teryl Lucy, MD;  Location: [facility];  Service: Orthopedics;  Laterality: Right;   SHOULDER ARTHROSCOPY WITH DISTAL CLAVICLE RESECTION Right 05/13/2022   Procedure: SHOULDER ARTHROSCOPY WITH DISTAL CLAVICLE EXCISION;  Surgeon: Teryl Lucy, MD;  Location: [facility];  Service: Orthopedics;  Laterality: Right;   SHOULDER SURGERY     Left   TONSILLECTOMY     TOTAL ABDOMINAL HYSTERECTOMY  1991   abnormal PAP hx   Patient Active Problem List   Diagnosis Date Noted   Hair loss 12/20/2020   Prediabetes 12/24/2019   Obesity (BMI 30-39.9) 12/20/2019   Overactive bladder 12/09/2018   Menopausal symptom 11/02/2018   Right wrist pain 12/07/2017   Insulin resistance 10/08/2016   Vitamin D deficiency 09/15/2016   Osteopenia 04/19/2015   Chronic lower back pain 09/19/2014   Anxiety    Metatarsalgia 08/13/2011   DIVERTICULOSIS, COLON 01/20/2008   SKIN CANCER, HX OF 01/20/2008   History of melanoma in situ 10/28/2006   HYPERLIPIDEMIA 10/28/2006   GERD 10/28/2006   PCP: Pincus Sanes, MD  REFERRING PROVIDER: Teryl Lucy, MD  REFERRING DIAG: S/P right shoulder scope, acromioplasty, distal clavicle excision, trephination for calcific  tendinitis  THERAPY DIAG:  Acute pain of right shoulder  Stiffness of right shoulder, not elsewhere classified  Rationale for Evaluation and Treatment: Rehabilitation  ONSET DATE: 05/13/22  SUBJECTIVE:                                                                                                                                                                                      SUBJECTIVE STATEMENT: Pt reports 2/10 right shoulder pain today.  Pt reports working in the yard over the weekend.  Pt also states that she was able to sleep the entire night in her bed last night.    Hand dominance: Right  PERTINENT HISTORY: Allergies, osteopenia,  anxiety, chronic back pain, and history of melanoma  PAIN:  Are you having pain? Yes: NPRS scale: 2/10 Pain location: right shoulder Pain description: sore, tender Aggravating factors: using her arm Relieving factors: medication and heat  PRECAUTIONS: None  PATIENT GOALS: improved use of her arm, be able to do yard work, be able to shower and dry her hair  NEXT MD VISIT: 06/23/22  OBJECTIVE:   PATIENT SURVEYS:  FOTO 40.62  COGNITION: Overall cognitive status: Within functional limits for tasks assessed     SENSATION: Patient reports rare tingling around her right shoulder  POSTURE: Rounded shoulders   UPPER EXTREMITY ROM:   Active ROM Right eval Right 07/03/22 Left eval  Shoulder flexion 94/ 128 (PROM); tight and sore 121 143  Shoulder extension     Shoulder abduction 84/ 76 (PROM) limited by pain 126 134  Shoulder adduction     Shoulder internal rotation   To T4  Shoulder external rotation 25 (PROM at neutral)   To T2  Elbow flexion     Elbow extension     Wrist flexion     Wrist extension     Wrist ulnar deviation     Wrist radial deviation     Wrist pronation     Wrist supination     (Blank rows = not tested)  UPPER EXTREMITY MMT: not tested due to surgical condition  SHOULDER SPECIAL TESTS: Not tested due to surgical condition  JOINT MOBILITY TESTING:  Right AC joint: hypomobile and painful Right glenohumeral: hypomobile and painful  PALPATION:  TTP: right upper trapezius, infraspinatus, supraspinatus, pectoralis minor, and deltoid   TODAY'S TREATMENT:  DATE:  07/15/22 EXERCISE LOG  Exercise Repetitions and Resistance Comments  UBE  X10 minutes @ 90 RPM   Ranger 3 minutes standing, multiple directions   Resisted row  Red t-band x 30 reps    Resisted pull down Red t-band x 30 reps    Functional IR stretch  5 x 30  seconds  With strap  Bicep curl  5# x 25 reps    Shoulder flexion 2# x 30 reps    Shoulder scaption 2# x20 reps   Resisted IR  Red t-band x 30 reps   Resisted ER  Red t-band x 30 reps    Blank cell = exercise not performed today  Modalities: no redness or adverse reaction to today's modalities   Date: 07/15/22 Unattended Estim: Shoulder, pre mod @ 80-150 Hz, 15 mins, Pain and Tone Hot Pack: Shoulder, 15 mins, Pain and Tone  PATIENT EDUCATION: Education details: Plan of care, healing, prognosis, HEP, and goals for therapy Person educated: Patient and Spouse Education method: Explanation Education comprehension: verbalized understanding  HOME EXERCISE PROGRAM: Today's interventions were added to her HEP.  She was encouraged to perform these interventions at least twice per day and she reported understanding.  ASSESSMENT:  CLINICAL IMPRESSION: Pt arrives for today's treatment session reporting 2/10 right shoulder pain.  Pt reports being able to perform more activities at home without pain as her shoulder progresses.  Pt reports being able to sleep the entire night in bed for the first time since having her surgery.  Pt able to tolerate increased reps today with all exercises with minimal fatigue reported.  Normal responses to estm and MH noted upon removal.  Pt denied any pain upon completion of today's treatment session.  OBJECTIVE IMPAIRMENTS: decreased activity tolerance, decreased knowledge of condition, decreased ROM, decreased strength, hypomobility, impaired perceived functional ability, impaired tone, impaired UE functional use, and pain.   ACTIVITY LIMITATIONS: carrying, lifting, sleeping, reach over head, and hygiene/grooming  PARTICIPATION LIMITATIONS: meal prep, cleaning, laundry, driving, shopping, community activity, and yard work  PERSONAL FACTORS: 3+ comorbidities: Allergies, osteopenia, anxiety, chronic back pain, and history of melanoma  are also affecting patient's  functional outcome.   REHAB POTENTIAL: Good  CLINICAL DECISION MAKING: Evolving/moderate complexity  EVALUATION COMPLEXITY: Moderate  GOALS: Goals reviewed with patient? Yes  SHORT TERM GOALS: Target date: 06/25/22  Patient will be independent with her initial HEP. Baseline: Goal status: MET  2.  Patient will be able to complete her daily activities without her familiar pain exceeding 8/10. Baseline:  Goal status: MET  3.  Patient will report being able to sleep in her bed without being awakened by her familiar right shoulder pain. Baseline: 5/28: half the night Goal status: MET  4.  Patient will be able to demonstrate at least 110 degrees of right shoulder flexion for improved function reaching overhead. Baseline:  Goal status: MET  5.  Patient will be able to demonstrate at least 110 degrees of right shoulder abduction for improved function reaching. Baseline:  Goal status: MET  LONG TERM GOALS: Target date: 07/16/22  Patient will be independent with her advanced HEP.  Baseline:  Goal status: MET  2.  Patient will be able to complete her daily activities without her familiar pain exceeding 6/10. Baseline:  Goal status: MET  3.  Patient will be able to demonstrate at least 130 degrees of right shoulder flexion for improved function reaching overhead. Baseline: 6/6: 131 degrees Goal status: MET  4.  Patient will  report being able to wash her back with her right upper extremity without needing assistance from her husband. Baseline:  Goal status: IN PROGRESS  5.  Patient will be able to demonstrate at least 120 degrees of right shoulder abduction for improved function with overhead activity. Baseline:  Goal status: MET  PLAN:  PT FREQUENCY: 2x/week  PT DURATION: 6 weeks  PLANNED INTERVENTIONS: Therapeutic exercises, Therapeutic activity, Neuromuscular re-education, Patient/Family education, Self Care, Joint mobilization, Electrical stimulation, Cryotherapy,  Moist heat, Vasopneumatic device, Manual therapy, and Re-evaluation  PLAN FOR NEXT SESSION: Pulleys, isometrics, active assisted range of motion, passive range of motion, and modalities as needed   Newman Pies, PTA 07/15/2022, 11:20 AM

## 2022-07-16 ENCOUNTER — Encounter: Payer: Self-pay | Admitting: Adult Health

## 2022-07-16 ENCOUNTER — Ambulatory Visit: Payer: No Typology Code available for payment source | Admitting: Adult Health

## 2022-07-16 DIAGNOSIS — F411 Generalized anxiety disorder: Secondary | ICD-10-CM

## 2022-07-16 DIAGNOSIS — G4700 Insomnia, unspecified: Secondary | ICD-10-CM

## 2022-07-16 DIAGNOSIS — F331 Major depressive disorder, recurrent, moderate: Secondary | ICD-10-CM | POA: Diagnosis not present

## 2022-07-16 MED ORDER — ESCITALOPRAM OXALATE 20 MG PO TABS
20.0000 mg | ORAL_TABLET | Freq: Every day | ORAL | 3 refills | Status: AC
Start: 2022-07-16 — End: ?

## 2022-07-16 MED ORDER — ALPRAZOLAM 0.25 MG PO TABS
0.2500 mg | ORAL_TABLET | Freq: Three times a day (TID) | ORAL | 2 refills | Status: DC | PRN
Start: 2022-07-16 — End: 2022-12-11

## 2022-07-16 NOTE — Progress Notes (Signed)
Kristin Rasmussen [id number] 01/16/1961 62 y.o.  Subjective:   Patient ID:  Kristin Rasmussen is a 62 y.o. (DOB 06/22/1960) female.  Chief Complaint: No chief complaint on file.   HPI Kristin Rasmussen presents to the office today for follow-up of MDD, GAD, and insomnia.  Describes mood today as "ok". Pleasant. Mood symptoms - reports decreased depression and irritability. Reports anxiety at times - more situational. Reports some worry, rumination, and over thinking "at times". Stating "I feel like I'm doing alright". Feels like medications are helpful. Family doing well. She and sister's relationship strained. Father moved from [place] and is living with sister. Stable interest and motivation. Taking medications as prescribed.  Energy levels stable. Active, does not have a regular exercise routine. Working in the yard. Enjoys some usual interests and activities. Married. Lives with husband. Spending time with family.  Appetite adequate. Weight gain since retirement 4 years ago. Sleeps well most nights. Averages 6 hours a night. Focus and concentration stable. Completing tasks. anaging aspects of household. Retired. Denies SI or HI.  Denies AH or VH.     GAD-7    Flowsheet Row Office Visit from 12/20/2020 in [facility]  Total GAD-7 Score 3      PHQ2-9    Flowsheet Row Office Visit from 12/23/2021 in [facility] at [facility] Office Visit from 12/20/2020 in [facility] Office Visit from 12/20/2019 in [facility] Office Visit from 12/07/2017 in [facility] Office Visit from 06/03/2016 in [facility] Total Score 1 1 1  0 2  PHQ-9 Total Score 4 1 1  0 6      Flowsheet Row Admission (Discharged) from 05/13/2022 in [facility] Admission (Discharged) from 05/27/2021 in [facility] Pre-Admission  Testing 45 from 05/20/2021 in [facility]  C-SSRS RISK CATEGORY No Risk No Risk No Risk        Review of Systems:  Review of Systems  Musculoskeletal:  Negative for gait problem.  Neurological:  Negative for tremors.  Psychiatric/Behavioral:         Please refer to HPI    Medications: I have reviewed the patient's current medications.  Current Outpatient Medications  Medication Sig Dispense Refill   ALPRAZolam (XANAX) 0.25 MG tablet TAKE 1 TABLET BY MOUTH THREE TIMES DAILY AS NEEDED FOR ANXIETY 90 tablet 3   baclofen (LIORESAL) 10 MG tablet Take 1 tablet (10 mg total) by mouth 3 (three) times daily. (Patient not taking: Reported on 06/04/2022) 120 tablet 3   calcium citrate-vitamin D (CITRACAL+D) 315-200 MG-UNIT tablet Take 1 tablet by mouth 2 (two) times daily.     cetirizine (ZYRTEC) 10 MG tablet Take 10 mg by mouth daily.     Cholecalciferol (VITAMIN D3) 50 MCG (2000 UT) capsule Take 2,000 Units by mouth daily.     docusate sodium (COLACE) 100 MG capsule Take 100 mg by mouth daily as needed for mild constipation.     DOTTI 0.1 MG/24HR patch Place onto the skin.     escitalopram (LEXAPRO) 20 MG tablet Take 1 tablet (20 mg total) by mouth daily. 90 tablet 3   esomeprazole (NEXIUM) 40 MG capsule Take 1 capsule by mouth once daily 90 capsule 0   Magnesium 400 MG TABS Take by mouth daily.     oxybutynin (DITROPAN XL) 15 MG 24 hr tablet  pravastatin (PRAVACHOL) 20 MG tablet Take 1 tablet by mouth once daily 90 tablet 2   Prenatal Vit-Fe Fumarate-FA (PRENATAL VITAMIN PO) Take by mouth daily.     No current facility-administered medications for this visit.    Medication Side Effects: None  Allergies:  Allergies  Allergen Reactions   Tape     Surgical tape gives her blisters    Past Medical History:  Diagnosis Date   Anemia 2004   HCT 34.7   Anxiety    follows with psyc for same   Back pain    Basal cell cancer    x3; Dr Danella Deis   Bladder leak     Chest pain 06/2010   Costochondritis   Diverticulosis    Gallbladder problem    GERD (gastroesophageal reflux disease)    Hyperlipidemia    IBS (irritable bowel syndrome)    Melanoma (HCC) 2006   x1   PCOS (polycystic ovarian syndrome)    Plantar fasciitis 08/13/2011   Left is significant and RT is mild by [place] criteria    Spasm of esophagus    Swelling    Thrombocytopenia (HCC) 2002   133,000 platelets    Past Medical History, Surgical history, Social history, and Family history were reviewed and updated as appropriate.   Please see review of systems for further details on the patient's review from today.   Objective:   Physical Exam:  There were no vitals taken for this visit.  Physical Exam Constitutional:      General: She is not in acute distress. Musculoskeletal:        General: No deformity.  Neurological:     Mental Status: She is alert and oriented to person, place, and time.     Coordination: Coordination normal.  Psychiatric:        Attention and Perception: Attention and perception normal. She does not perceive auditory or visual hallucinations.        Mood and Affect: Mood normal. Mood is not anxious or depressed. Affect is not labile, blunt, angry or inappropriate.        Speech: Speech normal.        Behavior: Behavior normal.        Thought Content: Thought content normal. Thought content is not paranoid or delusional. Thought content does not include homicidal or suicidal ideation. Thought content does not include homicidal or suicidal plan.        Cognition and Memory: Cognition and memory normal.        Judgment: Judgment normal.     Comments: Insight intact     Lab Review:     Component Value Date/Time   NA 134 (L) 12/17/2021 0853   NA 140 06/02/2017 1124   K 3.8 12/17/2021 0853   CL 98 12/17/2021 0853   CO2 31 12/17/2021 0853   GLUCOSE 95 12/17/2021 0853   BUN 8 12/17/2021 0853   BUN 13 06/02/2017 1124   CREATININE 0.58 12/17/2021 0853    CALCIUM 8.8 12/17/2021 0853   PROT 6.6 12/17/2021 0853   PROT 6.6 06/02/2017 1124   ALBUMIN 4.1 12/17/2021 0853   ALBUMIN 4.4 06/02/2017 1124   AST 17 12/17/2021 0853   ALT 18 12/17/2021 0853   ALKPHOS 39 12/17/2021 0853   BILITOT 0.4 12/17/2021 0853   BILITOT 0.2 06/02/2017 1124   GFRNONAA 109 06/02/2017 1124   GFRAA 125 06/02/2017 1124       Component Value Date/Time   WBC 6.9 12/17/2021 0853  RBC 5.00 12/17/2021 0853   HGB 15.2 (H) 12/17/2021 0853   HGB 14.6 06/03/2016 1253   HGB 14.2 03/29/2010 1506   HCT 45.0 12/17/2021 0853   HCT 43.3 06/03/2016 1253   HCT 41.3 03/29/2010 1506   PLT 194.0 12/17/2021 0853   PLT 221 03/29/2010 1506   MCV 90.0 12/17/2021 0853   MCV 88 06/03/2016 1253   MCV 88.4 03/29/2010 1506   MCH 29.6 06/03/2016 1253   MCH 29.8 06/28/2010 0355   MCHC 33.7 12/17/2021 0853   RDW 14.2 12/17/2021 0853   RDW 14.5 06/03/2016 1253   RDW 13.6 03/29/2010 1506   LYMPHSABS 1.3 12/17/2021 0853   LYMPHSABS 1.5 06/03/2016 1253   LYMPHSABS 1.6 03/29/2010 1506   MONOABS 0.3 12/17/2021 0853   MONOABS 0.4 03/29/2010 1506   EOSABS 0.2 12/17/2021 0853   EOSABS 0.2 06/03/2016 1253   BASOSABS 0.0 12/17/2021 0853   BASOSABS 0.0 06/03/2016 1253   BASOSABS 0.1 03/29/2010 1506    No results found for: "POCLITH", "LITHIUM"   No results found for: "PHENYTOIN", "PHENOBARB", "VALPROATE", "CBMZ"   .res Assessment: Plan:    Plan:  1. Lexapro 20mg  daily 2. Xanax 0.25mg  TID  RTC 1 year  Patient advised to contact office with any questions, adverse effects, or acute worsening in signs and symptoms.  Discussed potential benefits, risk, and side effects of benzodiazepines to include potential risk of tolerance and dependence, as well as possible drowsiness. Advised patient not to drive if experiencing drowsiness and to take lowest possible effective dose to minimize risk of dependence and tolerance.  There are no diagnoses linked to this encounter.   Please see  After Visit Summary for patient specific instructions.  Future Appointments  Date Time Provider Department Center  07/16/2022  9:40 AM Yailine Ballard, Thereasa Solo, NP [facility] None  07/18/2022  9:30 AM Marvell Fuller, PTA [facility]  07/21/2022  9:30 AM Granville Lewis, PT [facility]  07/23/2022  1:30 PM [facility] PREVISIT RM 52 [facility]  07/30/2022  9:30 AM Marvell Fuller, PTA [facility]  08/01/2022  9:30 AM Hilarie Fredrickson, MD [facility]  12/17/2022  9:00 AM [facility] None  12/25/2022  9:10 AM Burns, Bobette Mo, MD [facility] None    No orders of the defined types were placed in this encounter.   -------------------------------

## 2022-07-18 ENCOUNTER — Ambulatory Visit: Payer: No Typology Code available for payment source | Admitting: Physical Therapy

## 2022-07-18 ENCOUNTER — Encounter: Payer: Self-pay | Admitting: Physical Therapy

## 2022-07-18 DIAGNOSIS — M25611 Stiffness of right shoulder, not elsewhere classified: Secondary | ICD-10-CM

## 2022-07-18 DIAGNOSIS — M25511 Pain in right shoulder: Secondary | ICD-10-CM

## 2022-07-18 NOTE — Therapy (Signed)
OUTPATIENT PHYSICAL THERAPY SHOULDER TREATMENT   Patient Name: Kristin Rasmussen MRN: [id number] DOB:10/19/1960, 62 y.o., female Today's Date: 07/18/2022  END OF SESSION:  PT End of Session - 07/18/22 0940     Visit Number 14    Number of Visits 16    Date for PT Re-Evaluation 08/01/22    PT Start Time 0937    PT Stop Time 1027    PT Time Calculation (min) 50 min    Activity Tolerance Patient tolerated treatment well    Behavior During Therapy [facility] for tasks assessed/performed            Past Medical History:  Diagnosis Date   Anemia 2004   HCT 34.7   Anxiety    follows with psyc for same   Back pain    Basal cell cancer    x3; Dr Danella Deis   Bladder leak    Chest pain 06/2010   Costochondritis   Diverticulosis    Gallbladder problem    GERD (gastroesophageal reflux disease)    Hyperlipidemia    IBS (irritable bowel syndrome)    Melanoma (HCC) 2006   x1   PCOS (polycystic ovarian syndrome)    Plantar fasciitis 08/13/2011   Left is significant and RT is mild by [place] criteria    Spasm of esophagus    Swelling    Thrombocytopenia (HCC) 2002   133,000 platelets   Past Surgical History:  Procedure Laterality Date   CATARACT EXTRACTION W/PHACO Right 05/13/2021   Procedure: CATARACT EXTRACTION PHACO AND INTRAOCULAR LENS PLACEMENT (IOC);  Surgeon: Fabio Pierce, MD;  Location: [facility];  Service: Ophthalmology;  Laterality: Right;  CDE: 8.14    CATARACT EXTRACTION W/PHACO Left 05/27/2021   Procedure: CATARACT EXTRACTION PHACO AND INTRAOCULAR LENS PLACEMENT (IOC);  Surgeon: Fabio Pierce, MD;  Location: [facility];  Service: Ophthalmology;  Laterality: Left;  CDE: 6.55   CHOLECYSTECTOMY  2002   CCS   COLONOSCOPY     MASS EXCISION Right 10/22/2021   Procedure: EXCISION MASS, RIGHT FOOT;  Surgeon: Teryl Lucy, MD;  Location: [facility];  Service: Orthopedics;  Laterality: Right;   MELANOMA EXCISION     LUE 2006  CCS; Derm : Dr Campbell Stall   SHOULDER  ACROMIOPLASTY Right 05/13/2022   Procedure: SHOULDER ACROMIOPLASTY;  Surgeon: Teryl Lucy, MD;  Location: [facility];  Service: Orthopedics;  Laterality: Right;   SHOULDER ARTHROSCOPY WITH DISTAL CLAVICLE RESECTION Right 05/13/2022   Procedure: SHOULDER ARTHROSCOPY WITH DISTAL CLAVICLE EXCISION;  Surgeon: Teryl Lucy, MD;  Location: [facility];  Service: Orthopedics;  Laterality: Right;   SHOULDER SURGERY     Left   TONSILLECTOMY     TOTAL ABDOMINAL HYSTERECTOMY  1991   abnormal PAP hx   Patient Active Problem List   Diagnosis Date Noted   Hair loss 12/20/2020   Prediabetes 12/24/2019   Obesity (BMI 30-39.9) 12/20/2019   Overactive bladder 12/09/2018   Menopausal symptom 11/02/2018   Right wrist pain 12/07/2017   Insulin resistance 10/08/2016   Vitamin D deficiency 09/15/2016   Osteopenia 04/19/2015   Chronic lower back pain 09/19/2014   Anxiety    Metatarsalgia 08/13/2011   DIVERTICULOSIS, COLON 01/20/2008   SKIN CANCER, HX OF 01/20/2008   History of melanoma in situ 10/28/2006   HYPERLIPIDEMIA 10/28/2006   GERD 10/28/2006   PCP: Pincus Sanes, MD  REFERRING PROVIDER: Teryl Lucy, MD  REFERRING DIAG: S/P right shoulder scope, acromioplasty, distal clavicle excision, trephination for calcific  tendinitis  THERAPY DIAG:  Acute pain of right shoulder  Stiffness of right shoulder, not elsewhere classified  Rationale for Evaluation and Treatment: Rehabilitation  ONSET DATE: 05/13/22  SUBJECTIVE:                                                                                                                                                                                      SUBJECTIVE STATEMENT: Little soreness reported.  Hand dominance: Right  PERTINENT HISTORY: Allergies, osteopenia, anxiety, chronic back pain, and history of melanoma  PAIN:  Are you having pain? Yes: NPRS scale: 1-2/10 Pain location: right shoulder Pain  description: sore Aggravating factors: using her arm Relieving factors: medication and heat  PRECAUTIONS: None  PATIENT GOALS: improved use of her arm, be able to do yard work, be able to shower and dry her hair  NEXT MD VISIT: 06/23/22  OBJECTIVE:   PATIENT SURVEYS:  FOTO 40.62  COGNITION: Overall cognitive status: Within functional limits for tasks assessed     SENSATION: Patient reports rare tingling around her right shoulder  POSTURE: Rounded shoulders   UPPER EXTREMITY ROM:   Active ROM Right eval Right 07/03/22 Left eval  Shoulder flexion 94/ 128 (PROM); tight and sore 121 143  Shoulder extension     Shoulder abduction 84/ 76 (PROM) limited by pain 126 134  Shoulder adduction     Shoulder internal rotation   To T4  Shoulder external rotation 25 (PROM at neutral)   To T2  Elbow flexion     Elbow extension     Wrist flexion     Wrist extension     Wrist ulnar deviation     Wrist radial deviation     Wrist pronation     Wrist supination     (Blank rows = not tested)  UPPER EXTREMITY MMT: not tested due to surgical condition  SHOULDER SPECIAL TESTS: Not tested due to surgical condition  JOINT MOBILITY TESTING:  Right AC joint: hypomobile and painful Right glenohumeral: hypomobile and painful  PALPATION:  TTP: right upper trapezius, infraspinatus, supraspinatus, pectoralis minor, and deltoid   TODAY'S TREATMENT:  DATE:  07/17/22 EXERCISE LOG  Exercise Repetitions and Resistance Comments  UBE  X10 minutes @ 60 RPM   Wall wash X fatigue CW and CCW   Resisted row  Red t-band x 30 reps    Resisted pull down Red t-band x 30 reps    Shoulder flexion 3# x 20 reps    Resisted IR  Red t-band x 30 reps   Resisted ER  Red t-band x 30 reps   Wall clock X8 reps   Wall pushups 2x 15 reps    Blank cell = exercise not performed  today  Modalities: no redness or adverse reaction to today's modalities   Date: 07/17/22 Unattended Estim: Shoulder, pre mod @ 80-150 Hz, 10 mins, Pain and Tone Hot Pack: Shoulder, 10 mins, Pain and Tone  PATIENT EDUCATION: Education details: Plan of care, healing, prognosis, HEP, and goals for therapy Person educated: Patient and Spouse Education method: Explanation Education comprehension: verbalized understanding  HOME EXERCISE PROGRAM: Today's interventions were added to her HEP.  She was encouraged to perform these interventions at least twice per day and she reported understanding.  ASSESSMENT:  CLINICAL IMPRESSION: Patient presented in clinic with mild L shoulder soreness. Patient progressed for scapular stabilization and shoulder strengthening. Rest breaks required for muscle fatigue. Patient able to complete therex with minimal technique correction by PTA required. Patient has also purchased the TENS machine and will bring in next week for education and training. Normal modalities response noted following removal of the modalities.  OBJECTIVE IMPAIRMENTS: decreased activity tolerance, decreased knowledge of condition, decreased ROM, decreased strength, hypomobility, impaired perceived functional ability, impaired tone, impaired UE functional use, and pain.   ACTIVITY LIMITATIONS: carrying, lifting, sleeping, reach over head, and hygiene/grooming  PARTICIPATION LIMITATIONS: meal prep, cleaning, laundry, driving, shopping, community activity, and yard work  PERSONAL FACTORS: 3+ comorbidities: Allergies, osteopenia, anxiety, chronic back pain, and history of melanoma  are also affecting patient's functional outcome.   REHAB POTENTIAL: Good  CLINICAL DECISION MAKING: Evolving/moderate complexity  EVALUATION COMPLEXITY: Moderate  GOALS: Goals reviewed with patient? Yes  SHORT TERM GOALS: Target date: 06/25/22  Patient will be independent with her initial  HEP. Baseline: Goal status: MET  2.  Patient will be able to complete her daily activities without her familiar pain exceeding 8/10. Baseline:  Goal status: MET  3.  Patient will report being able to sleep in her bed without being awakened by her familiar right shoulder pain. Baseline: 5/28: half the night Goal status: MET  4.  Patient will be able to demonstrate at least 110 degrees of right shoulder flexion for improved function reaching overhead. Baseline:  Goal status: MET  5.  Patient will be able to demonstrate at least 110 degrees of right shoulder abduction for improved function reaching. Baseline:  Goal status: MET  LONG TERM GOALS: Target date: 07/16/22  Patient will be independent with her advanced HEP.  Baseline:  Goal status: MET  2.  Patient will be able to complete her daily activities without her familiar pain exceeding 6/10. Baseline:  Goal status: MET  3.  Patient will be able to demonstrate at least 130 degrees of right shoulder flexion for improved function reaching overhead. Baseline: 6/6: 131 degrees Goal status: MET  4.  Patient will report being able to wash her back with her right upper extremity without needing assistance from her husband. Baseline:  Goal status: IN PROGRESS  5.  Patient will be able to demonstrate at least 120 degrees of  right shoulder abduction for improved function with overhead activity. Baseline:  Goal status: MET  PLAN:  PT FREQUENCY: 2x/week  PT DURATION: 6 weeks  PLANNED INTERVENTIONS: Therapeutic exercises, Therapeutic activity, Neuromuscular re-education, Patient/Family education, Self Care, Joint mobilization, Electrical stimulation, Cryotherapy, Moist heat, Vasopneumatic device, Manual therapy, and Re-evaluation  PLAN FOR NEXT SESSION: Provide TENS education   Marvell Fuller, PTA 07/18/2022, 10:30 AM

## 2022-07-21 ENCOUNTER — Ambulatory Visit: Payer: No Typology Code available for payment source

## 2022-07-21 DIAGNOSIS — M25511 Pain in right shoulder: Secondary | ICD-10-CM

## 2022-07-21 DIAGNOSIS — M25611 Stiffness of right shoulder, not elsewhere classified: Secondary | ICD-10-CM

## 2022-07-21 NOTE — Therapy (Signed)
OUTPATIENT PHYSICAL THERAPY SHOULDER TREATMENT   Patient Name: Kristin Rasmussen MRN: [id number] DOB:1960-08-08, 62 y.o., female Today's Date: 07/21/2022  END OF SESSION:  PT End of Session - 07/21/22 0936     Visit Number 15    Number of Visits 16    Date for PT Re-Evaluation 08/01/22    PT Start Time 0930    PT Stop Time 1025    PT Time Calculation (min) 55 min    Activity Tolerance Patient tolerated treatment well    Behavior During Therapy [facility] for tasks assessed/performed            Past Medical History:  Diagnosis Date   Anemia 2004   HCT 34.7   Anxiety    follows with psyc for same   Back pain    Basal cell cancer    x3; Dr Danella Deis   Bladder leak    Chest pain 06/2010   Costochondritis   Diverticulosis    Gallbladder problem    GERD (gastroesophageal reflux disease)    Hyperlipidemia    IBS (irritable bowel syndrome)    Melanoma (HCC) 2006   x1   PCOS (polycystic ovarian syndrome)    Plantar fasciitis 08/13/2011   Left is significant and RT is mild by [place] criteria    Spasm of esophagus    Swelling    Thrombocytopenia (HCC) 2002   133,000 platelets   Past Surgical History:  Procedure Laterality Date   CATARACT EXTRACTION W/PHACO Right 05/13/2021   Procedure: CATARACT EXTRACTION PHACO AND INTRAOCULAR LENS PLACEMENT (IOC);  Surgeon: Fabio Pierce, MD;  Location: [facility];  Service: Ophthalmology;  Laterality: Right;  CDE: 8.14    CATARACT EXTRACTION W/PHACO Left 05/27/2021   Procedure: CATARACT EXTRACTION PHACO AND INTRAOCULAR LENS PLACEMENT (IOC);  Surgeon: Fabio Pierce, MD;  Location: [facility];  Service: Ophthalmology;  Laterality: Left;  CDE: 6.55   CHOLECYSTECTOMY  2002   CCS   COLONOSCOPY     MASS EXCISION Right 10/22/2021   Procedure: EXCISION MASS, RIGHT FOOT;  Surgeon: Teryl Lucy, MD;  Location: [facility];  Service: Orthopedics;  Laterality: Right;   MELANOMA EXCISION     LUE 2006  CCS; Derm : Dr Campbell Stall   SHOULDER  ACROMIOPLASTY Right 05/13/2022   Procedure: SHOULDER ACROMIOPLASTY;  Surgeon: Teryl Lucy, MD;  Location: [facility];  Service: Orthopedics;  Laterality: Right;   SHOULDER ARTHROSCOPY WITH DISTAL CLAVICLE RESECTION Right 05/13/2022   Procedure: SHOULDER ARTHROSCOPY WITH DISTAL CLAVICLE EXCISION;  Surgeon: Teryl Lucy, MD;  Location:  SURGERY CENTER;  Service: Orthopedics;  Laterality: Right;   SHOULDER SURGERY     Left   TONSILLECTOMY     TOTAL ABDOMINAL HYSTERECTOMY  1991   abnormal PAP hx   Patient Active Problem List   Diagnosis Date Noted   Hair loss 12/20/2020   Prediabetes 12/24/2019   Obesity (BMI 30-39.9) 12/20/2019   Overactive bladder 12/09/2018   Menopausal symptom 11/02/2018   Right wrist pain 12/07/2017   Insulin resistance 10/08/2016   Vitamin D deficiency 09/15/2016   Osteopenia 04/19/2015   Chronic lower back pain 09/19/2014   Anxiety    Metatarsalgia 08/13/2011   DIVERTICULOSIS, COLON 01/20/2008   SKIN CANCER, HX OF 01/20/2008   History of melanoma in situ 10/28/2006   HYPERLIPIDEMIA 10/28/2006   GERD 10/28/2006   PCP: Pincus Sanes, MD  REFERRING PROVIDER: Teryl Lucy, MD  REFERRING DIAG: S/P right shoulder scope, acromioplasty, distal clavicle excision, trephination for calcific  tendinitis  THERAPY DIAG:  Acute pain of right shoulder  Stiffness of right shoulder, not elsewhere classified  Rationale for Evaluation and Treatment: Rehabilitation  ONSET DATE: 05/13/22  SUBJECTIVE:                                                                                                                                                                                      SUBJECTIVE STATEMENT: Patient reports that her shoulder is a little sore today, but it feels good.   Hand dominance: Right  PERTINENT HISTORY: Allergies, osteopenia, anxiety, chronic back pain, and history of melanoma  PAIN:  Are you having pain? Yes: NPRS scale:  1/10 Pain location: right shoulder Pain description: sore Aggravating factors: using her arm Relieving factors: medication and heat  PRECAUTIONS: None  PATIENT GOALS: improved use of her arm, be able to do yard work, be able to shower and dry her hair  NEXT MD VISIT: 07/28/22  OBJECTIVE:   PATIENT SURVEYS:  FOTO 40.62  COGNITION: Overall cognitive status: Within functional limits for tasks assessed     SENSATION: Patient reports rare tingling around her right shoulder  POSTURE: Rounded shoulders   UPPER EXTREMITY ROM:   Active ROM Right eval Right 07/03/22 Left eval  Shoulder flexion 94/ 128 (PROM); tight and sore 121 143  Shoulder extension     Shoulder abduction 84/ 76 (PROM) limited by pain 126 134  Shoulder adduction     Shoulder internal rotation   To T4  Shoulder external rotation 25 (PROM at neutral)   To T2  Elbow flexion     Elbow extension     Wrist flexion     Wrist extension     Wrist ulnar deviation     Wrist radial deviation     Wrist pronation     Wrist supination     (Blank rows = not tested)  UPPER EXTREMITY MMT: not tested due to surgical condition  SHOULDER SPECIAL TESTS: Not tested due to surgical condition  JOINT MOBILITY TESTING:  Right AC joint: hypomobile and painful Right glenohumeral: hypomobile and painful  PALPATION:  TTP: right upper trapezius, infraspinatus, supraspinatus, pectoralis minor, and deltoid   TODAY'S TREATMENT:  DATE:                                   07/21/22 EXERCISE LOG  Exercise Repetitions and Resistance Comments  UBE X10 minutes @ 90 RPM   Resisted IR  Green t-band x 30 reps    Resisted ER  Green t-band x 30 reps    Functional IR stretch  4 x 30 seconds hold RUE  Posterior lift off  30 reps  RUE  ABD wall taps  3# x 10 reps    Resisted pull down  Blue XTS x 2 minutes    Wall ball  3# x 2 x 1 minute         Blank cell = exercise not performed today     07/17/22 EXERCISE LOG  Exercise Repetitions and Resistance Comments  UBE  X10 minutes @ 60 RPM   Wall wash X fatigue CW and CCW   Resisted row  Red t-band x 30 reps    Resisted pull down Red t-band x 30 reps    Shoulder flexion 3# x 20 reps    Resisted IR  Red t-band x 30 reps   Resisted ER  Red t-band x 30 reps   Wall clock X8 reps   Wall pushups 2x 15 reps    Blank cell = exercise not performed today  Modalities: no redness or adverse reaction to today's modalities   Date: 07/17/22 Unattended Estim: Shoulder, pre mod @ 80-150 Hz, 10 mins, Pain and Tone Hot Pack: Shoulder, 10 mins, Pain and Tone  PATIENT EDUCATION: Education details: Plan of care, healing, prognosis, HEP, and goals for therapy Person educated: Patient and Spouse Education method: Explanation Education comprehension: verbalized understanding  HOME EXERCISE PROGRAM:   ASSESSMENT:  CLINICAL IMPRESSION: Patient is making excellent progress with skilled physical therapy as evidenced by her subjective reports, functional mobility, and progress toward her goals. She was able to demonstrate improved strength and functional mobility with today's interventions. She was progressed with posterior lift offs in addition to familiar interventions with moderate difficulty. She required moderated cueing with posterior lift offs to maintain upright stance to facilitate infraspinatus engagement. Fatigue was her primary limitation with today's interventions as she required a brief rest break after today's new interventions. She was education the proper use of her TENS machine at home which reported understanding. She will potentially be discharged at her next appointment with an updated HEP, barring a change in status.     OBJECTIVE IMPAIRMENTS: decreased activity tolerance, decreased knowledge of condition, decreased ROM, decreased strength,  hypomobility, impaired perceived functional ability, impaired tone, impaired UE functional use, and pain.   ACTIVITY LIMITATIONS: carrying, lifting, sleeping, reach over head, and hygiene/grooming  PARTICIPATION LIMITATIONS: meal prep, cleaning, laundry, driving, shopping, community activity, and yard work  PERSONAL FACTORS: 3+ comorbidities: Allergies, osteopenia, anxiety, chronic back pain, and history of melanoma  are also affecting patient's functional outcome.   REHAB POTENTIAL: Good  CLINICAL DECISION MAKING: Evolving/moderate complexity  EVALUATION COMPLEXITY: Moderate  GOALS: Goals reviewed with patient? Yes  SHORT TERM GOALS: Target date: 06/25/22  Patient will be independent with her initial HEP. Baseline: Goal status: MET  2.  Patient will be able to complete her daily activities without her familiar pain exceeding 8/10. Baseline:  Goal status: MET  3.  Patient will report being able to sleep in her bed without being awakened by her familiar right  shoulder pain. Baseline: 5/28: half the night Goal status: MET  4.  Patient will be able to demonstrate at least 110 degrees of right shoulder flexion for improved function reaching overhead. Baseline:  Goal status: MET  5.  Patient will be able to demonstrate at least 110 degrees of right shoulder abduction for improved function reaching. Baseline:  Goal status: MET  LONG TERM GOALS: Target date: 07/16/22  Patient will be independent with her advanced HEP.  Baseline:  Goal status: MET  2.  Patient will be able to complete her daily activities without her familiar pain exceeding 6/10. Baseline:  Goal status: MET  3.  Patient will be able to demonstrate at least 130 degrees of right shoulder flexion for improved function reaching overhead. Baseline: 6/6: 131 degrees Goal status: MET  4.  Patient will report being able to wash her back with her right upper extremity without needing assistance from her  husband. Baseline:  Goal status: IN PROGRESS  5.  Patient will be able to demonstrate at least 120 degrees of right shoulder abduction for improved function with overhead activity. Baseline:  Goal status: MET  PLAN:  PT FREQUENCY: 2x/week  PT DURATION: 6 weeks  PLANNED INTERVENTIONS: Therapeutic exercises, Therapeutic activity, Neuromuscular re-education, Patient/Family education, Self Care, Joint mobilization, Electrical stimulation, Cryotherapy, Moist heat, Vasopneumatic device, Manual therapy, and Re-evaluation  PLAN FOR NEXT SESSION: Provide TENS education   Granville Lewis, PT 07/21/2022, 11:42 AM

## 2022-07-23 ENCOUNTER — Telehealth: Payer: Self-pay

## 2022-07-23 ENCOUNTER — Ambulatory Visit (AMBULATORY_SURGERY_CENTER): Payer: No Typology Code available for payment source

## 2022-07-23 ENCOUNTER — Encounter: Payer: Self-pay | Admitting: Internal Medicine

## 2022-07-23 VITALS — Ht 63.5 in | Wt 212.0 lb

## 2022-07-23 DIAGNOSIS — Z1211 Encounter for screening for malignant neoplasm of colon: Secondary | ICD-10-CM

## 2022-07-23 NOTE — Progress Notes (Signed)
No egg or soy allergy known to patient  No issues known to pt with past sedation with any surgeries or procedures Patient denies ever being told they had issues or difficulty with intubation  No FH of Malignant Hyperthermia Pt is not on diet pills Pt is not on  home 02  Pt is not on blood thinners  Pt with irregular BM pattern. Reports periods of constipation followed by multi BM in a day. No A fib or A flutter Have any cardiac testing pending--no LOA: independent Prep: Spilt dose Miralax   Patient's chart reviewed by Cathlyn Parsons CNRA prior to previsit and patient appropriate for the [facility].  Previsit completed and red dot placed by patient's name on their procedure day (on provider's schedule).     PV complete. Prep instructions sent via mychart and home address. Pt made aware of increased mailing times. Encouraged to log into MyChart and reread her instructions her own and call the office back with any questions.

## 2022-07-23 NOTE — Telephone Encounter (Signed)
Dr. Marina Goodell,   I am completing Kristin Rasmussen Rasmussen. The patient is wanting to know if it would be ok to do Miralax prep. I see your TE where it was advised for her to do movi prep with zofran. Pt states tolerating miralax prep in the past.   Please advise.

## 2022-07-23 NOTE — Telephone Encounter (Signed)
MiraLAX prep okay. Make sure she has Zofran and takes it before each prep session.  Thanks

## 2022-07-30 ENCOUNTER — Ambulatory Visit: Payer: No Typology Code available for payment source | Admitting: Physical Therapy

## 2022-07-30 ENCOUNTER — Encounter: Payer: Self-pay | Admitting: Physical Therapy

## 2022-07-30 DIAGNOSIS — M25511 Pain in right shoulder: Secondary | ICD-10-CM | POA: Diagnosis not present

## 2022-07-30 DIAGNOSIS — M25611 Stiffness of right shoulder, not elsewhere classified: Secondary | ICD-10-CM

## 2022-07-30 NOTE — Therapy (Addendum)
OUTPATIENT PHYSICAL THERAPY SHOULDER TREATMENT   Patient Name: Kristin Rasmussen MRN: [id number] DOB:[birth year], 62 y.o., female Today's Date: 07/30/2022  END OF SESSION:  PT End of Session - 07/30/22 0935     Visit Number 16    Number of Visits 16    Date for PT Re-Evaluation 08/01/22    PT Start Time 0935    PT Stop Time 1016    PT Time Calculation (min) 41 min    Activity Tolerance Patient tolerated treatment well    Behavior During Therapy [facility] for tasks assessed/performed            Past Medical History:  Diagnosis Date   Anemia 2004   HCT 34.7   Anxiety    follows with psyc for same   Back pain    Basal cell cancer    x3; Dr Danella Deis   Bladder leak    Chest pain 06/2010   Costochondritis   Diverticulosis    Gallbladder problem    GERD (gastroesophageal reflux disease)    Hyperlipidemia    IBS (irritable bowel syndrome)    Melanoma (HCC) 2006   x1   PCOS (polycystic ovarian syndrome)    Plantar fasciitis 08/13/2011   Left is significant and RT is mild by [place] criteria    Spasm of esophagus    Swelling    Thrombocytopenia (HCC) 2002   133,000 platelets   Past Surgical History:  Procedure Laterality Date   CATARACT EXTRACTION W/PHACO Right 05/13/2021   Procedure: CATARACT EXTRACTION PHACO AND INTRAOCULAR LENS PLACEMENT (IOC);  Surgeon: Fabio Pierce, MD;  Location: [facility];  Service: Ophthalmology;  Laterality: Right;  CDE: 8.14    CATARACT EXTRACTION W/PHACO Left 05/27/2021   Procedure: CATARACT EXTRACTION PHACO AND INTRAOCULAR LENS PLACEMENT (IOC);  Surgeon: Fabio Pierce, MD;  Location: [facility];  Service: Ophthalmology;  Laterality: Left;  CDE: 6.55   CHOLECYSTECTOMY  2002   CCS   COLONOSCOPY     MASS EXCISION Right 10/22/2021   Procedure: EXCISION MASS, RIGHT FOOT;  Surgeon: Teryl Lucy, MD;  Location: [facility];  Service: Orthopedics;  Laterality: Right;   MELANOMA EXCISION     LUE 2006  CCS; Derm : Dr Campbell Stall   SHOULDER  ACROMIOPLASTY Right 05/13/2022   Procedure: SHOULDER ACROMIOPLASTY;  Surgeon: Teryl Lucy, MD;  Location: [facility];  Service: Orthopedics;  Laterality: Right;   SHOULDER ARTHROSCOPY WITH DISTAL CLAVICLE RESECTION Right 05/13/2022   Procedure: SHOULDER ARTHROSCOPY WITH DISTAL CLAVICLE EXCISION;  Surgeon: Teryl Lucy, MD;  Location: [facility];  Service: Orthopedics;  Laterality: Right;   SHOULDER SURGERY     Left   TONSILLECTOMY     TOTAL ABDOMINAL HYSTERECTOMY  1991   abnormal PAP hx   Patient Active Problem List   Diagnosis Date Noted   Hair loss 12/20/2020   Prediabetes 12/24/2019   Obesity (BMI 30-39.9) 12/20/2019   Overactive bladder 12/09/2018   Menopausal symptom 11/02/2018   Right wrist pain 12/07/2017   Insulin resistance 10/08/2016   Vitamin D deficiency 09/15/2016   Osteopenia 04/19/2015   Chronic lower back pain 09/19/2014   Anxiety    Metatarsalgia 08/13/2011   DIVERTICULOSIS, COLON 01/20/2008   SKIN CANCER, HX OF 01/20/2008   History of melanoma in situ 10/28/2006   HYPERLIPIDEMIA 10/28/2006   GERD 10/28/2006   PCP: Pincus Sanes, MD  REFERRING PROVIDER: Teryl Lucy, MD  REFERRING DIAG: S/P right shoulder scope, acromioplasty, distal clavicle excision, trephination for calcific  tendinitis  THERAPY DIAG:  Acute pain of right shoulder  Stiffness of right shoulder, not elsewhere classified  Rationale for Evaluation and Treatment: Rehabilitation  ONSET DATE: 05/13/22  SUBJECTIVE:                                                                                                                                                                                      SUBJECTIVE STATEMENT: Has been released by MD for shoulder and told to return as needed. Has soreness especially by the end of the day and may take a tylenol.  Hand dominance: Right  PERTINENT HISTORY: Allergies, osteopenia, anxiety, chronic back pain, and history of  melanoma  PAIN:  Are you having pain? Yes: NPRS scale: 1/10 Pain location: right shoulder Pain description: sore Aggravating factors: using her arm Relieving factors: medication and heat  PRECAUTIONS: None  PATIENT GOALS: improved use of her arm, be able to do yard work, be able to shower and dry her hair  NEXT MD VISIT: none  OBJECTIVE:   PATIENT SURVEYS:  FOTO 83  COGNITION: Overall cognitive status: Within functional limits for tasks assessed     SENSATION: Patient reports rare tingling around her right shoulder  POSTURE: Rounded shoulders   UPPER EXTREMITY ROM:   Active ROM Right eval Right 07/03/22 Left eval  Shoulder flexion 94/ 128 (PROM); tight and sore 121 143  Shoulder extension     Shoulder abduction 84/ 76 (PROM) limited by pain 126 134  Shoulder adduction     Shoulder internal rotation   To T4  Shoulder external rotation 25 (PROM at neutral)   To T2  Elbow flexion     Elbow extension     Wrist flexion     Wrist extension     Wrist ulnar deviation     Wrist radial deviation     Wrist pronation     Wrist supination     (Blank rows = not tested)  UPPER EXTREMITY MMT: not tested due to surgical condition  SHOULDER SPECIAL TESTS: Not tested due to surgical condition  JOINT MOBILITY TESTING:  Right AC joint: hypomobile and painful Right glenohumeral: hypomobile and painful  PALPATION:  TTP: right upper trapezius, infraspinatus, supraspinatus, pectoralis minor, and deltoid   TODAY'S TREATMENT:  DATE:  07/30/22 EXERCISE LOG  Exercise Repetitions and Resistance Comments  UBE X10 minutes @ 90 RPM   [place]ley X3 min   Resisted IR  Green t-band x 30 reps    Resisted ER  Green t-band x 20 reps    Resisted row Green theraband x30 reps   Wall pushups X20 rpes   ABD wall taps  Green theraband x15 reps   Wall clock Green  theraband x10 reps   Wall ball into flexion 4# x15 reps        Blank cell = exercise not performed today   PATIENT EDUCATION: Education details: Plan of care, healing, prognosis, HEP, and goals for therapy Person educated: Patient and Spouse Education method: Explanation Education comprehension: verbalized understanding  HOME EXERCISE PROGRAM:  ASSESSMENT:  CLINICAL IMPRESSION: Patient presented in clinic with reports of improvement with R shoulder since surgery. Patient still has some limitation with IR but reports she does not require assist from husband and is no more limited than she was prior to surgery. Patient able to achieve all goals set at evaluation and provided new green theraband for HEP continuation at home as she reports compliance as well. Patient able to tolerate all therex well with only reports of muscle fatigue. Patient understanding of TENS unit and had no other concerns at DC.  PHYSICAL THERAPY DISCHARGE SUMMARY  Visits from Start of Care: 16  Current functional level related to goals / functional outcomes: Patient was able to meet all of her goals for skilled physical therapy.    Remaining deficits: None    Education / Equipment: HEP    Patient agrees to discharge. Patient goals were met. Patient is being discharged due to meeting the stated rehab goals.  Candi Leash, PT, DPT    OBJECTIVE IMPAIRMENTS: decreased activity tolerance, decreased knowledge of condition, decreased ROM, decreased strength, hypomobility, impaired perceived functional ability, impaired tone, impaired UE functional use, and pain.   ACTIVITY LIMITATIONS: carrying, lifting, sleeping, reach over head, and hygiene/grooming  PARTICIPATION LIMITATIONS: meal prep, cleaning, laundry, driving, shopping, community activity, and yard work  PERSONAL FACTORS: 3+ comorbidities: Allergies, osteopenia, anxiety, chronic back pain, and history of melanoma  are also affecting patient's functional  outcome.   REHAB POTENTIAL: Good  CLINICAL DECISION MAKING: Evolving/moderate complexity  EVALUATION COMPLEXITY: Moderate  GOALS: Goals reviewed with patient? Yes  SHORT TERM GOALS: Target date: 06/25/22  Patient will be independent with her initial HEP. Baseline: Goal status: MET  2.  Patient will be able to complete her daily activities without her familiar pain exceeding 8/10. Baseline:  Goal status: MET  3.  Patient will report being able to sleep in her bed without being awakened by her familiar right shoulder pain. Baseline: 5/28: half the night Goal status: MET  4.  Patient will be able to demonstrate at least 110 degrees of right shoulder flexion for improved function reaching overhead. Baseline:  Goal status: MET  5.  Patient will be able to demonstrate at least 110 degrees of right shoulder abduction for improved function reaching. Baseline:  Goal status: MET  LONG TERM GOALS: Target date: 07/16/22  Patient will be independent with her advanced HEP.  Baseline:  Goal status: MET  2.  Patient will be able to complete her daily activities without her familiar pain exceeding 6/10. Baseline:  Goal status: MET  3.  Patient will be able to demonstrate at least 130 degrees of right shoulder flexion for improved function reaching overhead. Baseline: 6/6: 131 degrees  Goal status: MET  4.  Patient will report being able to wash her back with her right upper extremity without needing assistance from her husband. Baseline:  Goal status: MET  5.  Patient will be able to demonstrate at least 120 degrees of right shoulder abduction for improved function with overhead activity. Baseline:  Goal status: MET  PLAN:  PT FREQUENCY: 2x/week  PT DURATION: 6 weeks  PLANNED INTERVENTIONS: Therapeutic exercises, Therapeutic activity, Neuromuscular re-education, Patient/Family education, Self Care, Joint mobilization, Electrical stimulation, Cryotherapy, Moist heat,  Vasopneumatic device, Manual therapy, and Re-evaluation  PLAN FOR NEXT SESSION: DC  Marvell Fuller, PTA 07/30/2022, 10:25 AM

## 2022-08-01 ENCOUNTER — Other Ambulatory Visit: Payer: Self-pay | Admitting: Internal Medicine

## 2022-08-01 ENCOUNTER — Ambulatory Visit (AMBULATORY_SURGERY_CENTER): Payer: PRIVATE HEALTH INSURANCE | Admitting: Internal Medicine

## 2022-08-01 ENCOUNTER — Encounter: Payer: Self-pay | Admitting: Internal Medicine

## 2022-08-01 VITALS — BP 129/76 | HR 84 | Temp 98.4°F | Resp 11 | Ht 63.5 in | Wt 212.0 lb

## 2022-08-01 DIAGNOSIS — K635 Polyp of colon: Secondary | ICD-10-CM | POA: Diagnosis not present

## 2022-08-01 DIAGNOSIS — Z1211 Encounter for screening for malignant neoplasm of colon: Secondary | ICD-10-CM

## 2022-08-01 DIAGNOSIS — D124 Benign neoplasm of descending colon: Secondary | ICD-10-CM | POA: Diagnosis not present

## 2022-08-01 MED ORDER — SODIUM CHLORIDE 0.9 % IV SOLN
500.0000 mL | Freq: Once | INTRAVENOUS | Status: DC
Start: 2022-08-01 — End: 2022-08-01

## 2022-08-01 NOTE — Progress Notes (Signed)
Uneventful anesthetic. Report to pacu rn. Vss. Care resumed by rn. 

## 2022-08-01 NOTE — Op Note (Signed)
[facility] Patient Name: Kristin Rasmussen Procedure Date: 08/01/2022 8:55 AM MRN: [id number] Endoscopist: Wilhemina Bonito. Marina Goodell , MD, [id number] Age: 62 Referring MD:  Date of Birth: [birth year] Gender: Female Account #: [id number] Procedure:                Colonoscopy with cold snare polypectomy x 1 Indications:              Screening for colorectal malignant neoplasm.                            Previous examinations 2002, 2007, 2014 Medicines:                Monitored Anesthesia Care Procedure:                Pre-Anesthesia Assessment:                           - Prior to the procedure, a History and Physical                            was performed, and patient medications and                            allergies were reviewed. The patient's tolerance of                            previous anesthesia was also reviewed. The risks                            and benefits of the procedure and the sedation                            options and risks were discussed with the patient.                            All questions were answered, and informed consent                            was obtained. Prior Anticoagulants: The patient has                            taken no anticoagulant or antiplatelet agents. ASA                            Grade Assessment: II - A patient with mild systemic                            disease. After reviewing the risks and benefits,                            the patient was deemed in satisfactory condition to                            undergo the procedure.  After obtaining informed consent, the colonoscope                            was passed under direct vision. Throughout the                            procedure, the patient's blood pressure, pulse, and                            oxygen saturations were monitored continuously. The                            CF [id number] #[id number] was introduced through the anus                             and advanced to the the cecum, identified by                            appendiceal orifice and ileocecal valve. The                            ileocecal valve, appendiceal orifice, and rectum                            were photographed. The quality of the bowel                            preparation was excellent. The colonoscopy was                            performed without difficulty. The patient tolerated                            the procedure well. The bowel preparation used was                            Miralax via split dose instruction. Scope In: 9:06:13 AM Scope Out: 9:22:51 AM Scope Withdrawal Time: 0 hours 9 minutes 42 seconds  Total Procedure Duration: 0 hours 16 minutes 38 seconds  Findings:                 A 3 mm polyp was found in the descending colon. The                            polyp was sessile. The polyp was removed with a                            cold snare. Resection and retrieval were complete.                           Multiple diverticula were found in the sigmoid                            colon.  The exam was otherwise without abnormality on                            direct and retroflexion views. Complications:            No immediate complications. Estimated blood loss:                            None. Estimated Blood Loss:     Estimated blood loss: none. Impression:               - One 3 mm polyp in the descending colon, removed                            with a cold snare. Resected and retrieved.                           - Diverticulosis in the sigmoid colon.                           - The examination was otherwise normal on direct                            and retroflexion views. Recommendation:           - Repeat colonoscopy in 7-10 years for surveillance.                           - Patient has a contact number available for                            emergencies. The signs and symptoms of potential                             delayed complications were discussed with the                            patient. Return to normal activities tomorrow.                            Written discharge instructions were provided to the                            patient.                           - Resume previous diet.                           - Continue present medications.                           - Await pathology results. Wilhemina Bonito. Marina Goodell, MD 08/01/2022 9:27:57 AM This report has been signed electronically.

## 2022-08-01 NOTE — Progress Notes (Signed)
Called to room to assist during endoscopic procedure.  Patient ID and intended procedure confirmed with present staff. Received instructions for my participation in the procedure from the performing physician.  

## 2022-08-01 NOTE — Progress Notes (Signed)
Pt's states no medical or surgical changes since previsit or office visit. 

## 2022-08-01 NOTE — Progress Notes (Signed)
HISTORY OF PRESENT ILLNESS:  Kristin Rasmussen is a 62 y.o. female who presents today for colonoscopy.  Previous examinations 2002, 2007, 2014 all negative for neoplasia  REVIEW OF SYSTEMS:  All non-GI ROS negative except for  Past Medical History:  Diagnosis Date   Anemia 2004   HCT 34.7   Anxiety    follows with psyc for same   Back pain    Basal cell cancer    x3; Dr Danella Deis   Bladder leak    Chest pain 06/2010   Costochondritis   Diverticulosis    Gallbladder problem    GERD (gastroesophageal reflux disease)    Hyperlipidemia    IBS (irritable bowel syndrome)    Melanoma (HCC) 2006   x1   PCOS (polycystic ovarian syndrome)    Plantar fasciitis 08/13/2011   Left is significant and RT is mild by [place] criteria    Spasm of esophagus    Swelling    Thrombocytopenia (HCC) 2002   133,000 platelets    Past Surgical History:  Procedure Laterality Date   CATARACT EXTRACTION W/PHACO Right 05/13/2021   Procedure: CATARACT EXTRACTION PHACO AND INTRAOCULAR LENS PLACEMENT (IOC);  Surgeon: Fabio Pierce, MD;  Location: [facility];  Service: Ophthalmology;  Laterality: Right;  CDE: 8.14    CATARACT EXTRACTION W/PHACO Left 05/27/2021   Procedure: CATARACT EXTRACTION PHACO AND INTRAOCULAR LENS PLACEMENT (IOC);  Surgeon: Fabio Pierce, MD;  Location: [facility];  Service: Ophthalmology;  Laterality: Left;  CDE: 6.55   CHOLECYSTECTOMY  2002   [facility]   COLONOSCOPY     MASS EXCISION Right 10/22/2021   Procedure: EXCISION MASS, RIGHT FOOT;  Surgeon: Teryl Lucy, MD;  Location: [facility];  Service: Orthopedics;  Laterality: Right;   MELANOMA EXCISION     LUE 2006  [facility]; Derm : Dr Campbell Stall   SHOULDER ACROMIOPLASTY Right 05/13/2022   Procedure: SHOULDER ACROMIOPLASTY;  Surgeon: Teryl Lucy, MD;  Location: [facility];  Service: Orthopedics;  Laterality: Right;   SHOULDER ARTHROSCOPY WITH DISTAL CLAVICLE RESECTION Right 05/13/2022   Procedure: SHOULDER ARTHROSCOPY WITH  DISTAL CLAVICLE EXCISION;  Surgeon: Teryl Lucy, MD;  Location:  SURGERY CENTER;  Service: Orthopedics;  Laterality: Right;   SHOULDER SURGERY     Left   TONSILLECTOMY     TOTAL ABDOMINAL HYSTERECTOMY  1991   abnormal PAP hx    Social History Kristin Rasmussen  reports that she has never smoked. She has never used smokeless tobacco. She reports current alcohol use. She reports that she does not use drugs.  family history includes Colon cancer in her maternal grandfather; Colon polyps in her father and sister; Diabetes in her maternal grandmother and mother; GER disease in her father; Heart attack (age of onset: 39) in her maternal grandfather; Heart attack (age of onset: 34) in her maternal grandmother; Hypertension in her father and mother; Obesity in her mother; Ovarian cancer in her paternal aunt; Skin cancer in her father; Stroke (age of onset: 45) in her maternal grandfather.  Allergies  Allergen Reactions   Tape     Surgical tape gives her blisters       PHYSICAL EXAMINATION: Vital signs: BP 121/83   Pulse 77   Temp 98.4 F (36.9 C)   Resp 11   Ht 5' 3.5" (1.613 m)   Wt 212 lb (96.2 kg)   SpO2 94%   BMI 36.97 kg/m  General: Well-developed, well-nourished, no acute distress HEENT: Sclerae are anicteric, conjunctiva pink. Oral mucosa  intact Lungs: Clear Heart: Regular Abdomen: soft, nontender, nondistended, no obvious ascites, no peritoneal signs, normal bowel sounds. No organomegaly. Extremities: No edema Psychiatric: alert and oriented x3. Cooperative     ASSESSMENT:  Cancer screening   PLAN:   Screening colonoscopy

## 2022-08-01 NOTE — Patient Instructions (Signed)
YOU HAD AN ENDOSCOPIC PROCEDURE TODAY AT THE [facility]:   Refer to the procedure report that was given to you for any specific questions about what was found during the examination.  If the procedure report does not answer your questions, please call your gastroenterologist to clarify.  If you requested that your care partner not be given the details of your procedure findings, then the procedure report has been included in a sealed envelope for you to review at your convenience later.  YOU SHOULD EXPECT: Some feelings of bloating in the abdomen. Passage of more gas than usual.  Walking can help get rid of the air that was put into your GI tract during the procedure and reduce the bloating. If you had a lower endoscopy (such as a colonoscopy or flexible sigmoidoscopy) you may notice spotting of blood in your stool or on the toilet paper. If you underwent a bowel prep for your procedure, you may not have a normal bowel movement for a few days.  Please Note:  You might notice some irritation and congestion in your nose or some drainage.  This is from the oxygen used during your procedure.  There is no need for concern and it should clear up in a day or so.  SYMPTOMS TO REPORT IMMEDIATELY:  Following lower endoscopy (colonoscopy or flexible sigmoidoscopy):  Excessive amounts of blood in the stool  Significant tenderness or worsening of abdominal pains  Swelling of the abdomen that is new, acute  Fever of 100F or higher  For urgent or emergent issues, a gastroenterologist can be reached at any hour by calling [phone]. Do not use MyChart messaging for urgent concerns.    DIET:  We do recommend a small meal at first, but then you may proceed to your regular diet.  Drink plenty of fluids but you should avoid alcoholic beverages for 24 hours.  ACTIVITY:  You should plan to take it easy for the rest of today and you should NOT DRIVE or use heavy machinery until tomorrow (because of  the sedation medicines used during the test).    FOLLOW UP: Our staff will call the number listed on your records the next business day following your procedure.  We will call around 7:15- 8:00 am to check on you and address any questions or concerns that you may have regarding the information given to you following your procedure. If we do not reach you, we will leave a message.     If any biopsies were taken you will be contacted by phone or by letter within the next 1-3 weeks.  Please call us at [phone] if you have not heard about the biopsies in 3 weeks.    SIGNATURES/CONFIDENTIALITY: You and/or your care partner have signed paperwork which will be entered into your electronic medical record.  These signatures attest to the fact that that the information above on your After Visit Summary has been reviewed and is understood.  Full responsibility of the confidentiality of this discharge information lies with you and/or your care-partner.  

## 2022-08-04 ENCOUNTER — Telehealth: Payer: Self-pay | Admitting: *Deleted

## 2022-08-04 NOTE — Telephone Encounter (Signed)
  Follow up Call-     08/01/2022    8:41 AM  Call back number  Post procedure Call Back phone  # [phone]  Permission to leave phone message Yes     Patient questions:  Do you have a fever, pain , or abdominal swelling? No. Pain Score  0 *  Have you tolerated food without any problems? Yes.    Have you been able to return to your normal activities? Yes.    Do you have any questions about your discharge instructions: Diet   No. Medications  No. Follow up visit  No.  Do you have questions or concerns about your Care? No.  Actions: * If pain score is 4 or above: No action needed, pain <4.

## 2022-08-06 ENCOUNTER — Encounter: Payer: Self-pay | Admitting: Internal Medicine

## 2022-09-08 ENCOUNTER — Other Ambulatory Visit: Payer: Self-pay | Admitting: Internal Medicine

## 2022-09-30 ENCOUNTER — Other Ambulatory Visit: Payer: Self-pay | Admitting: Internal Medicine

## 2022-11-25 ENCOUNTER — Telehealth: Payer: Self-pay | Admitting: Internal Medicine

## 2022-11-25 DIAGNOSIS — E559 Vitamin D deficiency, unspecified: Secondary | ICD-10-CM

## 2022-11-25 DIAGNOSIS — E782 Mixed hyperlipidemia: Secondary | ICD-10-CM

## 2022-11-25 DIAGNOSIS — R7303 Prediabetes: Secondary | ICD-10-CM

## 2022-11-25 NOTE — Telephone Encounter (Signed)
Pt has CPE scheduled 11/21 and lab appt scheduled 11/13. Please enter lab orders.

## 2022-11-26 NOTE — Telephone Encounter (Signed)
ordered

## 2022-12-08 ENCOUNTER — Other Ambulatory Visit: Payer: Self-pay | Admitting: Internal Medicine

## 2022-12-10 ENCOUNTER — Other Ambulatory Visit: Payer: Self-pay | Admitting: Adult Health

## 2022-12-10 DIAGNOSIS — F411 Generalized anxiety disorder: Secondary | ICD-10-CM

## 2022-12-10 DIAGNOSIS — G4700 Insomnia, unspecified: Secondary | ICD-10-CM

## 2022-12-17 ENCOUNTER — Other Ambulatory Visit (INDEPENDENT_AMBULATORY_CARE_PROVIDER_SITE_OTHER): Payer: Managed Care, Other (non HMO)

## 2022-12-17 DIAGNOSIS — R7303 Prediabetes: Secondary | ICD-10-CM | POA: Diagnosis not present

## 2022-12-17 DIAGNOSIS — E559 Vitamin D deficiency, unspecified: Secondary | ICD-10-CM

## 2022-12-17 DIAGNOSIS — E782 Mixed hyperlipidemia: Secondary | ICD-10-CM | POA: Diagnosis not present

## 2022-12-17 LAB — COMPREHENSIVE METABOLIC PANEL
ALT: 18 U/L (ref 0–35)
AST: 18 U/L (ref 0–37)
Albumin: 4.1 g/dL (ref 3.5–5.2)
Alkaline Phosphatase: 40 U/L (ref 39–117)
BUN: 8 mg/dL (ref 6–23)
CO2: 31 meq/L (ref 19–32)
Calcium: 9.3 mg/dL (ref 8.4–10.5)
Chloride: 98 meq/L (ref 96–112)
Creatinine, Ser: 0.63 mg/dL (ref 0.40–1.20)
GFR: 95.19 mL/min (ref >60.00)
Glucose, Bld: 97 mg/dL (ref 70–99)
Potassium: 4.3 meq/L (ref 3.5–5.1)
Sodium: 137 meq/L (ref 135–145)
Total Bilirubin: 0.4 mg/dL (ref 0.2–1.2)
Total Protein: 6.8 g/dL (ref 6.0–8.3)

## 2022-12-17 LAB — CBC WITH DIFFERENTIAL/PLATELET
Basophils Absolute: 0.1 10*3/uL (ref 0.0–0.1)
Basophils Relative: 0.8 % (ref 0.0–3.0)
Eosinophils Absolute: 0.1 10*3/uL (ref 0.0–0.7)
Eosinophils Relative: 2.1 % (ref 0.0–5.0)
HCT: 46.5 % — ABNORMAL HIGH (ref 36.0–46.0)
Hemoglobin: 15.6 g/dL — ABNORMAL HIGH (ref 12.0–15.0)
Lymphocytes Relative: 18.9 % (ref 12.0–46.0)
Lymphs Abs: 1.3 10*3/uL (ref 0.7–4.0)
MCHC: 33.5 g/dL (ref 30.0–36.0)
MCV: 90.4 fL (ref 78.0–100.0)
Monocytes Absolute: 0.4 10*3/uL (ref 0.1–1.0)
Monocytes Relative: 5.3 % (ref 3.0–12.0)
Neutro Abs: 5.1 10*3/uL (ref 1.4–7.7)
Neutrophils Relative %: 72.9 % (ref 43.0–77.0)
Platelets: 221 10*3/uL (ref 150.0–400.0)
RBC: 5.14 Mil/uL — ABNORMAL HIGH (ref 3.87–5.11)
RDW: 14.2 % (ref 11.5–15.5)
WBC: 7 10*3/uL (ref 4.0–10.5)

## 2022-12-17 LAB — LIPID PANEL
Cholesterol: 174 mg/dL (ref 0–200)
HDL: 43 mg/dL (ref >39.00)
LDL Cholesterol: 98 mg/dL (ref 0–99)
NonHDL: 131.33
Total CHOL/HDL Ratio: 4
Triglycerides: 166 mg/dL — ABNORMAL HIGH (ref 0.0–149.0)
VLDL: 33.2 mg/dL (ref 0.0–40.0)

## 2022-12-17 LAB — TSH: TSH: 1.63 u[IU]/mL (ref 0.35–5.50)

## 2022-12-17 LAB — VITAMIN D 25 HYDROXY (VIT D DEFICIENCY, FRACTURES): VITD: 30.16 ng/mL (ref 30.00–100.00)

## 2022-12-17 LAB — HEMOGLOBIN A1C: Hgb A1c MFr Bld: 5.7 % (ref 4.6–6.5)

## 2022-12-24 ENCOUNTER — Encounter: Payer: Self-pay | Admitting: Internal Medicine

## 2022-12-24 NOTE — Patient Instructions (Addendum)
Medications changes include :   None     Return in about 1 year (around 12/25/2023) for Physical Exam.    Health Maintenance, Female Adopting a healthy lifestyle and getting preventive care are important in promoting health and wellness. Ask your health care provider about: The right schedule for you to have regular tests and exams. Things you can do on your own to prevent diseases and keep yourself healthy. What should I know about diet, weight, and exercise? Eat a healthy diet  Eat a diet that includes plenty of vegetables, fruits, low-fat dairy products, and lean protein. Do not eat a lot of foods that are high in solid fats, added sugars, or sodium. Maintain a healthy weight Body mass index (BMI) is used to identify weight problems. It estimates body fat based on height and weight. Your health care provider can help determine your BMI and help you achieve or maintain a healthy weight. Get regular exercise Get regular exercise. This is one of the most important things you can do for your health. Most adults should: Exercise for at least 150 minutes each week. The exercise should increase your heart rate and make you sweat (moderate-intensity exercise). Do strengthening exercises at least twice a week. This is in addition to the moderate-intensity exercise. Spend less time sitting. Even light physical activity can be beneficial. Watch cholesterol and blood lipids Have your blood tested for lipids and cholesterol at 62 years of age, then have this test every 5 years. Have your cholesterol levels checked more often if: Your lipid or cholesterol levels are high. You are older than 62 years of age. You are at high risk for heart disease. What should I know about cancer screening? Depending on your health history and family history, you may need to have cancer screening at various ages. This may include screening for: Breast cancer. Cervical cancer. Colorectal cancer. Skin  cancer. Lung cancer. What should I know about heart disease, diabetes, and high blood pressure? Blood pressure and heart disease High blood pressure causes heart disease and increases the risk of stroke. This is more likely to develop in people who have high blood pressure readings or are overweight. Have your blood pressure checked: Every 3-5 years if you are 78-60 years of age. Every year if you are 85 years old or older. Diabetes Have regular diabetes screenings. This checks your fasting blood sugar level. Have the screening done: Once every three years after age 64 if you are at a normal weight and have a low risk for diabetes. More often and at a younger age if you are overweight or have a high risk for diabetes. What should I know about preventing infection? Hepatitis B If you have a higher risk for hepatitis B, you should be screened for this virus. Talk with your health care provider to find out if you are at risk for hepatitis B infection. Hepatitis C Testing is recommended for: Everyone born from [birth year] through 1965. Anyone with known risk factors for hepatitis C. Sexually transmitted infections (STIs) Get screened for STIs, including gonorrhea and chlamydia, if: You are sexually active and are younger than 62 years of age. You are older than 62 years of age and your health care provider tells you that you are at risk for this type of infection. Your sexual activity has changed since you were last screened, and you are at increased risk for chlamydia or gonorrhea. Ask your health care provider if you are at  risk. Ask your health care provider about whether you are at high risk for HIV. Your health care provider may recommend a prescription medicine to help prevent HIV infection. If you choose to take medicine to prevent HIV, you should first get tested for HIV. You should then be tested every 3 months for as long as you are taking the medicine. Pregnancy If you are about to stop  having your period (premenopausal) and you may become pregnant, seek counseling before you get pregnant. Take 400 to 800 micrograms (mcg) of folic acid every day if you become pregnant. Ask for birth control (contraception) if you want to prevent pregnancy. Osteoporosis and menopause Osteoporosis is a disease in which the bones lose minerals and strength with aging. This can result in bone fractures. If you are 15 years old or older, or if you are at risk for osteoporosis and fractures, ask your health care provider if you should: Be screened for bone loss. Take a calcium or vitamin D supplement to lower your risk of fractures. Be given hormone replacement therapy (HRT) to treat symptoms of menopause. Follow these instructions at home: Alcohol use Do not drink alcohol if: Your health care provider tells you not to drink. You are pregnant, may be pregnant, or are planning to become pregnant. If you drink alcohol: Limit how much you have to: 0-1 drink a day. Know how much alcohol is in your drink. In the [place] one drink equals one 12 oz bottle of beer (355 mL), one 5 oz glass of wine (148 mL), or one 1 oz glass of hard liquor (44 mL). Lifestyle Do not use any products that contain nicotine or tobacco. These products include cigarettes, chewing tobacco, and vaping devices, such as e-cigarettes. If you need help quitting, ask your health care provider. Do not use street drugs. Do not share needles. Ask your health care provider for help if you need support or information about quitting drugs. General instructions Schedule regular health, dental, and eye exams. Stay current with your vaccines. Tell your health care provider if: You often feel depressed. You have ever been abused or do not feel safe at home. Summary Adopting a healthy lifestyle and getting preventive care are important in promoting health and wellness. Follow your health care provider's instructions about healthy diet,  exercising, and getting tested or screened for diseases. Follow your health care provider's instructions on monitoring your cholesterol and blood pressure. This information is not intended to replace advice given to you by your health care provider. Make sure you discuss any questions you have with your health care provider. Document Revised: 06/11/2020 Document Reviewed: 06/11/2020 Elsevier Patient Education  2024 [employer].

## 2022-12-24 NOTE — Progress Notes (Unsigned)
Subjective:    Patient ID: Kristin Rasmussen, female    DOB: 07-31-1960, 61 y.o.   MRN: [id number]      HPI Kristin Rasmussen is here for a Physical exam and her chronic medical problems.   No changes in health.    Had labs done.    Concerned about weight.    Medications and allergies reviewed with patient and updated if appropriate.  Current Outpatient Medications on File Prior to Visit  Medication Sig Dispense Refill   ALPRAZolam (XANAX) 0.25 MG tablet TAKE 1 TABLET BY MOUTH THREE TIMES DAILY AS NEEDED FOR ANXIETY 90 tablet 4   baclofen (LIORESAL) 10 MG tablet TAKE 1 TABLET BY MOUTH THREE TIMES DAILY 120 tablet 0   calcium citrate-vitamin D (CITRACAL+D) 315-200 MG-UNIT tablet Take 1 tablet by mouth 2 (two) times daily.     cetirizine (ZYRTEC) 10 MG tablet Take 10 mg by mouth daily.     Cholecalciferol (VITAMIN D3) 50 MCG (2000 UT) capsule Take 1,000 Units by mouth daily.     docusate sodium (COLACE) 100 MG capsule Take 100 mg by mouth daily as needed for mild constipation.     DOTTI 0.1 MG/24HR patch Place 1 patch onto the skin 2 (two) times a week.     escitalopram (LEXAPRO) 20 MG tablet Take 1 tablet (20 mg total) by mouth daily. 90 tablet 3   estradiol (ESTRACE) 1 MG tablet Take 1 tablet every day by oral route.     Magnesium 400 MG TABS Take by mouth daily.     oxybutynin (DITROPAN XL) 15 MG 24 hr tablet Take 15 mg by mouth daily.     Prenatal Vit-Fe Fumarate-FA (PRENATAL VITAMIN PO) Take by mouth daily.     valACYclovir (VALTREX) 500 MG tablet Take 500 mg by mouth 2 (two) times daily as needed.     No current facility-administered medications on file prior to visit.    Review of Systems  Constitutional:  Negative for fever.  Eyes:  Negative for visual disturbance.  Respiratory:  Negative for cough, shortness of breath and wheezing.   Cardiovascular:  Negative for chest pain, palpitations and leg swelling.  Gastrointestinal:  Negative for abdominal pain, blood in stool,  constipation and diarrhea.       No gerd  Genitourinary:  Negative for dysuria.  Musculoskeletal:  Positive for arthralgias (right shoulder) and back pain (chronic).  Skin:  Negative for rash.  Neurological:  Negative for light-headedness and headaches.  Psychiatric/Behavioral:  Negative for dysphoric mood. The patient is nervous/anxious.        Objective:   Vitals:   12/25/22 0947  BP: 128/70  Pulse: 72  Temp: 98 F (36.7 C)  SpO2: 98%   Filed Weights   12/25/22 0947  Weight: 212 lb (96.2 kg)   Body mass index is 36.97 kg/m.  BP Readings from Last 3 Encounters:  12/25/22 128/70  08/01/22 129/76  05/13/22 113/65    Wt Readings from Last 3 Encounters:  12/25/22 212 lb (96.2 kg)  08/01/22 212 lb (96.2 kg)  07/23/22 212 lb (96.2 kg)       Physical Exam Constitutional: She appears well-developed and well-nourished. No distress.  HENT:  Head: Normocephalic and atraumatic.  Right Ear: External ear normal. Normal ear canal and TM Left Ear: External ear normal.  Normal ear canal and TM Mouth/Throat: Oropharynx is clear and moist.  Eyes: Conjunctivae normal.  Neck: Neck supple. No tracheal deviation present. No thyromegaly present.  No carotid  bruit  Cardiovascular: Normal rate, regular rhythm and normal heart sounds.   No murmur heard.  No edema. Pulmonary/Chest: Effort normal and breath sounds normal. No respiratory distress. She has no wheezes. She has no rales.  Breast: deferred   Abdominal: Soft. She exhibits no distension. There is no tenderness.  Lymphadenopathy: She has no cervical adenopathy.  Skin: Skin is warm and dry. She is not diaphoretic.  Psychiatric: She has a normal mood and affect. Her behavior is normal.     Lab Results  Component Value Date   WBC 7.0 12/17/2022   HGB 15.6 (H) 12/17/2022   HCT 46.5 (H) 12/17/2022   PLT 221.0 12/17/2022   GLUCOSE 97 12/17/2022   CHOL 174 12/17/2022   TRIG 166.0 (H) 12/17/2022   HDL 43.00 12/17/2022    LDLDIRECT 118.0 12/17/2021   LDLCALC 98 12/17/2022   ALT 18 12/17/2022   AST 18 12/17/2022   NA 137 12/17/2022   K 4.3 12/17/2022   CL 98 12/17/2022   CREATININE 0.63 12/17/2022   BUN 8 12/17/2022   CO2 31 12/17/2022   TSH 1.63 12/17/2022   INR 1.00 06/26/2010   HGBA1C 5.7 12/17/2022         Assessment & Plan:   Physical exam: Screening blood work  ordered Exercise  walking - has been doing 30-40 minutes 4 days a week - this past week only 3 times a week Weight  obese Substance abuse  none   Reviewed recommended immunizations.   Health Maintenance  Topic Date Due   COVID-19 Vaccine (3 - Moderna risk series) 01/10/2023 (Originally 05/11/2019)   DEXA SCAN  12/09/2023   MAMMOGRAM  12/17/2023   DTaP/Tdap/Td (4 - Td or Tdap) 12/20/2029   Colonoscopy  07/31/2032   INFLUENZA VACCINE  Completed   Hepatitis C Screening  Completed   HIV Screening  Completed   Zoster Vaccines- Shingrix  Completed   HPV VACCINES  Aged Out          See Problem List for Assessment and Plan of chronic medical problems.

## 2022-12-25 ENCOUNTER — Ambulatory Visit (INDEPENDENT_AMBULATORY_CARE_PROVIDER_SITE_OTHER): Payer: Managed Care, Other (non HMO) | Admitting: Internal Medicine

## 2022-12-25 VITALS — BP 128/70 | HR 72 | Temp 98.0°F | Ht 63.5 in | Wt 212.0 lb

## 2022-12-25 DIAGNOSIS — R7303 Prediabetes: Secondary | ICD-10-CM

## 2022-12-25 DIAGNOSIS — F419 Anxiety disorder, unspecified: Secondary | ICD-10-CM

## 2022-12-25 DIAGNOSIS — K219 Gastro-esophageal reflux disease without esophagitis: Secondary | ICD-10-CM

## 2022-12-25 DIAGNOSIS — E782 Mixed hyperlipidemia: Secondary | ICD-10-CM

## 2022-12-25 DIAGNOSIS — Z0000 Encounter for general adult medical examination without abnormal findings: Secondary | ICD-10-CM | POA: Diagnosis not present

## 2022-12-25 DIAGNOSIS — E559 Vitamin D deficiency, unspecified: Secondary | ICD-10-CM

## 2022-12-25 DIAGNOSIS — M545 Low back pain, unspecified: Secondary | ICD-10-CM

## 2022-12-25 DIAGNOSIS — E669 Obesity, unspecified: Secondary | ICD-10-CM

## 2022-12-25 DIAGNOSIS — G8929 Other chronic pain: Secondary | ICD-10-CM

## 2022-12-25 MED ORDER — PRAVASTATIN SODIUM 20 MG PO TABS: 20.0000 mg | ORAL_TABLET | Freq: Every day | ORAL | 2 refills | Status: DC

## 2022-12-25 MED ORDER — ESOMEPRAZOLE MAGNESIUM 40 MG PO CPDR: DELAYED_RELEASE_CAPSULE | ORAL | 2 refills | Status: DC

## 2022-12-25 NOTE — Assessment & Plan Note (Signed)
Chronic Regular exercise and healthy diet encouraged Labs reviewed with her Continue pravastatin 20 mg daily

## 2022-12-25 NOTE — Assessment & Plan Note (Addendum)
Chronic Management per psychiatry On alprazolam as needed, Lexapro 20 mg daily Well-controlled per patient

## 2022-12-25 NOTE — Assessment & Plan Note (Signed)
Chronic Has some lower back pain daily, pain b/l hips and upper legs In past has done PT Taking baclofen as needed and it helps some-continue

## 2022-12-25 NOTE — Assessment & Plan Note (Signed)
Chronic Discussed the importance of weight loss and she is very interested in losing weight She has been walking-Stressed regular exercise - goal 30 minutes 5 days a week Discussed that she could consider trying chair yoga Discussed decreasing portions, decreasing sugars/carbs Increase veges, lean protein, fiber in diet Discussed that we could consider adding metformin-would not recommend any other medications at this time

## 2022-12-25 NOTE — Assessment & Plan Note (Addendum)
Chronic Lab Results  Component Value Date   HGBA1C 5.7 12/17/2022  Sugars improved Low sugar / carb diet Stressed regular exercise

## 2022-12-25 NOTE — Assessment & Plan Note (Addendum)
Chronic GERD controlled Continue Nexium 40 mg daily-not able to skip a day because she is symptomatic Hopefully with weight loss we will be able to decrease her dose

## 2022-12-25 NOTE — Assessment & Plan Note (Signed)
Chronic Taking vitamin D daily Vitamin d level is good

## 2023-01-08 ENCOUNTER — Encounter: Payer: Self-pay | Admitting: Internal Medicine

## 2023-01-08 DIAGNOSIS — B009 Herpesviral infection, unspecified: Secondary | ICD-10-CM | POA: Insufficient documentation

## 2023-07-08 ENCOUNTER — Other Ambulatory Visit: Payer: Self-pay | Admitting: Adult Health

## 2023-07-08 DIAGNOSIS — G4700 Insomnia, unspecified: Secondary | ICD-10-CM

## 2023-07-08 DIAGNOSIS — F411 Generalized anxiety disorder: Secondary | ICD-10-CM

## 2023-07-20 ENCOUNTER — Ambulatory Visit: Payer: No Typology Code available for payment source | Admitting: Adult Health

## 2023-07-20 ENCOUNTER — Encounter: Payer: Self-pay | Admitting: Adult Health

## 2023-07-20 DIAGNOSIS — F411 Generalized anxiety disorder: Secondary | ICD-10-CM

## 2023-07-20 DIAGNOSIS — F331 Major depressive disorder, recurrent, moderate: Secondary | ICD-10-CM | POA: Diagnosis not present

## 2023-07-20 DIAGNOSIS — G4700 Insomnia, unspecified: Secondary | ICD-10-CM | POA: Diagnosis not present

## 2023-07-20 MED ORDER — ALPRAZOLAM 0.25 MG PO TABS: 0.2500 mg | ORAL_TABLET | Freq: Three times a day (TID) | ORAL | 2 refills | Status: DC | PRN

## 2023-07-20 MED ORDER — ESCITALOPRAM OXALATE 20 MG PO TABS: 20.0000 mg | ORAL_TABLET | Freq: Every day | ORAL | 3 refills | Status: AC

## 2023-07-20 NOTE — Progress Notes (Signed)
 Kristin Rasmussen [id number] [birth year] 63 y.o.  Subjective:   Patient ID:  Kristin Rasmussen is a 63 y.o. (DOB 1960-03-11) female.  Chief Complaint: No chief complaint on file.   HPI Kristin Rasmussen presents to the office today for follow-up of MDD, GAD, and insomnia.  Describes mood today as ok. Pleasant. Mood symptoms - denies depression - reports situational stressors within family. Reports stable interest and motivation. Denies recent anxiety. Denies irritability. Reports some worry, rumination and over thinking. Reports mood is stable. Stating I feel like I'm doing alright. Feels like medications are helpful. Taking medications as prescribed.  Energy levels stable. Active, has started walking more - eliptical . Working in the yard. Enjoys some usual interests and activities. Married. Lives with husband. Spending time with family.  Appetite adequate. Reports weight gain - 15 to 20 pounds since she retired almost 5 years ago. Sleeps well most nights. Averages 6 to 7 hours a night. Focus and concentration stable. Completing tasks. Managing aspects of household. Retired. Denies SI or HI.  Denies AH or VH. Denies self harm. Denies substance use.    GAD-7    Flowsheet Row Office Visit from 12/20/2020 in [facility]  Total GAD-7 Score 3   PHQ2-9    Flowsheet Row Office Visit from 12/25/2022 in [facility] Office Visit from 12/23/2021 in [facility] Office Visit from 12/20/2020 in [facility] Office Visit from 12/20/2019 in [facility] Office Visit from 12/07/2017 in [facility]  PHQ-2 Total Score 0 1 1 1  0  PHQ-9 Total Score 3 4 1 1  0   Flowsheet Row Admission (Discharged) from 05/13/2022 in [facility] Admission (Discharged) from 05/27/2021 in [facility] Pre-Admission Testing  45 from 05/20/2021 in [facility]  C-SSRS RISK CATEGORY No Risk No Risk No Risk     Review of Systems:  Review of Systems  Musculoskeletal:  Negative for gait problem.  Neurological:  Negative for tremors.  Psychiatric/Behavioral:         Please refer to HPI    Medications: I have reviewed the patient's current medications.  Current Outpatient Medications  Medication Sig Dispense Refill   ALPRAZolam  (XANAX ) 0.25 MG tablet Take 1 tablet (0.25 mg total) by mouth 3 (three) times daily as needed. for anxiety 90 tablet 2   baclofen  (LIORESAL ) 10 MG tablet TAKE 1 TABLET BY MOUTH THREE TIMES DAILY 120 tablet 0   calcium citrate-vitamin D  (CITRACAL+D) 315-200 MG-UNIT tablet Take 1 tablet by mouth 2 (two) times daily.     cetirizine (ZYRTEC) 10 MG tablet Take 10 mg by mouth daily.     Cholecalciferol (VITAMIN D3) 50 MCG (2000 UT) capsule Take 1,000 Units by mouth daily.     docusate sodium (COLACE) 100 MG capsule Take 100 mg by mouth daily as needed for mild constipation.     DOTTI 0.1 MG/24HR patch Place 1 patch onto the skin 2 (two) times a week.     escitalopram  (LEXAPRO ) 20 MG tablet Take 1 tablet (20 mg total) by mouth daily. 90 tablet 3   esomeprazole  (NEXIUM ) 40 MG capsule TAKE 1 CAPSULE BY MOUTH ONCE DAILY 90 capsule 2   estradiol (ESTRACE) 1 MG tablet Take 1 tablet every day by oral route.     Magnesium  400 MG TABS Take by mouth daily.     oxybutynin (  DITROPAN XL) 15 MG 24 hr tablet Take 15 mg by mouth daily.     pravastatin  (PRAVACHOL ) 20 MG tablet Take 1 tablet (20 mg total) by mouth daily. 90 tablet 2   Prenatal Vit-Fe Fumarate-FA (PRENATAL VITAMIN PO) Take by mouth daily.     valACYclovir (VALTREX) 500 MG tablet Take 500 mg by mouth 2 (two) times daily as needed.     No current facility-administered medications for this visit.    Medication Side Effects: None  Allergies:  Allergies  Allergen Reactions   Tape     Surgical tape gives her blisters     Past Medical History:  Diagnosis Date   Anemia 2004   HCT 34.7   Anxiety    follows with psyc for same   Back pain    Basal cell cancer    x3; Dr Alanda Allegra   Bladder leak    Chest pain 06/2010   Costochondritis   Diverticulosis    Gallbladder problem    GERD (gastroesophageal reflux disease)    Hyperlipidemia    IBS (irritable bowel syndrome)    Melanoma (HCC) 2006   x1   PCOS (polycystic ovarian syndrome)    Plantar fasciitis 08/13/2011   Left is significant and RT is mild by US  criteria    Spasm of esophagus    Swelling    Thrombocytopenia (HCC) 2002   133,000 platelets    Past Medical History, Surgical history, Social history, and Family history were reviewed and updated as appropriate.   Please see review of systems for further details on the patient's review from today.   Objective:   Physical Exam:  There were no vitals taken for this visit.  Physical Exam Constitutional:      General: She is not in acute distress.  Musculoskeletal:        General: No deformity.   Neurological:     Mental Status: She is alert and oriented to person, place, and time.     Coordination: Coordination normal.   Psychiatric:        Attention and Perception: Attention and perception normal. She does not perceive auditory or visual hallucinations.        Mood and Affect: Mood normal. Mood is not anxious or depressed. Affect is not labile, blunt, angry or inappropriate.        Speech: Speech normal.        Behavior: Behavior normal.        Thought Content: Thought content normal. Thought content is not paranoid or delusional. Thought content does not include homicidal or suicidal ideation. Thought content does not include homicidal or suicidal plan.        Cognition and Memory: Cognition and memory normal.        Judgment: Judgment normal.     Comments: Insight intact     Lab Review:     Component Value Date/Time   NA 137 12/17/2022 0901   NA 140 06/02/2017 1124   K  4.3 12/17/2022 0901   CL 98 12/17/2022 0901   CO2 31 12/17/2022 0901   GLUCOSE 97 12/17/2022 0901   BUN 8 12/17/2022 0901   BUN 13 06/02/2017 1124   CREATININE 0.63 12/17/2022 0901   CALCIUM 9.3 12/17/2022 0901   PROT 6.8 12/17/2022 0901   PROT 6.6 06/02/2017 1124   ALBUMIN 4.1 12/17/2022 0901   ALBUMIN 4.4 06/02/2017 1124   AST 18 12/17/2022 0901   ALT 18 12/17/2022 0901   ALKPHOS 40 12/17/2022 0901  BILITOT 0.4 12/17/2022 0901   BILITOT 0.2 06/02/2017 1124   GFRNONAA 109 06/02/2017 1124   GFRAA 125 06/02/2017 1124       Component Value Date/Time   WBC 7.0 12/17/2022 0901   RBC 5.14 (H) 12/17/2022 0901   HGB 15.6 (H) 12/17/2022 0901   HGB 14.6 06/03/2016 1253   HGB 14.2 03/29/2010 1506   HCT 46.5 (H) 12/17/2022 0901   HCT 43.3 06/03/2016 1253   HCT 41.3 03/29/2010 1506   PLT 221.0 12/17/2022 0901   PLT 221 03/29/2010 1506   MCV 90.4 12/17/2022 0901   MCV 88 06/03/2016 1253   MCV 88.4 03/29/2010 1506   MCH 29.6 06/03/2016 1253   MCH 29.8 06/28/2010 0355   MCHC 33.5 12/17/2022 0901   RDW 14.2 12/17/2022 0901   RDW 14.5 06/03/2016 1253   RDW 13.6 03/29/2010 1506   LYMPHSABS 1.3 12/17/2022 0901   LYMPHSABS 1.5 06/03/2016 1253   LYMPHSABS 1.6 03/29/2010 1506   MONOABS 0.4 12/17/2022 0901   MONOABS 0.4 03/29/2010 1506   EOSABS 0.1 12/17/2022 0901   EOSABS 0.2 06/03/2016 1253   BASOSABS 0.1 12/17/2022 0901   BASOSABS 0.0 06/03/2016 1253   BASOSABS 0.1 03/29/2010 1506    No results found for: POCLITH, LITHIUM   No results found for: PHENYTOIN, PHENOBARB, VALPROATE, CBMZ   .res Assessment: Plan:    Plan:  1. Lexapro  20mg  daily 2. Xanax  0.25mg  TID  RTC 1 year  Patient advised to contact office with any questions, adverse effects, or acute worsening in signs and symptoms.  Discussed potential benefits, risk, and side effects of benzodiazepines to include potential risk of tolerance and dependence, as well as possible drowsiness. Advised patient  not to drive if experiencing drowsiness and to take lowest possible effective dose to minimize risk of dependence and tolerance. Diagnoses and all orders for this visit:  Major depressive disorder, recurrent episode, moderate (HCC) -     escitalopram  (LEXAPRO ) 20 MG tablet; Take 1 tablet (20 mg total) by mouth daily.  Generalized anxiety disorder -     escitalopram  (LEXAPRO ) 20 MG tablet; Take 1 tablet (20 mg total) by mouth daily. -     ALPRAZolam  (XANAX ) 0.25 MG tablet; Take 1 tablet (0.25 mg total) by mouth 3 (three) times daily as needed. for anxiety  Insomnia, unspecified type -     ALPRAZolam  (XANAX ) 0.25 MG tablet; Take 1 tablet (0.25 mg total) by mouth 3 (three) times daily as needed. for anxiety     Please see After Visit Summary for patient specific instructions.  Future Appointments  Date Time Provider Department Center  12/25/2023  9:10 AM Colene Dauphin, MD [facility] None    No orders of the defined types were placed in this encounter.   -------------------------------

## 2023-09-04 ENCOUNTER — Other Ambulatory Visit: Payer: Self-pay | Admitting: Internal Medicine

## 2023-09-30 ENCOUNTER — Telehealth: Payer: Self-pay | Admitting: Internal Medicine

## 2023-09-30 NOTE — Telephone Encounter (Signed)
 Patient would like to schedule labs for a week prior to her physical on 12/25/23. Please cal patient at [phone].

## 2023-10-30 ENCOUNTER — Telehealth: Payer: Self-pay | Admitting: Internal Medicine

## 2023-10-30 DIAGNOSIS — R7303 Prediabetes: Secondary | ICD-10-CM

## 2023-10-30 DIAGNOSIS — E782 Mixed hyperlipidemia: Secondary | ICD-10-CM

## 2023-10-30 DIAGNOSIS — M85852 Other specified disorders of bone density and structure, left thigh: Secondary | ICD-10-CM

## 2023-10-30 DIAGNOSIS — E559 Vitamin D deficiency, unspecified: Secondary | ICD-10-CM

## 2023-10-30 NOTE — Telephone Encounter (Signed)
 Pt has CPE scheduled 11/21 and has scheduled a lab visit for 11/10.  Please enter lab orders for this visit.

## 2023-10-31 NOTE — Telephone Encounter (Signed)
 Blood work ordered.

## 2023-12-05 ENCOUNTER — Other Ambulatory Visit: Payer: Self-pay | Admitting: Internal Medicine

## 2023-12-12 ENCOUNTER — Other Ambulatory Visit: Payer: Self-pay | Admitting: Adult Health

## 2023-12-12 DIAGNOSIS — G4700 Insomnia, unspecified: Secondary | ICD-10-CM

## 2023-12-12 DIAGNOSIS — F411 Generalized anxiety disorder: Secondary | ICD-10-CM

## 2023-12-14 ENCOUNTER — Other Ambulatory Visit (INDEPENDENT_AMBULATORY_CARE_PROVIDER_SITE_OTHER)

## 2023-12-14 DIAGNOSIS — E559 Vitamin D deficiency, unspecified: Secondary | ICD-10-CM | POA: Diagnosis not present

## 2023-12-14 DIAGNOSIS — R7303 Prediabetes: Secondary | ICD-10-CM

## 2023-12-14 DIAGNOSIS — M85852 Other specified disorders of bone density and structure, left thigh: Secondary | ICD-10-CM

## 2023-12-14 DIAGNOSIS — E782 Mixed hyperlipidemia: Secondary | ICD-10-CM | POA: Diagnosis not present

## 2023-12-14 LAB — CBC
HCT: 42.3 % (ref 36.0–46.0)
Hemoglobin: 14.6 g/dL (ref 12.0–15.0)
MCHC: 34.4 g/dL (ref 30.0–36.0)
MCV: 89 fl (ref 78.0–100.0)
Platelets: 213 K/uL (ref 150.0–400.0)
RBC: 4.75 Mil/uL (ref 3.87–5.11)
RDW: 14 % (ref 11.5–15.5)
WBC: 6.7 K/uL (ref 4.0–10.5)

## 2023-12-14 LAB — VITAMIN D 25 HYDROXY (VIT D DEFICIENCY, FRACTURES): VITD: 33.34 ng/mL (ref 30.00–100.00)

## 2023-12-14 LAB — COMPREHENSIVE METABOLIC PANEL WITH GFR
ALT: 18 U/L (ref 0–35)
AST: 16 U/L (ref 0–37)
Albumin: 4 g/dL (ref 3.5–5.2)
Alkaline Phosphatase: 38 U/L — ABNORMAL LOW (ref 39–117)
BUN: 9 mg/dL (ref 6–23)
CO2: 31 meq/L (ref 19–32)
Calcium: 8.8 mg/dL (ref 8.4–10.5)
Chloride: 100 meq/L (ref 96–112)
Creatinine, Ser: 0.57 mg/dL (ref 0.40–1.20)
GFR: 96.84 mL/min (ref >60.00)
Glucose, Bld: 95 mg/dL (ref 70–99)
Potassium: 4.4 meq/L (ref 3.5–5.1)
Sodium: 137 meq/L (ref 135–145)
Total Bilirubin: 0.4 mg/dL (ref 0.2–1.2)
Total Protein: 6.5 g/dL (ref 6.0–8.3)

## 2023-12-14 LAB — LIPID PANEL
Cholesterol: 177 mg/dL (ref 0–200)
HDL: 44.5 mg/dL (ref >39.00)
LDL Cholesterol: 94 mg/dL (ref 0–99)
NonHDL: 132.96
Total CHOL/HDL Ratio: 4
Triglycerides: 195 mg/dL — ABNORMAL HIGH (ref 0.0–149.0)
VLDL: 39 mg/dL (ref 0.0–40.0)

## 2023-12-14 LAB — TSH: TSH: 1.62 u[IU]/mL (ref 0.35–5.50)

## 2023-12-14 LAB — HEMOGLOBIN A1C: Hgb A1c MFr Bld: 5.5 % (ref 4.6–6.5)

## 2023-12-15 ENCOUNTER — Other Ambulatory Visit

## 2023-12-24 ENCOUNTER — Encounter: Payer: Self-pay | Admitting: Internal Medicine

## 2023-12-24 NOTE — Progress Notes (Signed)
 Subjective:    Patient ID: Kristin Rasmussen, female    DOB: [birth year], 63 y.o.   MRN: [id number]      HPI Shifa is here for a Physical exam and her chronic medical problems.   Had blood work done  Some increased family stresses.     Medications and allergies reviewed with patient and updated if appropriate.  Current Outpatient Medications on File Prior to Visit  Medication Sig Dispense Refill   ALPRAZolam  (XANAX ) 0.25 MG tablet TAKE 1 TABLET BY MOUTH THREE TIMES DAILY AS NEEDED FOR ANXIETY 90 tablet 5   calcium citrate-vitamin D  (CITRACAL+D) 315-200 MG-UNIT tablet Take 1 tablet by mouth 2 (two) times daily.     cetirizine (ZYRTEC) 10 MG tablet Take 10 mg by mouth daily.     Cholecalciferol (VITAMIN D3) 50 MCG (2000 UT) capsule Take 1,000 Units by mouth daily.     docusate sodium (COLACE) 100 MG capsule Take 100 mg by mouth daily as needed for mild constipation.     escitalopram  (LEXAPRO ) 20 MG tablet Take 1 tablet (20 mg total) by mouth daily. 90 tablet 3   estradiol (ESTRACE) 1 MG tablet Take 1 tablet every day by oral route.     Magnesium  400 MG TABS Take by mouth daily.     oxybutynin (DITROPAN XL) 15 MG 24 hr tablet Take 15 mg by mouth daily.     Prenatal Vit-Fe Fumarate-FA (PRENATAL VITAMIN PO) Take by mouth daily.     No current facility-administered medications on file prior to visit.    Review of Systems  Constitutional:  Negative for fever.  Eyes:  Negative for visual disturbance.  Respiratory:  Negative for cough, shortness of breath and wheezing.   Cardiovascular:  Negative for chest pain, palpitations and leg swelling.  Gastrointestinal:  Negative for abdominal pain, blood in stool, constipation and diarrhea.       No gerd  Genitourinary:  Negative for dysuria.  Musculoskeletal:  Positive for back pain (occ spasms after certain activities). Negative for arthralgias.  Skin:  Negative for rash.  Neurological:  Negative for light-headedness and headaches.   Psychiatric/Behavioral:  Positive for dysphoric mood (situational). The patient is nervous/anxious.        Objective:   Vitals:   12/25/23 0921  BP: 130/82  Pulse: 72  Temp: 98 F (36.7 C)  SpO2: 98%   Filed Weights   12/25/23 0921  Weight: 207 lb (93.9 kg)   Body mass index is 36.09 kg/m.  BP Readings from Last 3 Encounters:  12/25/23 130/82  12/25/22 128/70  08/01/22 129/76    Wt Readings from Last 3 Encounters:  12/25/23 207 lb (93.9 kg)  12/25/22 212 lb (96.2 kg)  08/01/22 212 lb (96.2 kg)       Physical Exam Constitutional: She appears well-developed and well-nourished. No distress.  HENT:  Head: Normocephalic and atraumatic.  Right Ear: External ear normal. Normal ear canal and TM Left Ear: External ear normal.  Normal ear canal and TM Mouth/Throat: Oropharynx is clear and moist.  Eyes: Conjunctivae normal.  Neck: Neck supple. No tracheal deviation present. No thyromegaly present.  No carotid bruit  Cardiovascular: Normal rate, regular rhythm and normal heart sounds.   No murmur heard.  No edema. Pulmonary/Chest: Effort normal and breath sounds normal. No respiratory distress. She has no wheezes. She has no rales.  Breast: deferred   Abdominal: Soft. She exhibits no distension. There is no tenderness.  Lymphadenopathy: She has no cervical adenopathy.  Skin: Skin is warm and dry. She is not diaphoretic.  Psychiatric: She has a normal mood and affect. Her behavior is normal.     Lab Results  Component Value Date   WBC 6.7 12/14/2023   HGB 14.6 12/14/2023   HCT 42.3 12/14/2023   PLT 213.0 12/14/2023   GLUCOSE 95 12/14/2023   CHOL 177 12/14/2023   TRIG 195.0 (H) 12/14/2023   HDL 44.50 12/14/2023   LDLDIRECT 118.0 12/17/2021   LDLCALC 94 12/14/2023   ALT 18 12/14/2023   AST 16 12/14/2023   NA 137 12/14/2023   K 4.4 12/14/2023   CL 100 12/14/2023   CREATININE 0.57 12/14/2023   BUN 9 12/14/2023   CO2 31 12/14/2023   TSH 1.62 12/14/2023    INR 1.00 06/26/2010   HGBA1C 5.5 12/14/2023         Assessment & Plan:   Physical exam: Screening blood work  ordered Exercise  none Weight  obese - encouraged weight loss Substance abuse  none   Reviewed recommended immunizations.  Up-to-date with flu and pneumonia-Them at the pharmacy-we will try to get documentation  Health Maintenance  Topic Date Due   Pneumococcal Vaccine: 50+ Years (1 of 1 - PCV) Never done   Influenza Vaccine  09/04/2023   Bone Density Scan  12/09/2023   Mammogram  12/17/2023   COVID-19 Vaccine (3 - Moderna risk series) 01/09/2024 (Originally 05/11/2019)   DTaP/Tdap/Td (4 - Td or Tdap) 12/20/2029   Colonoscopy  07/31/2032   Hepatitis C Screening  Completed   HIV Screening  Completed   Zoster Vaccines- Shingrix  Completed   Hepatitis B Vaccines 19-59 Average Risk  Aged Out   HPV VACCINES  Aged Out   Meningococcal B Vaccine  Aged Out          See Problem List for Assessment and Plan of chronic medical problems.

## 2023-12-24 NOTE — Patient Instructions (Addendum)
 Medications changes include :   None      Return in about 1 year (around 12/24/2024) for Physical Exam, Schedule DEXA-[facility].    Health Maintenance, Female Adopting a healthy lifestyle and getting preventive care are important in promoting health and wellness. Ask your health care provider about: The right schedule for you to have regular tests and exams. Things you can do on your own to prevent diseases and keep yourself healthy. What should I know about diet, weight, and exercise? Eat a healthy diet  Eat a diet that includes plenty of vegetables, fruits, low-fat dairy products, and lean protein. Do not eat a lot of foods that are high in solid fats, added sugars, or sodium. Maintain a healthy weight Body mass index (BMI) is used to identify weight problems. It estimates body fat based on height and weight. Your health care provider can help determine your BMI and help you achieve or maintain a healthy weight. Get regular exercise Get regular exercise. This is one of the most important things you can do for your health. Most adults should: Exercise for at least 150 minutes each week. The exercise should increase your heart rate and make you sweat (moderate-intensity exercise). Do strengthening exercises at least twice a week. This is in addition to the moderate-intensity exercise. Spend less time sitting. Even light physical activity can be beneficial. Watch cholesterol and blood lipids Have your blood tested for lipids and cholesterol at 63 years of age, then have this test every 5 years. Have your cholesterol levels checked more often if: Your lipid or cholesterol levels are high. You are older than 63 years of age. You are at high risk for heart disease. What should I know about cancer screening? Depending on your health history and family history, you may need to have cancer screening at various ages. This may include screening for: Breast cancer. Cervical  cancer. Colorectal cancer. Skin cancer. Lung cancer. What should I know about heart disease, diabetes, and high blood pressure? Blood pressure and heart disease High blood pressure causes heart disease and increases the risk of stroke. This is more likely to develop in people who have high blood pressure readings or are overweight. Have your blood pressure checked: Every 3-5 years if you are 62-66 years of age. Every year if you are 2 years old or older. Diabetes Have regular diabetes screenings. This checks your fasting blood sugar level. Have the screening done: Once every three years after age 36 if you are at a normal weight and have a low risk for diabetes. More often and at a younger age if you are overweight or have a high risk for diabetes. What should I know about preventing infection? Hepatitis B If you have a higher risk for hepatitis B, you should be screened for this virus. Talk with your health care provider to find out if you are at risk for hepatitis B infection. Hepatitis C Testing is recommended for: Everyone born from [birth year] through 1965. Anyone with known risk factors for hepatitis C. Sexually transmitted infections (STIs) Get screened for STIs, including gonorrhea and chlamydia, if: You are sexually active and are younger than 63 years of age. You are older than 63 years of age and your health care provider tells you that you are at risk for this type of infection. Your sexual activity has changed since you were last screened, and you are at increased risk for chlamydia or gonorrhea. Ask your health care provider  if you are at risk. Ask your health care provider about whether you are at high risk for HIV. Your health care provider may recommend a prescription medicine to help prevent HIV infection. If you choose to take medicine to prevent HIV, you should first get tested for HIV. You should then be tested every 3 months for as long as you are taking the  medicine. Pregnancy If you are about to stop having your period (premenopausal) and you may become pregnant, seek counseling before you get pregnant. Take 400 to 800 micrograms (mcg) of folic acid every day if you become pregnant. Ask for birth control (contraception) if you want to prevent pregnancy. Osteoporosis and menopause Osteoporosis is a disease in which the bones lose minerals and strength with aging. This can result in bone fractures. If you are 21 years old or older, or if you are at risk for osteoporosis and fractures, ask your health care provider if you should: Be screened for bone loss. Take a calcium or vitamin D  supplement to lower your risk of fractures. Be given hormone replacement therapy (HRT) to treat symptoms of menopause. Follow these instructions at home: Alcohol use Do not drink alcohol if: Your health care provider tells you not to drink. You are pregnant, may be pregnant, or are planning to become pregnant. If you drink alcohol: Limit how much you have to: 0-1 drink a day. Know how much alcohol is in your drink. In the [place] one drink equals one 12 oz bottle of beer (355 mL), one 5 oz glass of wine (148 mL), or one 1 oz glass of hard liquor (44 mL). Lifestyle Do not use any products that contain nicotine or tobacco. These products include cigarettes, chewing tobacco, and vaping devices, such as e-cigarettes. If you need help quitting, ask your health care provider. Do not use street drugs. Do not share needles. Ask your health care provider for help if you need support or information about quitting drugs. General instructions Schedule regular health, dental, and eye exams. Stay current with your vaccines. Tell your health care provider if: You often feel depressed. You have ever been abused or do not feel safe at home. Summary Adopting a healthy lifestyle and getting preventive care are important in promoting health and wellness. Follow your health care  provider's instructions about healthy diet, exercising, and getting tested or screened for diseases. Follow your health care provider's instructions on monitoring your cholesterol and blood pressure. This information is not intended to replace advice given to you by your health care provider. Make sure you discuss any questions you have with your health care provider. Document Revised: 06/11/2020 Document Reviewed: 06/11/2020 Elsevier Patient Education  2024 [employer].

## 2023-12-25 ENCOUNTER — Ambulatory Visit: Payer: Managed Care, Other (non HMO) | Admitting: Internal Medicine

## 2023-12-25 VITALS — BP 130/82 | HR 72 | Temp 98.0°F | Ht 63.5 in | Wt 207.0 lb

## 2023-12-25 DIAGNOSIS — M85852 Other specified disorders of bone density and structure, left thigh: Secondary | ICD-10-CM

## 2023-12-25 DIAGNOSIS — F419 Anxiety disorder, unspecified: Secondary | ICD-10-CM

## 2023-12-25 DIAGNOSIS — K219 Gastro-esophageal reflux disease without esophagitis: Secondary | ICD-10-CM | POA: Diagnosis not present

## 2023-12-25 DIAGNOSIS — B009 Herpesviral infection, unspecified: Secondary | ICD-10-CM

## 2023-12-25 DIAGNOSIS — R7303 Prediabetes: Secondary | ICD-10-CM | POA: Diagnosis not present

## 2023-12-25 DIAGNOSIS — Z23 Encounter for immunization: Secondary | ICD-10-CM

## 2023-12-25 DIAGNOSIS — Z6836 Body mass index (BMI) 36.0-36.9, adult: Secondary | ICD-10-CM | POA: Insufficient documentation

## 2023-12-25 DIAGNOSIS — M62830 Muscle spasm of back: Secondary | ICD-10-CM | POA: Insufficient documentation

## 2023-12-25 DIAGNOSIS — Z0000 Encounter for general adult medical examination without abnormal findings: Secondary | ICD-10-CM | POA: Diagnosis not present

## 2023-12-25 DIAGNOSIS — E669 Obesity, unspecified: Secondary | ICD-10-CM

## 2023-12-25 DIAGNOSIS — E559 Vitamin D deficiency, unspecified: Secondary | ICD-10-CM

## 2023-12-25 DIAGNOSIS — E2839 Other primary ovarian failure: Secondary | ICD-10-CM

## 2023-12-25 DIAGNOSIS — E7800 Pure hypercholesterolemia, unspecified: Secondary | ICD-10-CM

## 2023-12-25 MED ORDER — ESOMEPRAZOLE MAGNESIUM 40 MG PO CPDR: 40.0000 mg | DELAYED_RELEASE_CAPSULE | Freq: Every day | ORAL | 3 refills | Status: AC

## 2023-12-25 MED ORDER — BACLOFEN 10 MG PO TABS: 10.0000 mg | ORAL_TABLET | Freq: Three times a day (TID) | ORAL | 0 refills | Status: AC

## 2023-12-25 MED ORDER — VALACYCLOVIR HCL 500 MG PO TABS: 500.0000 mg | ORAL_TABLET | Freq: Two times a day (BID) | ORAL | 2 refills | Status: AC | PRN

## 2023-12-25 MED ORDER — PRAVASTATIN SODIUM 20 MG PO TABS: 20.0000 mg | ORAL_TABLET | Freq: Every day | ORAL | 2 refills | Status: AC

## 2023-12-25 NOTE — Assessment & Plan Note (Addendum)
 Chronic BMI 36.09 Discussed the importance of weight loss  Not currently exercising - stressed regular exercise

## 2023-12-25 NOTE — Assessment & Plan Note (Addendum)
 Chronic Intermittent outbreaks Continue valrex as needed

## 2023-12-25 NOTE — Assessment & Plan Note (Signed)
 Chronic Taking vitamin D daily Vitamin d level is good

## 2023-12-25 NOTE — Assessment & Plan Note (Addendum)
 Chronic GERD controlled Continue Nexium  40 mg daily GERD diet, weight loss

## 2023-12-25 NOTE — Assessment & Plan Note (Addendum)
 Chronic Management per psychiatry On alprazolam  as needed, Lexapro  20 mg daily Fairly controlled-has had some increased family stresses which has been difficult

## 2023-12-25 NOTE — Assessment & Plan Note (Signed)
 Chronic Lab Results  Component Value Date   HGBA1C 5.5 12/14/2023  Sugars improved and have been in the normal range Low sugar / carb diet Stressed regular exercise

## 2023-12-25 NOTE — Assessment & Plan Note (Signed)
 Chronic History of osteopenia Last DEXA 2020 was normal DEXA due-ordered Continue calcium and vitamin D  Urged regular exercise

## 2023-12-25 NOTE — Assessment & Plan Note (Signed)
 Chronic Lab Results  Component Value Date   LDLCALC 94 12/14/2023    Regular exercise and healthy diet encouraged Labs reviewed with her Continue pravastatin  20 mg daily

## 2023-12-25 NOTE — Assessment & Plan Note (Addendum)
 Chronic Intermittent Uses baclofen  10 mg 3 times daily as needed after certain activities-continue-refill sent to pharmacy

## 2023-12-28 NOTE — Addendum Note (Signed)
 Addended by: CLAUDENE TOBIAS PARAS on: 12/28/2023 07:59 AM   Modules accepted: Orders

## 2024-01-07 LAB — HM MAMMOGRAPHY

## 2024-02-10 ENCOUNTER — Ambulatory Visit (INDEPENDENT_AMBULATORY_CARE_PROVIDER_SITE_OTHER)
Admission: RE | Admit: 2024-02-10 | Discharge: 2024-02-10 | Disposition: A | Discharge status: Home / Self Care | Source: Ambulatory Visit | Attending: Internal Medicine | Admitting: Internal Medicine

## 2024-02-10 DIAGNOSIS — M85852 Other specified disorders of bone density and structure, left thigh: Secondary | ICD-10-CM | POA: Diagnosis not present

## 2024-02-10 DIAGNOSIS — E2839 Other primary ovarian failure: Secondary | ICD-10-CM

## 2024-02-11 ENCOUNTER — Ambulatory Visit: Payer: Self-pay | Admitting: Internal Medicine

## 2024-07-19 ENCOUNTER — Ambulatory Visit: Admitting: Adult Health

## 2025-01-04 ENCOUNTER — Encounter: Admitting: Internal Medicine
